# Patient Record
Sex: Male | Born: 1935 | Race: White | Hispanic: No | Marital: Married | Smoking: Never smoker
Health system: Southern US, Community
[De-identification: ages and names within clinical notes are randomized; demographics above are authoritative.]

## PROBLEM LIST (undated history)

## (undated) DIAGNOSIS — C61 Malignant neoplasm of prostate: Secondary | ICD-10-CM

## (undated) DIAGNOSIS — I519 Heart disease, unspecified: Secondary | ICD-10-CM

## (undated) DIAGNOSIS — E785 Hyperlipidemia, unspecified: Secondary | ICD-10-CM

## (undated) DIAGNOSIS — I4891 Unspecified atrial fibrillation: Secondary | ICD-10-CM

## (undated) DIAGNOSIS — I2510 Atherosclerotic heart disease of native coronary artery without angina pectoris: Secondary | ICD-10-CM

## (undated) DIAGNOSIS — I10 Essential (primary) hypertension: Secondary | ICD-10-CM

## (undated) DIAGNOSIS — I219 Acute myocardial infarction, unspecified: Secondary | ICD-10-CM

## (undated) DIAGNOSIS — M8580 Other specified disorders of bone density and structure, unspecified site: Secondary | ICD-10-CM

## (undated) DIAGNOSIS — Z923 Personal history of irradiation: Secondary | ICD-10-CM

## (undated) HISTORY — PX: CARDIAC CATHETERIZATION: SHX172

## (undated) HISTORY — DX: Heart disease, unspecified: I51.9

## (undated) HISTORY — DX: Hyperlipidemia, unspecified: E78.5

## (undated) HISTORY — DX: Malignant neoplasm of prostate: C61

## (undated) HISTORY — DX: Essential (primary) hypertension: I10

## (undated) HISTORY — DX: Unspecified atrial fibrillation: I48.91

## (undated) HISTORY — DX: Atherosclerotic heart disease of native coronary artery without angina pectoris: I25.10

## (undated) HISTORY — PX: HERNIA REPAIR: SHX51

---

## 1996-12-27 DIAGNOSIS — I219 Acute myocardial infarction, unspecified: Secondary | ICD-10-CM

## 1996-12-27 HISTORY — DX: Acute myocardial infarction, unspecified: I21.9

## 2001-04-05 ENCOUNTER — Other Ambulatory Visit: Admission: RE | Admit: 2001-04-05 | Discharge: 2001-04-05 | Payer: Self-pay | Admitting: Urology

## 2008-09-25 HISTORY — PX: PROSTATE BIOPSY: SHX241

## 2008-10-17 ENCOUNTER — Ambulatory Visit (HOSPITAL_COMMUNITY): Admission: RE | Admit: 2008-10-17 | Discharge: 2008-10-17 | Payer: Self-pay | Admitting: Urology

## 2008-11-01 ENCOUNTER — Inpatient Hospital Stay (HOSPITAL_COMMUNITY): Admission: EM | Admit: 2008-11-01 | Discharge: 2008-11-04 | Payer: Self-pay | Admitting: Emergency Medicine

## 2008-11-01 ENCOUNTER — Ambulatory Visit: Payer: Self-pay | Admitting: Internal Medicine

## 2008-11-02 ENCOUNTER — Encounter (INDEPENDENT_AMBULATORY_CARE_PROVIDER_SITE_OTHER): Payer: Self-pay | Admitting: Cardiology

## 2009-05-05 ENCOUNTER — Ambulatory Visit (HOSPITAL_BASED_OUTPATIENT_CLINIC_OR_DEPARTMENT_OTHER): Admission: RE | Admit: 2009-05-05 | Discharge: 2009-05-05 | Payer: Self-pay | Admitting: Urology

## 2009-05-05 DIAGNOSIS — C61 Malignant neoplasm of prostate: Secondary | ICD-10-CM

## 2009-05-05 HISTORY — DX: Malignant neoplasm of prostate: C61

## 2009-05-05 HISTORY — PX: PROSTATE CRYOABLATION: SUR358

## 2010-12-03 ENCOUNTER — Ambulatory Visit: Payer: Self-pay | Admitting: Cardiology

## 2011-04-06 LAB — POCT I-STAT 4, (NA,K, GLUC, HGB,HCT)
Glucose, Bld: 92 mg/dL (ref 70–99)
HCT: 53 % — ABNORMAL HIGH (ref 39.0–52.0)
Potassium: 5 mEq/L (ref 3.5–5.1)

## 2011-05-11 NOTE — Op Note (Signed)
NAMEJANDIEL, [name]               ACCOUNT NO.:  [id number]   MEDICAL RECORD NO.:  [id number]          PATIENT TYPE:  AMB   LOCATION:  [facility]                         FACILITY:  [facility]   PHYSICIAN:  Sigmund I. Patsi Sears, M.D.DATE OF BIRTH:  1933-06-02   DATE OF PROCEDURE:  05/05/2009  DATE OF DISCHARGE:                               OPERATIVE REPORT   PREOPERATIVE DIAGNOSIS:  T1C adenocarcinoma of the prostate.   POSTOPERATIVE DIAGNOSIS:  T1C adenocarcinoma of the prostate.   OPERATION:  Cryotherapy of the prostate.   SURGEON:  S. Patsi Sears, M.D.   ANESTHESIA:  General LMA.   PREPARATION:  After appropriate preanesthesia, the patient is brought to  the operating room and placed on the operating room dorsal supine  position where general LMA anesthesia was induced.  He was then replaced  in the dorsal lithotomy where the perineum was prepped with Betadine  solution and draped in the usual fashion.   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  Biopsy  showed Gleason 7 adenocarcinoma of the prostate on multiple sites on the  left side.  Evaluation was negative for metastatic disease, but the  patient does have osteopenia, 3 cm abdominal aortic aneurysm, and  erectile dysfunction.  The patient had an MI in November with stent and  was on Coumadin, aspirin and Plavix.  He has recently been cleared by  Dr. Swaziland for general anesthesia for cryotherapy.   PROCEDURE:  A Foley catheter was placed.  Transrectal ultrasound was  accomplished and showed approximately 30 gram prostate.  Cryotherapy  probes were placed in five separate rows.  Following placement of the  cryotherapy probes, as well as Denonvilliers fascia probe, and sphincter  probe, cystoscopy was accomplished, and found the sphincter probe to be  in the urethra.  This was backed up outside the urethra.  A guidewire  was placed in the  bladder, and the warming device was placed through the  urethra into the bladder under fluoroscopic control.   The patient underwent two freeze thaw cycles.  The first thaw cycle was  active, and the second thaw cycle was passive.  At the end of the  procedure, the patient had been had B & O suppository placed.  The  warming device was left for 20 minutes after the procedure was finished,  and then was removed so the Foley catheter could be placed.  The patient  will be discharged with the catheter.  He is given IV Toradol prior to  awakening.  The patient will be restarted on his Plavix and aspirin  postoperatively when stable and he was taken to the recovery room in  good condition.     Sigmund I. Patsi Sears, M.D.  Electronically Signed    SIT/MEDQ  D:  05/05/2009  T:  05/05/2009  Job:  [id number]

## 2011-05-11 NOTE — Discharge Summary (Signed)
[name], [name]               ACCOUNT NO.:  [id number]   MEDICAL RECORD NO.:  [id number]          PATIENT TYPE:  INP   LOCATION:  2030                         FACILITY:  [facility]   PHYSICIAN:  Elmore Guise., M.D.DATE OF BIRTH:  06-02-1933   DATE OF ADMISSION:  11/01/2008  DATE OF DISCHARGE:  11/04/2008                               DISCHARGE SUMMARY   DISCHARGE DIAGNOSES:  1. Anterior myocardial infarction, status post emergent percutaneous      coronary intervention of the mid left anterior descending.  2. History of hypertension.  3. History of dyslipidemia.  4. Recently diagnosed prostate cancer.   HISTORY OF PRESENT ILLNESS:  [name] is a very pleasant 77 year old  white male who presented to the hospital with an anterior MI.  He was  taken emergently to the cath lab for treatment.   HOSPITAL COURSE:  The patient's hospital course was uncomplicated.  He  did have successful intervention of his mid LAD.  He tolerated that  procedure well.  He did have a postprocedure echo, which either showed  stunning or possible scar of the mid and distal anteroseptal and  periapical wall with akinesis noted in these areas.  His blood pressure  remained well controlled after 36 hours in the Coronary Care Unit.  He  was sent to the telemetry floor for further evaluation.  He has done  well.  He has been up and ambulatory without any problems.  No further  chest pain or shortness of breath.  His peak CPK was 3463 with an MB of  240.  There was no troponin at that drawl.  His next CPK was 1480 with  an MB of 61 and troponin of 50.48.  His cholesterol was 121 with an LDL  of 53, HDL of 57, and triglycerides of 121.  On discharge, his BUN and  creatinine were 13 and 0.8 with potassium level of 3.7.  his white count  was 7.5 with a hemoglobin of 12.9, and platelet count of 108.  The  patient seems to have been recovering very well.   He will be discharged home today to continue the  following medications:  1. Aspirin 325 mg daily.  2. Plavix 75 mg daily.  3. Metoprolol 50 mg half tablet twice daily.  4. Simvastatin 40 mg daily.  5. Coumadin 3 mg daily (the patient is to come by the office for      samples).  This was started because of his moderate size anterior      infarct with akinesis noted on his echo.  I did tell him he likely      would not need this long term.  6. I encouraged him to stop his amlodipine.  He has been off that with      good blood pressure control with blood pressure ranging from 100-      115 over 60-70.  He is to come by the office for samples.   He will set up appointment with Dr. Swaziland in 1 week for PT/INR as well  as post-hospital visit.  I did  discuss that he would likely need to  reschedule his upcoming prostate procedure because of his heart issues.  He does need to be on Plavix at least to complete 1 year.  At this time,  I anticipate him being on Coumadin anywhere from 4 weeks to 3  months.  This will be determined by Dr. Swaziland.  Unless he has any  further problems, he will be discharged home today to continue postcath  restrictions, and he was not started on an ACE inhibitor because of  history of hyperkalemia, on ACE inhibitor in the past.      Elmore Guise., M.D.  Electronically Signed     TWK/MEDQ  D:  11/04/2008  T:  11/04/2008  Job:  [id number]   cc:   Vonzell Schlatter. Patsi Sears, M.D.

## 2011-05-11 NOTE — Cardiovascular Report (Signed)
[name], [name]               ACCOUNT NO.:  [id number]   MEDICAL RECORD NO.:  [id number]          PATIENT TYPE:  INP   LOCATION:  2902                         FACILITY:  [facility]   PHYSICIAN:  Verne Carrow, MDDATE OF BIRTH:  12/05/1933   DATE OF PROCEDURE:  11/01/2008  DATE OF DISCHARGE:                            CARDIAC CATHETERIZATION   PRIMARY CARDIOLOGIST:  Peter M. Swaziland, M.D.   PROCEDURE PERFORMED:  1. Left heart catheterization.  2. Selective coronary angiography.  3. Measurement of left ventricular pressures.  4. Percutaneous coronary intervention with thrombectomy of the totally      occluded mid left anterior descending coronary artery followed by      balloon angioplasty and placement of a Promus drug-eluting stent in      the mid left anterior descending coronary artery.  5. Placement of an Angio-Seal femoral artery closure device in the      right femoral artery.   OPERATOR:  Verne Carrow, MD.   INDICATION:  Anterior ST elevation myocardial infarction.   FINDINGS:  1. The left main coronary artery bifurcates  into the circumflex, a      ramus intermedius, and the LAD.  There was a 40%-50% stenosis in      the distal portion of the left main coronary artery.  2. The left anterior descending has a 40% stenosis in the proximal      portion followed by 100% mid occlusion that is most likely      secondary to thrombus burden and a tight stenotic lesion.  There      are several large septal perforators that arise prior to the 100%      occlusion.  There is also a very high diagonal branch that most      likely represents a ramus intermedius.  This has serial 30% lesions      down the length of the vessel.  This is a moderate-sized vessel.  3. The circumflex coronary artery has a 50%-60% proximal stenosis as      well as a 40% stenosis in the mid portion of the circumflex.  This      gives off 3 small obtuse marginal branches.  The first obtuse  marginal is a small vessel that has a 30%-40% ostial stenosis.  4. The right coronary artery has a proximal 30% stenosis and a plaque      noted in the mid portion.  This bifurcates into the PDA and a      posterolateral branch.  There is an 80% stenosis noted in the      posterior descending artery.  5. No left ventricular angiogram was performed secondary to high-end      diastolic pressures.  6. Hemodynamic data, central aortic pressure 112/73, left ventricular      pressure 95/18, end diastolic pressure 28.   MEDICATIONS DURING PROCEDURE:  The patient received 325 mg of aspirin  and 600 mg of Plavix as well as 4000 units of heparin in the emergency  department.  After the diagnostic heart catheterization, we elected to  use an Angiomax drip during the  procedure.  The patient received no  sedation during the procedure.   DETAILS:  The patient was brought to the Heart Catheterization  Laboratory on an emergent basis after presenting to the emergency  department with complaints of chest pain for 4 hours.  The EKG was  consistent with an anterior ST elevation myocardial infarction.  After  signing informed consent, the right groin was prepped and draped in  sterile fashion.  1% lidocaine was used for local anesthesia.  The  modified Seldinger technique was used to insert a 6-French sheath in the  right femoral artery.  A 6-French JR4 diagnostic catheter was used to  selectively engage the right coronary artery.  The right coronary artery  was injected selectively.  At this point, a 6 Fr XB LAD 3.5 guiding  catheter was used to selectively engage the left main coronary artery.  Following the angiogram, we proceeded to move towards a percutaneous  coronary intervention.  The patient was started on an Angiomax drip.  A  Cougar intracoronary wire was passed down the length of the left  anterior descending artery to the apex.  A Fetch thrombectomy catheter  was used to extract thrombus from  the occlusion.  I then used a 2.5 x 12  mm apex balloon for predilatation of the lesion.  A 2.75 x 28 mm Promus  drug-eluting stent was then placed in the mid LAD.  A 3.0 x 15 mm  noncompliant balloon was used for postdilatation of the lesion.  The  prestenosis of the lesion was 100%; following the intervention, the  stenosis was 0%.   IMPRESSION:  1. Acute anterior myocardial infarction with 100% occlusion of the mid      left anterior descending coronary artery.  2. Nonobstructive disease noted in the other coronary arteries.  3. Successful percutaneous coronary intervention with placement of a      Promus drug-eluting stent in the mid left anterior descending      coronary artery.  4. Elevated left ventricular filling pressures.   RECOMMENDATIONS:  The patient will be admitted to the CCU and continued  on aspirin and Plavix.  I would like to finish his Angiomax bag while in  the CCU.  We did not assess his left ventricular function secondary to  the acute infarction and high filling pressures.  The patient should  have a surface echocardiogram performed tomorrow.  The care of this  patient will be transferred to [facility].      Verne Carrow, MD  Electronically Signed     CM/MEDQ  D:  11/02/2008  T:  11/02/2008  Job:  [id number]   cc:   Peter M. Swaziland, M.D.

## 2011-05-11 NOTE — H&P (Signed)
NAMEAUDREY, ELLER               ACCOUNT NO.:  [id number]   MEDICAL RECORD NO.:  [id number]          PATIENT TYPE:  INP   LOCATION:  2902                         FACILITY:  [facility]   PHYSICIAN:  Verne Carrow, MDDATE OF BIRTH:  July 19, 1933   DATE OF ADMISSION:  11/01/2008  DATE OF DISCHARGE:                              HISTORY & PHYSICAL   CHIEF COMPLAINT:  Code STEMI.   HISTORY OF PRESENT ILLNESS:  This is a 77 year old male with coronary  artery disease and previous MI on October 31, 1997, who underwent a  PTCA, did not have any stents, also has a past medical history of  hypertension, hyperlipidemia, and a recent diagnosis of prostate cancer,  who presents with right shoulder pain, diaphoresis, chest pain, and ST  elevation in leads I, aVL, V2 through V5 with reciprocal depression in  II, III, aVF and Q waves in leads III and aVF.  The patient first  experienced right shoulder pain around 2 p.m. on the date of admission  and it improved over the next couple of hours.  Then around 5 p.m., the  shoulder pain returned and also presented with a dull pressure chest  pain that persisted until around 9:30 p.m. when the patient's pain  substantially increased and the patient also developed tingling in his  right hand and diaphoresis.  The patient's family called EMS around 9:45  p.m. and the patient was brought to [facility].  The only  medication that the patient received prior to EMS transport was 1 dose  of ibuprofen around 6 p.m. at night.  The patient does not suffer from  chronic angina and had been followed by Dr. Peter Sw[place] of [facility]  [facility] and his last visit was in March 2009, at which time he had a  stress test, which was reportedly normal.   PAST MEDICAL HISTORY:  1. Coronary artery disease status post MI in 1998 with angioplasty of      one fully blocked artery and one partially blocked artery per the      patient's family record.  2. Recent  diagnosis of prostate cancer, supposed to undergo      cryoablation on November 15, 2008.  3. Hyperlipidemia.  4. Hypertension.  5. Recent diagnosis of hairline fracture at lumbar vertebrae, found on      bone scan.  Unsure of location and chronicity at this time.   MEDICATIONS:  1. P.o. Zocor 80 mg daily.  2. P.o. Norvasc 10 mg daily.  3. P.o. metoprolol 12.5 mg b.i.d.  4. P.o. aspirin 81 mg daily.   SOCIAL HISTORY:  [place]  wife and daughter.  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  He has 1 daughter, has never smoked, does not drink  alcohol, and exercises about 4 times per week.   FAMILY HISTORY:  Mother had coronary artery disease and brother had  coronary artery disease.  Otherwise, noncontributory.   REVIEW OF SYSTEMS:  As per HPI.   PHYSICAL EXAMINATION:  VITAL SIGNS:  After the catheterization, the  following vitals were taken:  Temperature 97.3, pulse is 62, respiratory  rate is 17, blood pressure 99/63, and O2 saturation 92% on room air and  97% on 2 L.   Post-Cath:  GENERAL:  No apparent distress.  CARDIOVASCULAR:  Borderline bradycardic.  Regular rhythm.  Normal S1 and  S2.  No murmurs, rubs, or gallops.  LUNGS:  Clear to auscultation bilaterally, auscultate anteriorly.  ABDOMEN:  Soft and nontender.  Normoactive bowel sounds.  EXTREMITIES:  No peripheral cyanosis, clubbing, or edema.  NEUROLOGIC:  Alert and oriented.  Cranial nerves II through XII grossly  intact.  Strength 5/5 bilateral upper and lower extremity.  HEENT:  Moist membranes mucosa.   LABORATORY DATA:  Admission CBC; white blood cell count of 7.9,  hemoglobin of 13.3, and platelets of 143.  Cardiac enzymes; initial CK  less than 1.0, initial CK-MB is 56.3, and initial troponins 0.05.   ASSESSMENT:  The patient presented with an acute anterolateral ST-  elevation myocardial infarction.  He was rushed emergently to  the Cath  Lab and underwent emergent catheterization.  A mid left anterior  descending coronary artery stenosis was found and a Promus drug eluting  stent was inserted.  Post-catheterization, the patient had good coronary  perfusion and tolerated the procedure well.  He was taken to the CCU to  be monitored closely.  The patient was chest pain free upon transfer,  and his vital signs were stable.   PLAN:  The patient will be observed in the CCU for post-MI  complications.  He will be continued on Plavix, aspirin, and a statin.  Because the patient is borderline bradycardic, a beta-blocker will not  be instituted at this time, but will be started once it is  hemodynamically safe.      Linward Foster, MD  Electronically Signed      Verne Carrow, MD  Electronically Signed    LW/MEDQ  D:  11/02/2008  T:  11/02/2008  Job:  [id number]   cc:   Verne Carrow, MD  Peter M. Sw[place]iland, M.D.

## 2011-07-07 ENCOUNTER — Other Ambulatory Visit: Payer: Self-pay | Admitting: *Deleted

## 2011-07-07 DIAGNOSIS — E785 Hyperlipidemia, unspecified: Secondary | ICD-10-CM

## 2011-07-23 ENCOUNTER — Encounter: Payer: Self-pay | Admitting: *Deleted

## 2011-07-27 ENCOUNTER — Encounter: Payer: Self-pay | Admitting: Cardiology

## 2011-07-27 ENCOUNTER — Ambulatory Visit (INDEPENDENT_AMBULATORY_CARE_PROVIDER_SITE_OTHER): Payer: Medicare Other | Admitting: Cardiology

## 2011-07-27 ENCOUNTER — Other Ambulatory Visit (INDEPENDENT_AMBULATORY_CARE_PROVIDER_SITE_OTHER): Payer: Medicare Other | Admitting: *Deleted

## 2011-07-27 DIAGNOSIS — E785 Hyperlipidemia, unspecified: Secondary | ICD-10-CM

## 2011-07-27 DIAGNOSIS — I519 Heart disease, unspecified: Secondary | ICD-10-CM | POA: Insufficient documentation

## 2011-07-27 DIAGNOSIS — I10 Essential (primary) hypertension: Secondary | ICD-10-CM

## 2011-07-27 DIAGNOSIS — I2510 Atherosclerotic heart disease of native coronary artery without angina pectoris: Secondary | ICD-10-CM

## 2011-07-27 LAB — BASIC METABOLIC PANEL
BUN: 15 mg/dL (ref 6–23)
CO2: 28 mEq/L (ref 19–32)
Chloride: 104 mEq/L (ref 96–112)
Creatinine, Ser: 0.9 mg/dL (ref 0.4–1.5)
GFR: 82.85 mL/min (ref >60.00)
Glucose, Bld: 100 mg/dL — ABNORMAL HIGH (ref 70–99)
Potassium: 5.4 mEq/L — ABNORMAL HIGH (ref 3.5–5.1)
Sodium: 143 mEq/L (ref 135–145)

## 2011-07-27 LAB — LIPID PANEL: VLDL: 15.6 mg/dL (ref 0.0–40.0)

## 2011-07-27 LAB — HEPATIC FUNCTION PANEL
ALT: 16 U/L (ref 0–53)
AST: 25 U/L (ref 0–37)
Albumin: 4.5 g/dL (ref 3.5–5.2)
Total Bilirubin: 0.8 mg/dL (ref 0.3–1.2)

## 2011-07-27 NOTE — Assessment & Plan Note (Signed)
Results of his lipid panel today are excellent. He will continue on his current therapy with simvastatin. We discussed the potential interaction with amlodipine but he has been on this therapy for a number of years and has never had any complications.

## 2011-07-27 NOTE — Progress Notes (Signed)
   [name] Date of Birth: 1933-11-06   History of Present Illness: [name] is seen today for followup. He states he has been doing very well and feels excellent. He is walking daily. He denies any symptoms of chest pain, shortness of breath, palpitations, or dizziness. He has lost 5 pounds since his last visit.  Current Outpatient Prescriptions on File Prior to Visit  Medication Sig Dispense Refill  . amLODipine (NORVASC) 10 MG tablet Take 10 mg by mouth daily.        Marland Kitchen aspirin 81 MG tablet Take 81 mg by mouth daily.        . calcium carbonate (OS-CAL) 600 MG TABS Take 600 mg by mouth 2 (two) times daily with a meal.        . Cholecalciferol (VITAMIN D3) 2000 UNITS TABS Take 2,000 Int'l Units by mouth daily.        . clopidogrel (PLAVIX) 75 MG tablet Take 75 mg by mouth daily.        . metoprolol tartrate (LOPRESSOR) 25 MG tablet Take 25 mg by mouth 2 (two) times daily.        . simvastatin (ZOCOR) 40 MG tablet Take 40 mg by mouth at bedtime.          Allergies  Allergen Reactions  . Ace Inhibitors Other (See Comments)    hyperkalemia    Past Medical History  Diagnosis Date  . Coronary artery disease     Nov 2009-MI-stenting of the mid LAD drug-eluding stent  . Hypertension   . Prostate ca     prostate cancer 11/09-tx with cryotherapy  . LV dysfunction   . Hyperlipidemia     Past Surgical History  Procedure Date  . Cardiac catheterization     Nov 2009-drug eluding stent to mid LAD  . Hernia repair     History  Smoking status  . Never Smoker   Smokeless tobacco  . Not on file    History  Alcohol Use No    Family History  Problem Relation Age of Onset  . Heart disease Mother   . Heart failure Mother   . Heart disease Brother     Review of Systems: As noted in history of present illness.  All other systems were reviewed and are negative.  Physical Exam: BP 124/80  Pulse 58  Ht 5\' 11"  (1.803 m)  Wt 173 lb 9.6 oz (78.744 kg)  BMI 24.21 kg/m2 He is  a pleasant white male in no acute distress. He is normocephalic, atraumatic. Pupils are equal round and reactive to light accommodation. Extraocular movements are full. Oropharynx is clear. Neck is supple without JVD, adenopathy, thyromegaly, or bruits. Lungs are clear. Cardiac exam reveals a regular rate and rhythm without gallop, murmur, or click. Abdomen is soft and nontender. He has no significant edema. Pedal pulses are good. He is alert and oriented x3. Cranial nerves II through XII are intact. LABORATORY DATA:   Assessment / Plan:

## 2011-07-27 NOTE — Assessment & Plan Note (Signed)
He remains asymptomatic. We will plan on scheduling him for a stress Myoview study in November. He will continue with his risk factor modification.

## 2011-07-27 NOTE — Assessment & Plan Note (Signed)
Blood pressure is well controlled on his current medical therapy. 

## 2011-07-27 NOTE — Patient Instructions (Signed)
We will call with the results of your lab work today.  Continue your current medications.  We will schedule you for a stress test in November.  I will see you again in 6 months.

## 2011-07-29 ENCOUNTER — Telehealth: Payer: Self-pay | Admitting: *Deleted

## 2011-07-29 NOTE — Telephone Encounter (Signed)
Message copied by Lorayne Bender on Thu Jul 29, 2011  9:59 AM ------      Message from: Swaziland, PETER M      Created: Tue Jul 27, 2011  7:14 PM       Chemistries are excellent except mildly elevated K+. Lipids are excellent.

## 2011-07-29 NOTE — Progress Notes (Signed)
lm

## 2011-07-29 NOTE — Telephone Encounter (Signed)
Notified of lab results. Will send copy to Dr. Zachery Dauer

## 2011-08-09 ENCOUNTER — Other Ambulatory Visit: Payer: Self-pay | Admitting: *Deleted

## 2011-08-09 MED ORDER — CLOPIDOGREL BISULFATE 75 MG PO TABS: 75.0000 mg | ORAL_TABLET | Freq: Every day | ORAL | Status: DC

## 2011-08-09 NOTE — Telephone Encounter (Signed)
escribe medication per fax request  

## 2011-09-28 LAB — DIFFERENTIAL
Basophils Absolute: 0
Basophils Relative: 0
Eosinophils Absolute: 0.1
Eosinophils Relative: 1
Lymphocytes Relative: 24
Lymphocytes Relative: 44
Lymphs Abs: 1.6
Lymphs Abs: 3.4
Monocytes Absolute: 0.9
Monocytes Relative: 11
Monocytes Relative: 12
Neutro Abs: 3.4
Neutro Abs: 4.3
Neutrophils Relative %: 43
Neutrophils Relative %: 64

## 2011-09-28 LAB — POCT CARDIAC MARKERS
CKMB, poc: <1 — ABNORMAL LOW
Myoglobin, poc: 56.3
Troponin i, poc: <0.05

## 2011-09-28 LAB — POCT I-STAT, CHEM 8
BUN: 27 — ABNORMAL HIGH
Calcium, Ion: 1.23
Chloride: 104
Creatinine, Ser: 0.7
Creatinine, Ser: 1.1
Glucose, Bld: 120 — ABNORMAL HIGH
HCT: 39
HCT: 41
Hemoglobin: 13.3
Hemoglobin: 13.9
Potassium: 3.2 — ABNORMAL LOW
Potassium: 3.6
Sodium: 120 — ABNORMAL LOW
Sodium: 140
TCO2: 19
TCO2: 27

## 2011-09-28 LAB — COMPREHENSIVE METABOLIC PANEL
ALT: 43
AST: 214 — ABNORMAL HIGH
Albumin: 3.4 — ABNORMAL LOW
Alkaline Phosphatase: 60
BUN: 11
CO2: 27
Calcium: 8.8
Chloride: 106
Creatinine, Ser: 0.82
GFR calc Af Amer: >60
GFR calc non Af Amer: >60
Glucose, Bld: 95
Potassium: 3.7
Sodium: 140
Total Bilirubin: 1
Total Protein: 6.2

## 2011-09-28 LAB — LIPID PANEL
LDL Cholesterol: 53
Total CHOL/HDL Ratio: 2.1
Triglycerides: 55
VLDL: 11

## 2011-09-28 LAB — CBC
HCT: 39
HCT: 39.4
HCT: 39.5
HCT: 40.2
Hemoglobin: 12.9 — ABNORMAL LOW
Hemoglobin: 13.2
Hemoglobin: 13.8
MCHC: 33.5
MCHC: 34.2
MCV: 97.1
MCV: 97.5
MCV: 98.3
MCV: 98.9
Platelets: 106 — ABNORMAL LOW
Platelets: 108 — ABNORMAL LOW
Platelets: 122 — ABNORMAL LOW
RBC: 4.01 — ABNORMAL LOW
RBC: 4.13 — ABNORMAL LOW
RDW: 12.9
RDW: 13.1
RDW: 13.3
WBC: 6.7
WBC: 7.5
WBC: 9.6

## 2011-09-28 LAB — BASIC METABOLIC PANEL
BUN: 13
Chloride: 108
Chloride: 108
GFR calc Af Amer: >60
GFR calc non Af Amer: >60
Glucose, Bld: 103 — ABNORMAL HIGH
Potassium: 3.7
Sodium: 141

## 2011-09-28 LAB — PROTIME-INR
INR: 1.1
Prothrombin Time: 14.4

## 2011-09-28 LAB — APTT: aPTT: 33

## 2011-09-28 LAB — CARDIAC PANEL(CRET KIN+CKTOT+MB+TROPI): Relative Index: 6.9 — ABNORMAL HIGH

## 2011-10-28 ENCOUNTER — Telehealth: Payer: Self-pay | Admitting: *Deleted

## 2011-10-28 DIAGNOSIS — I2510 Atherosclerotic heart disease of native coronary artery without angina pectoris: Secondary | ICD-10-CM

## 2011-10-28 NOTE — Telephone Encounter (Signed)
Called [name] to advise we would be scheduling his stress test. Gave instructions. Advised we would call him with the results within 2-3 days after procedure.

## 2011-11-08 ENCOUNTER — Other Ambulatory Visit: Payer: Self-pay | Admitting: *Deleted

## 2011-11-08 MED ORDER — AMLODIPINE BESYLATE 10 MG PO TABS: 10.0000 mg | ORAL_TABLET | Freq: Every day | ORAL | Status: DC

## 2011-11-08 MED ORDER — METOPROLOL TARTRATE 25 MG PO TABS: 25.0000 mg | ORAL_TABLET | Freq: Two times a day (BID) | ORAL | Status: DC

## 2011-11-08 MED ORDER — SIMVASTATIN 40 MG PO TABS: 40.0000 mg | ORAL_TABLET | Freq: Every day | ORAL | Status: DC

## 2011-11-08 NOTE — Telephone Encounter (Signed)
Requested refill on Simvastatin 40 mg; amlodipine 10 mg;metoprolol 25 mg BID. Sent to [facility]

## 2011-11-09 ENCOUNTER — Ambulatory Visit (HOSPITAL_COMMUNITY): Payer: Medicare Other | Attending: Cardiology | Admitting: Radiology

## 2011-11-09 VITALS — Ht 70.0 in | Wt 173.0 lb

## 2011-11-09 DIAGNOSIS — Z9861 Coronary angioplasty status: Secondary | ICD-10-CM | POA: Insufficient documentation

## 2011-11-09 DIAGNOSIS — I2510 Atherosclerotic heart disease of native coronary artery without angina pectoris: Secondary | ICD-10-CM

## 2011-11-09 DIAGNOSIS — E785 Hyperlipidemia, unspecified: Secondary | ICD-10-CM

## 2011-11-09 DIAGNOSIS — Z8249 Family history of ischemic heart disease and other diseases of the circulatory system: Secondary | ICD-10-CM | POA: Insufficient documentation

## 2011-11-09 DIAGNOSIS — I252 Old myocardial infarction: Secondary | ICD-10-CM | POA: Insufficient documentation

## 2011-11-09 DIAGNOSIS — I10 Essential (primary) hypertension: Secondary | ICD-10-CM | POA: Insufficient documentation

## 2011-11-09 MED ORDER — TECHNETIUM TC 99M TETROFOSMIN IV KIT
11.0000 | PACK | Freq: Once | INTRAVENOUS | Status: AC | PRN
  Administered 2011-11-09: 11 via INTRAVENOUS

## 2011-11-09 MED ORDER — TECHNETIUM TC 99M TETROFOSMIN IV KIT
33.0000 | PACK | Freq: Once | INTRAVENOUS | Status: AC | PRN
  Administered 2011-11-09: 33 via INTRAVENOUS

## 2011-11-09 NOTE — Progress Notes (Signed)
[name] 3 NUCLEAR MED [address] [place] [address] [phone]  Cardiology Nuclear Med Study  [name] is a 77 y.o. male [id number] February 08, 1933   Nuclear Med Background Indication for Stress Test:  Evaluation for Ischemia and Stent Patency History: 98 Angioplasty , 09 Echo:EF=40-45%,09 Heart Catheterization:Post Myocardial Infarction with Stent Placement of LAD and 10 Myocardial Perfusion Study:EF=56% with anterior/apical infarct and basal lateral infarct. Cardiac Risk Factors: Family History - CAD, Hypertension and Lipids  Symptoms:  none   Nuclear Pre-Procedure Caffeine/Decaff Intake:  None NPO After: 7:00pm   Lungs:  clear IV 0.9% NS with Angio Cath:  22g  IV Site: R Hand  IV Started by:  Miguel Parsons, RN  Chest Size (in):  40 Cup Size: n/a  Height: 5\' 10"  (1.778 m)  Weight:  173 lb (78.472 kg)  BMI:  Body mass index is 24.82 kg/(m^2). Tech Comments:  Lopressor held x 48 hrs    Nuclear Med Study 1 or 2 day study: 1 day  Stress Test Type:  Stress  Reading MD: Olga Millers, MD  Order Authorizing Provider:  Vonna Drafts  Resting Radionuclide: Technetium 92m Tetrofosmin  Resting Radionuclide Dose: 11.0 mCi   Stress Radionuclide:  Technetium 97m Tetrofosmin  Stress Radionuclide Dose: 33.0 mCi           Stress Protocol Rest HR: 66 Stress HR: 164  Rest BP: 120/91 Stress BP: 167/85  Exercise Time (min): 8:00 METS: 10.10   Predicted Max HR: 144 bpm % Max HR: 113.89 bpm Rate Pressure Product: 21308   Dose of Adenosine (mg):  n/a Dose of Lexiscan: n/a mg  Dose of Atropine (mg): n/a Dose of Dobutamine: n/a mcg/kg/min (at max HR)  Stress Test Technologist: Miguel Parsons, RN  Nuclear Technologist:  [name], CNMT     Rest Procedure:  Myocardial perfusion imaging was performed at rest 45 minutes following the intravenous administration of Technetium 10m Tetrofosmin. Rest ECG: NSR  Stress Procedure:  The patient  exercised for 8:00.  The patient stopped due to fatigue and target heart rate achieved and denied any chest pain.  There were no significant ST-T wave changes. Patient had frequent PAC's and PVC's. Technetium 62m Tetrofosmin was injected at peak exercise and myocardial perfusion imaging was performed after a brief delay. Stress ECG: No significant ST segment change suggestive of ischemia.  QPS Raw Data Images:  Acquisition technically good; mild LVE. Stress Images:  There is decreased uptake in the anteroseptal wall and apex. Rest Images:  There is decreased uptake in the anteroseptal wall and apex. Subtraction (SDS):  There is a fixed defect that is most consistent with a previous infarction. Transient Ischemic Dilatation (Normal <1.22):  0.99 Lung/Heart Ratio (Normal <0.45):  0.26  Quantitative Gated Spect Images QGS EDV:  134 ml QGS ESV:  74 ml QGS cine images:  Anteroseptal and apical hypokinesis. QGS EF: 44%  Impression Exercise Capacity:  Fair exercise capacity. BP Response:  Normal blood pressure response. Clinical Symptoms:  No chest pain. ECG Impression:  No significant ST segment change suggestive of ischemia. Comparison with Prior Nuclear Study: No images to compare  Overall Impression:  Abnormal stress nuclear study with a large, fixed anteroseptal and apical defect consistent with prior infarct; no ischemia.   Olga Millers

## 2012-04-17 ENCOUNTER — Other Ambulatory Visit: Payer: Self-pay | Admitting: Cardiology

## 2012-04-17 MED ORDER — CLOPIDOGREL BISULFATE 75 MG PO TABS: 75.0000 mg | ORAL_TABLET | Freq: Every day | ORAL | Status: DC

## 2012-11-10 ENCOUNTER — Encounter: Payer: Self-pay | Admitting: Cardiology

## 2012-11-10 ENCOUNTER — Ambulatory Visit (INDEPENDENT_AMBULATORY_CARE_PROVIDER_SITE_OTHER): Payer: Medicare Other | Admitting: Cardiology

## 2012-11-10 VITALS — BP 118/83 | HR 56 | Ht 61.2 in | Wt 173.8 lb

## 2012-11-10 DIAGNOSIS — E785 Hyperlipidemia, unspecified: Secondary | ICD-10-CM

## 2012-11-10 DIAGNOSIS — I519 Heart disease, unspecified: Secondary | ICD-10-CM

## 2012-11-10 DIAGNOSIS — I10 Essential (primary) hypertension: Secondary | ICD-10-CM

## 2012-11-10 DIAGNOSIS — I2510 Atherosclerotic heart disease of native coronary artery without angina pectoris: Secondary | ICD-10-CM

## 2012-11-10 LAB — BASIC METABOLIC PANEL
BUN: 15 mg/dL (ref 6–23)
CO2: 29 mEq/L (ref 19–32)
Chloride: 106 mEq/L (ref 96–112)
GFR: 82.57 mL/min (ref >60.00)
Glucose, Bld: 116 mg/dL — ABNORMAL HIGH (ref 70–99)
Potassium: 4.2 mEq/L (ref 3.5–5.1)
Sodium: 141 mEq/L (ref 135–145)

## 2012-11-10 LAB — HEPATIC FUNCTION PANEL
AST: 23 U/L (ref 0–37)
Albumin: 4.3 g/dL (ref 3.5–5.2)
Total Protein: 7.4 g/dL (ref 6.0–8.3)

## 2012-11-10 LAB — LIPID PANEL
Cholesterol: 153 mg/dL (ref 0–200)
VLDL: 14 mg/dL (ref 0.0–40.0)

## 2012-11-10 MED ORDER — SIMVASTATIN 40 MG PO TABS: 40.0000 mg | ORAL_TABLET | Freq: Every day | ORAL | Status: DC

## 2012-11-10 MED ORDER — CLOPIDOGREL BISULFATE 75 MG PO TABS: 75.0000 mg | ORAL_TABLET | Freq: Every day | ORAL | Status: DC

## 2012-11-10 MED ORDER — METOPROLOL TARTRATE 25 MG PO TABS: 25.0000 mg | ORAL_TABLET | Freq: Two times a day (BID) | ORAL | Status: DC

## 2012-11-10 MED ORDER — AMLODIPINE BESYLATE 10 MG PO TABS: 10.0000 mg | ORAL_TABLET | Freq: Every day | ORAL | Status: DC

## 2012-11-10 NOTE — Progress Notes (Signed)
[name] Date of Birth: Aug 03, 1933   History of Present Illness: [name] is seen today for followup. He has a history of an anterior myocardial infarction in November 2009. This was treated with a drug-eluting stent to the mid LAD. He continues to do very well. Denies any symptoms of chest pain, shortness of breath, or palpitations. He is walking for a half miles 5-6 days a week. He's had no other new health concerns. He had a stress Myoview study November 2012. He is able to walk for 8 minutes. He had no clinical symptoms. Images showed a fixed anterior septal and apical defect. There was no ischemia. Ejection fraction was 44%.  Current Outpatient Prescriptions on File Prior to Visit  Medication Sig Dispense Refill  . amLODipine (NORVASC) 10 MG tablet Take 1 tablet (10 mg total) by mouth daily.  90 tablet  3  . aspirin 81 MG tablet Take 81 mg by mouth daily.        . calcium carbonate (OS-CAL) 600 MG TABS Take 600 mg by mouth 2 (two) times daily with a meal.        . Cholecalciferol (VITAMIN D3) 2000 UNITS TABS Take 2,000 Int'l Units by mouth daily.        . clopidogrel (PLAVIX) 75 MG tablet Take 1 tablet (75 mg total) by mouth daily.  90 tablet  0  . metoprolol tartrate (LOPRESSOR) 25 MG tablet Take 1 tablet (25 mg total) by mouth 2 (two) times daily.  180 tablet  3  . simvastatin (ZOCOR) 40 MG tablet Take 1 tablet (40 mg total) by mouth at bedtime.  90 tablet  3    Allergies  Allergen Reactions  . Ace Inhibitors Other (See Comments)    hyperkalemia    Past Medical History  Diagnosis Date  . Coronary artery disease     Nov 2009-MI-stenting of the mid LAD drug-eluding stent  . Hypertension   . Prostate ca     prostate cancer 11/09-tx with cryotherapy  . LV dysfunction   . Hyperlipidemia     Past Surgical History  Procedure Date  . Cardiac catheterization     Nov 2009-drug eluding stent to mid LAD  . Hernia repair     History  Smoking status  . Never Smoker     Smokeless tobacco  . Not on file    History  Alcohol Use No    Family History  Problem Relation Age of Onset  . Heart disease Mother   . Heart failure Mother   . Heart disease Brother     Review of Systems: As noted in history of present illness.  All other systems were reviewed and are negative.  Physical Exam: BP 118/83  Pulse 56  Ht 5' 1.2" (1.554 m)  Wt 78.835 kg (173 lb 12.8 oz)  BMI 32.62 kg/m2 He is a pleasant white male in no acute distress. He is normocephalic, atraumatic. Pupils are equal round and reactive to light accommodation. Extraocular movements are full. Oropharynx is clear. Neck is supple without JVD, adenopathy, thyromegaly, or bruits. Lungs are clear. Cardiac exam reveals a regular rate and rhythm without gallop, murmur, or click. Abdomen is soft and nontender. No masses or bruits. He has no significant edema. Pedal pulses are good. He is alert and oriented x3. Cranial nerves II through XII are intact.  LABORATORY DATA: ECG today demonstrates sinus bradycardia with a rate of 54 beats per minute. There is left axis deviation. There is septal  infarct, old. Fasting blood work is pending today.  Assessment / Plan: 1. Coronary disease with remote anterior myocardial infarction treated with drug-eluting stent to the LAD. Asymptomatic. Myoview study one year ago showed no ischemia. Will continue medical therapy with aspirin, metoprolol, Plavix, and statin therapy. 2. Hypertension, controlled. 3. Hypercholesterolemia, on Zocor. We'll check fasting lipids and chemistries today. 4. Left ventricular dysfunction. Ejection fraction 44%. Unchanged from 2009.

## 2012-11-10 NOTE — Patient Instructions (Signed)
We will check lab work today and call with the results.  Continue your current therapy.  I will see you again in one year.

## 2013-11-14 ENCOUNTER — Telehealth: Payer: Self-pay | Admitting: Cardiology

## 2013-11-14 ENCOUNTER — Ambulatory Visit (INDEPENDENT_AMBULATORY_CARE_PROVIDER_SITE_OTHER): Payer: Medicare Other | Admitting: Cardiology

## 2013-11-14 ENCOUNTER — Telehealth: Payer: Self-pay | Admitting: *Deleted

## 2013-11-14 ENCOUNTER — Encounter: Payer: Self-pay | Admitting: Cardiology

## 2013-11-14 VITALS — BP 120/79 | HR 69 | Ht 68.0 in | Wt 165.0 lb

## 2013-11-14 DIAGNOSIS — I2510 Atherosclerotic heart disease of native coronary artery without angina pectoris: Secondary | ICD-10-CM

## 2013-11-14 DIAGNOSIS — E785 Hyperlipidemia, unspecified: Secondary | ICD-10-CM

## 2013-11-14 DIAGNOSIS — I10 Essential (primary) hypertension: Secondary | ICD-10-CM

## 2013-11-14 DIAGNOSIS — I4891 Unspecified atrial fibrillation: Secondary | ICD-10-CM

## 2013-11-14 LAB — HEPATIC FUNCTION PANEL
AST: 25 U/L (ref 0–37)
Albumin: 4.2 g/dL (ref 3.5–5.2)
Alkaline Phosphatase: 67 U/L (ref 39–117)
Bilirubin, Direct: 0.2 mg/dL (ref 0.0–0.3)
Total Protein: 7.2 g/dL (ref 6.0–8.3)

## 2013-11-14 LAB — CBC WITH DIFFERENTIAL/PLATELET
Basophils Absolute: 0 10*3/uL (ref 0.0–0.1)
Eosinophils Absolute: 0 10*3/uL (ref 0.0–0.7)
HCT: 49.1 % (ref 39.0–52.0)
Hemoglobin: 16.2 g/dL (ref 13.0–17.0)
Lymphs Abs: 1.8 10*3/uL (ref 0.7–4.0)
MCHC: 33.1 g/dL (ref 30.0–36.0)
MCV: 96.4 fl (ref 78.0–100.0)
Monocytes Absolute: 0.6 10*3/uL (ref 0.1–1.0)
Neutro Abs: 5.7 10*3/uL (ref 1.4–7.7)
Platelets: 180 10*3/uL (ref 150.0–400.0)
RDW: 14.1 % (ref 11.5–14.6)

## 2013-11-14 LAB — BASIC METABOLIC PANEL
Calcium: 10.2 mg/dL (ref 8.4–10.5)
Creatinine, Ser: 1 mg/dL (ref 0.4–1.5)
GFR: 79.42 mL/min (ref >60.00)
Sodium: 140 mEq/L (ref 135–145)

## 2013-11-14 LAB — LIPID PANEL
Cholesterol: 163 mg/dL (ref 0–200)
LDL Cholesterol: 77 mg/dL (ref 0–99)
Triglycerides: 107 mg/dL (ref 0.0–149.0)

## 2013-11-14 MED ORDER — AMLODIPINE BESYLATE 10 MG PO TABS: 10.0000 mg | ORAL_TABLET | Freq: Every day | ORAL | Status: DC

## 2013-11-14 MED ORDER — APIXABAN 5 MG PO TABS: 5.0000 mg | ORAL_TABLET | Freq: Two times a day (BID) | ORAL | Status: DC

## 2013-11-14 MED ORDER — METOPROLOL TARTRATE 50 MG PO TABS: 25.0000 mg | ORAL_TABLET | Freq: Two times a day (BID) | ORAL | Status: DC

## 2013-11-14 MED ORDER — SIMVASTATIN 40 MG PO TABS: 40.0000 mg | ORAL_TABLET | Freq: Every day | ORAL | Status: DC

## 2013-11-14 NOTE — Patient Instructions (Signed)
We will check lab work today and schedule you for an Echocardiogram  Start Eliquis 5 mg twice a day.  Stop ASA and Plavix.  I will see you in 3 months.

## 2013-11-14 NOTE — Progress Notes (Signed)
[name] Date of Birth: [birth year]   History of Present Illness: [name] is seen today for followup. He has a history of an anterior myocardial infarction in November 2009. This was treated with a drug-eluting stent to the mid LAD.  He had a stress Myoview study November 2012. He is able to walk for 8 minutes. He had no clinical symptoms. Images showed a fixed anterior septal and apical defect. There was no ischemia. Ejection fraction was 44%. On followup today he reports he is doing very well. He has had some pain in his right hip which has slowed his exercise but he is still exercising 4 miles a day. He denies any palpitations, dyspnea, fatigue, or chest pain.  Current Outpatient Prescriptions on File Prior to Visit  Medication Sig Dispense Refill  . calcium carbonate (OS-CAL) 600 MG TABS Take 600 mg by mouth 2 (two) times daily with a meal.        . Cholecalciferol (VITAMIN D3) 2000 UNITS TABS Take 2,000 Int'l Units by mouth daily.         No current facility-administered medications on file prior to visit.    Allergies  Allergen Reactions  . Ace Inhibitors Other (See Comments)    hyperkalemia    Past Medical History  Diagnosis Date  . Coronary artery disease     Nov 2009-MI-stenting of the mid LAD drug-eluding stent  . Hypertension   . Prostate ca     prostate cancer 11/09-tx with cryotherapy  . LV dysfunction   . Hyperlipidemia   . Atrial fibrillation     Past Surgical History  Procedure Laterality Date  . Cardiac catheterization      Nov 2009-drug eluding stent to mid LAD  . Hernia repair      History  Smoking status  . Never Smoker   Smokeless tobacco  . Not on file    History  Alcohol Use No    Family History  Problem Relation Age of Onset  . Heart disease Mother   . Heart failure Mother   . Heart disease Brother     Review of Systems: As noted in history of present illness.  All other systems were reviewed and are negative.  Physical  Exam: BP 120/79  Pulse 69  Ht 5\' 8"  (1.727 m)  Wt 165 lb (74.844 kg)  BMI 25.09 kg/m2 He is a pleasant white male in no acute distress. He is normocephalic, atraumatic. Pupils are equal round and reactive to light accommodation. Extraocular movements are full. Oropharynx is clear. Neck is supple without JVD, adenopathy, thyromegaly, or bruits. Lungs are clear. Cardiac exam reveals an irregular rate and rhythm without gallop, murmur, or click. Abdomen is soft and nontender. No masses or bruits. He has no significant edema. Pedal pulses are good. He is alert and oriented x3. Cranial nerves II through XII are intact.  LABORATORY DATA: ECG today demonstrates atrial fibrillation with controlled ventricular response of 69 beats per minute. There is left axis deviation with low voltage.  Assessment / Plan: 1. Coronary disease with remote anterior myocardial infarction treated with drug-eluting stent to the LAD. Asymptomatic. Myoview study 11/12 showed no ischemia. Will continue medical therapy with  metoprolol and statin therapy. 2. Hypertension, controlled. 3. Hypercholesterolemia, on Zocor. We'll check fasting lipids and chemistries today. 4. Left ventricular dysfunction. Ejection fraction 44%. Unchanged from 2009.  5. Atrial fibrillation. Rate is controlled on low-dose metoprolol. Patient is completely asymptomatic. [place] score is 3. I recommended anticoagulation. We  will place him on Eliquis 5 mg twice a day. We will stop his aspirin and Plavix. We will check chemistries and TSH today. We will schedule him for an echocardiogram. Given his lack of symptoms I recommended a rate control strategy only.

## 2013-11-14 NOTE — Telephone Encounter (Signed)
New messge     Saw Dr Swaziland this am---want him to know eliquis is not covered by insurance.  What are the other 2 choices so that he can have his ins see if they will cover it?

## 2013-11-14 NOTE — Telephone Encounter (Signed)
Returned call to patient he stated Eliquis will be too expensive.Will check with Dr.Jordan 11/15/13 and call him back.

## 2013-11-15 MED ORDER — AMLODIPINE BESYLATE 10 MG PO TABS: 10.0000 mg | ORAL_TABLET | Freq: Every day | ORAL | Status: DC

## 2013-11-15 MED ORDER — SIMVASTATIN 40 MG PO TABS: 40.0000 mg | ORAL_TABLET | Freq: Every day | ORAL | Status: DC

## 2013-11-15 MED ORDER — RIVAROXABAN 20 MG PO TABS: 20.0000 mg | ORAL_TABLET | Freq: Every day | ORAL | Status: DC

## 2013-11-15 MED ORDER — METOPROLOL TARTRATE 50 MG PO TABS: 25.0000 mg | ORAL_TABLET | Freq: Two times a day (BID) | ORAL | Status: DC

## 2013-11-15 NOTE — Telephone Encounter (Signed)
Returned call to patient spoke to Dr.Jordan he advised start Xarelto 20 mg daily.Samples left at 3rd floor front desk.

## 2013-11-15 NOTE — Telephone Encounter (Signed)
error 

## 2013-11-28 ENCOUNTER — Ambulatory Visit (HOSPITAL_COMMUNITY): Payer: Medicare Other | Attending: Cardiology | Admitting: Cardiology

## 2013-11-28 DIAGNOSIS — I252 Old myocardial infarction: Secondary | ICD-10-CM | POA: Insufficient documentation

## 2013-11-28 DIAGNOSIS — I10 Essential (primary) hypertension: Secondary | ICD-10-CM | POA: Insufficient documentation

## 2013-11-28 DIAGNOSIS — I2510 Atherosclerotic heart disease of native coronary artery without angina pectoris: Secondary | ICD-10-CM | POA: Insufficient documentation

## 2013-11-28 DIAGNOSIS — E785 Hyperlipidemia, unspecified: Secondary | ICD-10-CM | POA: Insufficient documentation

## 2013-11-28 DIAGNOSIS — I4891 Unspecified atrial fibrillation: Secondary | ICD-10-CM | POA: Insufficient documentation

## 2013-11-28 NOTE — Progress Notes (Signed)
Echo performed. 

## 2013-12-04 ENCOUNTER — Telehealth: Payer: Self-pay | Admitting: Cardiology

## 2013-12-04 NOTE — Telephone Encounter (Signed)
New Problem:  Kim from [facility] office is calling to see if the patient has any contraindications for a dental procedure. Selena Batten is requesting a call back.

## 2013-12-04 NOTE — Telephone Encounter (Signed)
Returned call to Selena Batten at [employer] office she stated patient is scheduled for a crown 12/10/13 and Dr.Reeves wanted to make sure no contraindications.Message sent to Dr.Jordan for advice.

## 2013-12-05 NOTE — Telephone Encounter (Signed)
No problem with any dental work. Does not need SBE prophylaxis.  Peter Swaziland MD, [facility]

## 2013-12-06 NOTE — Telephone Encounter (Signed)
Returned cal to [employer] Dr.Jordan advised no problem with any dental work.No SBE prophylaxis.

## 2014-01-14 ENCOUNTER — Other Ambulatory Visit (HOSPITAL_COMMUNITY): Payer: Self-pay | Admitting: Urology

## 2014-01-14 DIAGNOSIS — C61 Malignant neoplasm of prostate: Secondary | ICD-10-CM

## 2014-01-31 ENCOUNTER — Ambulatory Visit (HOSPITAL_COMMUNITY)
Admission: RE | Admit: 2014-01-31 | Discharge: 2014-01-31 | Disposition: A | Discharge status: Home / Self Care | Payer: Medicare Other | Source: Ambulatory Visit | Attending: Urology | Admitting: Urology

## 2014-01-31 DIAGNOSIS — C61 Malignant neoplasm of prostate: Secondary | ICD-10-CM

## 2014-01-31 DIAGNOSIS — K5730 Diverticulosis of large intestine without perforation or abscess without bleeding: Secondary | ICD-10-CM | POA: Insufficient documentation

## 2014-01-31 DIAGNOSIS — N402 Nodular prostate without lower urinary tract symptoms: Secondary | ICD-10-CM | POA: Insufficient documentation

## 2014-01-31 LAB — CREATININE, SERUM
Creatinine, Ser: 0.93 mg/dL (ref 0.50–1.35)
GFR, EST NON AFRICAN AMERICAN: 78 mL/min — AB (ref >90)

## 2014-01-31 MED ORDER — GADOBENATE DIMEGLUMINE 529 MG/ML IV SOLN
15.0000 mL | Freq: Once | INTRAVENOUS | Status: AC | PRN
  Administered 2014-01-31: 15 mL via INTRAVENOUS

## 2014-02-18 ENCOUNTER — Ambulatory Visit (INDEPENDENT_AMBULATORY_CARE_PROVIDER_SITE_OTHER): Payer: Medicare Other | Admitting: Cardiology

## 2014-02-18 ENCOUNTER — Encounter: Payer: Self-pay | Admitting: Cardiology

## 2014-02-18 VITALS — BP 131/88 | HR 57 | Ht 68.0 in | Wt 171.0 lb

## 2014-02-18 DIAGNOSIS — I10 Essential (primary) hypertension: Secondary | ICD-10-CM

## 2014-02-18 DIAGNOSIS — I4891 Unspecified atrial fibrillation: Secondary | ICD-10-CM

## 2014-02-18 DIAGNOSIS — I2510 Atherosclerotic heart disease of native coronary artery without angina pectoris: Secondary | ICD-10-CM

## 2014-02-18 DIAGNOSIS — I519 Heart disease, unspecified: Secondary | ICD-10-CM

## 2014-02-18 NOTE — Patient Instructions (Signed)
Continue your current therapy  I will see you in 6 months.   

## 2014-02-18 NOTE — Progress Notes (Signed)
[name] Date of Birth: November 07, 1933   History of Present Illness: [name] is seen today for followup. He has a history of an anterior myocardial infarction in November 2009. This was treated with a drug-eluting stent to the mid LAD.  He had a stress Myoview study November 2012. He is able to walk for 8 minutes. He had no clinical symptoms. Images showed a fixed anterior septal and apical defect. There was no ischemia. Ejection fraction was 44%. On his last visit in November he was found to be in atrial fibrillation with a controlled response. He was started on Xarelto. Echo showed EF 40-45% with moderate biatrial enlargement.On followup today he reports he is doing very well.He denies any palpitations, dyspnea, fatigue, or chest pain. He did note some bright red blood per rectum on one occasion 2 weeks ago. This was noted in the past and colonoscopy 3 years ago was normal. He does note his prostate CA has enlarged and he is planning to have 6 weeks of RT.  Current Outpatient Prescriptions on File Prior to Visit  Medication Sig Dispense Refill  . amLODipine (NORVASC) 10 MG tablet Take 1 tablet (10 mg total) by mouth daily.  90 tablet  3  . calcium carbonate (OS-CAL) 600 MG TABS Take 600 mg by mouth 2 (two) times daily with a meal.        . Cholecalciferol (VITAMIN D3) 2000 UNITS TABS Take 2,000 Int'l Units by mouth daily.        . metoprolol (LOPRESSOR) 50 MG tablet Take 0.5 tablets (25 mg total) by mouth 2 (two) times daily.  90 tablet  3  . Rivaroxaban (XARELTO) 20 MG TABS tablet Take 1 tablet (20 mg total) by mouth daily with supper.  90 tablet  3  . simvastatin (ZOCOR) 40 MG tablet Take 1 tablet (40 mg total) by mouth at bedtime.  90 tablet  3   No current facility-administered medications on file prior to visit.    No Active Allergies  Past Medical History  Diagnosis Date  . Coronary artery disease     Nov 2009-MI-stenting of the mid LAD drug-eluding stent  . Hypertension   .  Prostate ca     prostate cancer 11/09-tx with cryotherapy  . LV dysfunction   . Hyperlipidemia   . Atrial fibrillation     Past Surgical History  Procedure Laterality Date  . Cardiac catheterization      Nov 2009-drug eluding stent to mid LAD  . Hernia repair      History  Smoking status  . Never Smoker   Smokeless tobacco  . Not on file    History  Alcohol Use No    Family History  Problem Relation Age of Onset  . Heart disease Mother   . Heart failure Mother   . Heart disease Brother     Review of Systems: As noted in history of present illness.  All other systems were reviewed and are negative.  Physical Exam: BP 131/88  Pulse 57  Ht 5\' 8"  (1.727 m)  Wt 171 lb (77.565 kg)  BMI 26.01 kg/m2 He is a pleasant white male in no acute distress. HEENT is unremarkable. Neck is supple without JVD, adenopathy, thyromegaly, or bruits. Lungs are clear. Cardiac exam reveals an irregular rate and rhythm without gallop, murmur, or click. Abdomen is soft and nontender. No masses or bruits. He has no significant edema. Pedal pulses are good. He is alert and oriented x3. Cranial nerves  II through XII are intact.  LABORATORY DATA: Lab Results  Component Value Date   WBC 8.2 11/14/2013   HGB 16.2 11/14/2013   HCT 49.1 11/14/2013   PLT 180.0 11/14/2013   GLUCOSE 117* 11/14/2013   CHOL 163 11/14/2013   TRIG 107.0 11/14/2013   HDL 64.90 11/14/2013   LDLCALC 77 11/14/2013   ALT 18 11/14/2013   AST 25 11/14/2013   NA 140 11/14/2013   K 4.0 11/14/2013   CL 104 11/14/2013   CREATININE 0.93 01/31/2014   BUN 12 11/14/2013   CO2 30 11/14/2013   TSH 2.02 11/14/2013   INR 1.1 11/01/2008   Echo:Study Conclusions  - Left ventricle: The cavity size was normal. Wall thickness was increased in a pattern of mild LVH. Systolic function was mildly to moderately reduced. The estimated ejection fraction was in the range of 40% to 45%. Cannot exclude hypokinesis of the  mid-distalanteroseptal myocardium. - Mitral valve: Moderate regurgitation. - Left atrium: The atrium was moderately dilated. - Right ventricle: The cavity size was mildly dilated. - Right atrium: The atrium was moderately dilated. - Tricuspid valve: Moderate regurgitation. - Pulmonary arteries: PA peak pressure: 71mm Hg (S).   Assessment / Plan: 1. Coronary disease with remote anterior myocardial infarction treated with drug-eluting stent to the LAD. Asymptomatic. Myoview study 11/12 showed no ischemia. Will continue medical therapy with  metoprolol and statin therapy.  2. Hypertension, controlled.  3. Hypercholesterolemia, on Zocor. Controlled.  4. Left ventricular dysfunction. Ejection fraction 40-45%. Unchanged from 2009.   5. Atrial fibrillation. Rate is controlled on low-dose metoprolol. Patient is completely asymptomatic. [place] score is 3. Continue Xarelto 20 mg daily. Will need to monitor for further rectal bleeding especially with RT for his prostate CA.

## 2014-02-19 ENCOUNTER — Encounter: Payer: Self-pay | Admitting: Radiation Oncology

## 2014-02-19 NOTE — Progress Notes (Signed)
GU Location of Tumor / Histology: prostate  If Prostate Cancer, Gleason Score is (4 + 3) and PSA is (8.36 on 01/03/14)  Patient presented 1 months ago with signs/symptoms of: increasing PSA, to review MRI prostate results  Biopsies of prostate (if applicable) revealed: 0/73/7106 adenocarcinoma, 6/12 cores,Gleason 4+3=7  01/31/14  MRI Prostate:  IMPRESSION:  2 cm peripheral zone nodule in the left base and mid gland,  consistent with carcinoma. This shows extracapsular extension, left  seminal vesicle involvement, and left neurovascular bundle  involvement.  No evidence of lymphadenopathy.  Past/Anticipated interventions by urology, if any: every 4 months PSA surveillance  Past/Anticipated interventions by medical oncology, if any: none  Weight changes, if any: no  Bowel/Bladder complaints, if any:  IPSS 2,   nocturia x 2  Nausea/Vomiting, if any: no  Pain issues, if any:  no  SAFETY ISSUES:  Prior radiation? no  Pacemaker/ICD? no  Possible current pregnancy? na  Is the patient on methotrexate? no  Current Complaints / other details:  Married, retired English as a second language teacher, 1 daughter 05/05/2009   Cryotherapy of Prostate, Dr Gaynelle Arabian

## 2014-02-20 ENCOUNTER — Ambulatory Visit
Admission: RE | Admit: 2014-02-20 | Discharge: 2014-02-20 | Disposition: A | Discharge status: Home / Self Care | Payer: Medicare Other | Source: Ambulatory Visit | Attending: Radiation Oncology | Admitting: Radiation Oncology

## 2014-02-20 ENCOUNTER — Encounter: Payer: Self-pay | Admitting: Radiation Oncology

## 2014-02-20 VITALS — BP 130/87 | HR 73 | Temp 97.7°F | Resp 20 | Wt 173.2 lb

## 2014-02-20 DIAGNOSIS — Z79899 Other long term (current) drug therapy: Secondary | ICD-10-CM | POA: Insufficient documentation

## 2014-02-20 DIAGNOSIS — I4891 Unspecified atrial fibrillation: Secondary | ICD-10-CM | POA: Insufficient documentation

## 2014-02-20 DIAGNOSIS — C61 Malignant neoplasm of prostate: Secondary | ICD-10-CM

## 2014-02-20 DIAGNOSIS — M899 Disorder of bone, unspecified: Secondary | ICD-10-CM | POA: Insufficient documentation

## 2014-02-20 DIAGNOSIS — M949 Disorder of cartilage, unspecified: Secondary | ICD-10-CM

## 2014-02-20 DIAGNOSIS — I252 Old myocardial infarction: Secondary | ICD-10-CM | POA: Insufficient documentation

## 2014-02-20 DIAGNOSIS — E785 Hyperlipidemia, unspecified: Secondary | ICD-10-CM | POA: Insufficient documentation

## 2014-02-20 DIAGNOSIS — I2510 Atherosclerotic heart disease of native coronary artery without angina pectoris: Secondary | ICD-10-CM | POA: Insufficient documentation

## 2014-02-20 DIAGNOSIS — I10 Essential (primary) hypertension: Secondary | ICD-10-CM | POA: Insufficient documentation

## 2014-02-20 HISTORY — DX: Other specified disorders of bone density and structure, unspecified site: M85.80

## 2014-02-20 HISTORY — DX: Acute myocardial infarction, unspecified: I21.9

## 2014-02-20 NOTE — Progress Notes (Signed)
Please see the Nurse Progress Note in the MD Initial Consult Encounter for this patient. 

## 2014-02-20 NOTE — Progress Notes (Signed)
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 male who is a most pleasant 80 year old male who is seen today through the courtesy of Dr. Gaynelle Arabian for consideration of radiation therapy in the management of his recurrent high-risk adenocarcinoma prostate. He initially presented in 2009 with a PSA of 5.74 and a palpable nodule along the mid aspect of the prostate. Ultrasound-guided biopsies on 09/25/2008 revealed Gleason 7 (4+3) involving 60% of one core from left lateral base, a focus of recent 7 (4+3) from the left base with 60% of one core involvement from the left lateral mid gland, 60% of one core from the left mid gland and 40% of one core from the left apex. He also had Gleason 7 (3+4) involving 80% of one core from left lateral apex. In view of his medical comorbidities he elected for cryotherapy. He underwent cryotherapy on 05/05/2009. I believe that he reached a nadir of 1.24 by 02/10/2010. Since then his PSA has risen up to 8.36 on 01/03/2014. Dr. Gaynelle Arabian noted nodularity along the left base and obtained a MRI scan on 01/31/2014. He was found to have a 1.3 x 2.0 cm nodule along the left base and mid gland with extracapsular extension and involvement of left seminal vesicle and left neurovascular bundle. There was no evidence of adenopathy. He has not yet had a bone scan. He is doing well from a GU and GI standpoint. His I PSS score is 2. He does have erectile dysfunction. He remains relatively active and walks almost daily. Medical comorbidities include coronary artery disease/atrial fibrillation.  PREVIOUS RADIATION  THERAPY: No   PAST MEDICAL HISTORY:  has a past medical history of Coronary artery disease; Hypertension; LV dysfunction; Hyperlipidemia; Atrial fibrillation; Prostate ca (05/05/2009); MI (myocardial infarction) (1998); and Osteopenia.     PAST SURGICAL HISTORY:  Past Surgical History  Procedure Laterality Date  . Cardiac catheterization      Nov 2009-drug eluding stent to mid LAD  . Hernia repair      inguinal  . Prostate cryoablation  05/05/2009    Dr Gaynelle Arabian  . Prostate biopsy  09/25/2008    gleason 4+3=7     FAMILY HISTORY: family history includes Cancer in his father; Heart disease in his brother, mother, and sister; Heart failure in his mother; Hematuria in his father. His father died at age 36 from old age. He did receive seed implantation for prostate cancer. His mother died from cardiac disease and 68.    SOCIAL HISTORY:  reports that he has never smoked. He does not have any smokeless tobacco history on file. He reports that he does not drink alcohol or use illicit drugs. Married, one daughter who is an Optometrist. He is a retired English as a second language teacher.   ALLERGIES: Review of patient's allergies indicates no known allergies.   MEDICATIONS:  Current Outpatient Prescriptions  Medication Sig Dispense Refill  . amLODipine (NORVASC) 10 MG tablet Take 1 tablet (10 mg total) by mouth daily.  90 tablet  3  . calcium carbonate (OS-CAL) 600 MG TABS Take 600 mg by mouth 2 (two) times daily  with a meal.        . Cholecalciferol (VITAMIN D3) 2000 UNITS TABS Take 2,000 Int'l Units by mouth daily.        . metoprolol (LOPRESSOR) 50 MG tablet Take 0.5 tablets (25 mg total) by mouth 2 (two) times daily.  90 tablet  3  . Rivaroxaban (XARELTO) 20 MG TABS tablet Take 1 tablet (20 mg total) by mouth daily with supper.  90 tablet  3  . simvastatin (ZOCOR) 40 MG tablet Take 1 tablet (40 mg total) by mouth at bedtime.  90 tablet  3   No current facility-administered medications for this encounter.      REVIEW OF SYSTEMS:  Pertinent items are noted in HPI.    PHYSICAL EXAM:  weight is 173 lb 3.2 oz (78.563 kg). His oral temperature is 97.7 F (36.5 C). His blood pressure is 130/87 and his pulse is 73. His respiration is 20.   Alert and oriented 79 year old white male appearing younger than his stated age. Head and neck examination: Grossly unremarkable. Chest: Lungs clear. Heart: irregular rhythm. Back: Without spinal or CVA tenderness. Abdomen: Without masses organomegaly. Genitalia: Unremarkable to inspection. Rectal: The gland is small. There is induration or nodularity along his left base and mid gland and I believe to involve the proximal left seminal vesicle. Extremities: Without edema.   LABORATORY DATA:  Lab Results  Component Value Date   WBC 8.2 11/14/2013   HGB 16.2 11/14/2013   HCT 49.1 11/14/2013   MCV 96.4 11/14/2013   PLT 180.0 11/14/2013   Lab Results  Component Value Date   NA 140 11/14/2013   K 4.0 11/14/2013   CL 104 11/14/2013   CO2 30 11/14/2013   Lab Results  Component Value Date   ALT 18 11/14/2013   AST 25 11/14/2013   ALKPHOS 67 11/14/2013   BILITOT 1.3* 11/14/2013   PSA 8.36 from 01/03/2014   IMPRESSION: Clinically recurrent clinical stage T3b N0 MX adenocarcinoma of the prostate. I explained to the patient and his wife that his prognosis is related to his Gleason score, clinical stage and PSA level. Based on his clinical stage and Gleason score, he now has can be defined as "high-risk disease". I do not feel that repeat biopsies will change by management. The question at hand is whether he will live long enough to develop symptomatic metastatic disease to bone and/or require androgen deprivation therapy which would certainly affect the quality of his life. He's reasonably active, and I would offer him potentially curative therapy. This will also be expected to delay the need for androgen deprivation therapy in the event that he has disease  progression. We discussed 5 weeks of external beam followed by seed implantation versus 8 weeks of external beam/IMRT. Because of his extracapsular disease and  involvement of the left seminal vesicle, he would not be a good candidate for seed implant boost. Therefore, I recommend external beam/IMRT with consideration of 6-7 months of short-term androgen deprivation to improve his local regional control. We discussed the potential acute and late toxicities of radiation therapy and also short-term androgen deprivation therapy. I do not feel that short-term therapy we will increase his cardiac risk. I spoke with Dr. Arlyn Leak nurse Maudie Mercury) to have Dr. Gaynelle Arabian begin androgen deprivation therapy for a total of 6-7 months. We will also ask Dr. Gaynelle Arabian to place 3 gold markers for image guidance and this will require stoppage of his Xarelto for a brief period of time. We'll begin his  radiation therapy to one half to 3 months following initiation of androgen deprivation therapy. I'll see the patient for a followup visit in 2 months. In the meantime, we will have Dr. Gaynelle Arabian placed 3 gold seed markers. Lastly, I would like to obtain a bone scan just to make sure that he does not have metastatic to the bone. He can begin androgen deprivation therapy this coming week.   PLAN: As discussed above.  I spent 60 minutes minutes face to face with the patient and more than 50% of that time was spent in counseling and/or coordination of care.

## 2014-02-22 ENCOUNTER — Telehealth: Payer: Self-pay | Admitting: *Deleted

## 2014-02-22 NOTE — Telephone Encounter (Signed)
Called patient to inform of test and his visit with the doctor, lvm for a return call

## 2014-02-22 NOTE — Telephone Encounter (Signed)
CALLED PATIENT TO INFORM OF GOLD SEED PLACEMENT ON 03-19-14 @ 1:45 PM @ DR. TANNENBAUM'S OFFICE AND HIS TEST ON 03-06-14 AND HIS [facility] VISIT ON 04-23-14, SPOKE WITH PATIENT AND HE IS AWARE OF THESE APPTS.

## 2014-03-06 ENCOUNTER — Other Ambulatory Visit: Payer: Self-pay | Admitting: Radiation Oncology

## 2014-03-06 ENCOUNTER — Ambulatory Visit (HOSPITAL_COMMUNITY)
Admission: RE | Admit: 2014-03-06 | Discharge: 2014-03-06 | Disposition: A | Discharge status: Home / Self Care | Payer: Medicare Other | Source: Ambulatory Visit | Attending: Radiation Oncology | Admitting: Radiation Oncology

## 2014-03-06 ENCOUNTER — Telehealth: Payer: Self-pay | Admitting: *Deleted

## 2014-03-06 DIAGNOSIS — C61 Malignant neoplasm of prostate: Secondary | ICD-10-CM

## 2014-03-06 MED ORDER — TECHNETIUM TC 99M MEDRONATE IV KIT
26.8000 | PACK | Freq: Once | INTRAVENOUS | Status: AC | PRN
  Administered 2014-03-06: 26.8 via INTRAVENOUS

## 2014-03-06 NOTE — Telephone Encounter (Signed)
Called patient to ask about x-ray of T -spine tomorrow, spoke with patient and he will be in Radiology on 03-07-14 for these x-rays.

## 2014-03-07 ENCOUNTER — Ambulatory Visit (HOSPITAL_COMMUNITY)
Admission: RE | Admit: 2014-03-07 | Discharge: 2014-03-07 | Disposition: A | Discharge status: Home / Self Care | Payer: Medicare Other | Source: Ambulatory Visit | Attending: Radiation Oncology | Admitting: Radiation Oncology

## 2014-03-07 DIAGNOSIS — C61 Malignant neoplasm of prostate: Secondary | ICD-10-CM

## 2014-03-07 DIAGNOSIS — X58XXXA Exposure to other specified factors, initial encounter: Secondary | ICD-10-CM | POA: Insufficient documentation

## 2014-03-07 DIAGNOSIS — S22009A Unspecified fracture of unspecified thoracic vertebra, initial encounter for closed fracture: Secondary | ICD-10-CM | POA: Insufficient documentation

## 2014-04-22 NOTE — Progress Notes (Signed)
GU Location of Tumor / Histology: prostate   If Prostate Cancer, Gleason Score is (4 + 3) and PSA is (8.36 on 01/03/14)   Patient presented 1 months ago with signs/symptoms of: increasing PSA, to review MRI prostate results  Biopsies of prostate (if applicable) revealed: 3/32/9518 adenocarcinoma, 6/12 cores, Gleason 4+3=7  Volume 18.73 cc  01/31/14 MRI Prostate:  IMPRESSION:  2 cm peripheral zone nodule in the left base and mid gland,  consistent with carcinoma. This shows extracapsular extension, left  seminal vesicle involvement, and left neurovascular bundle  involvement.  No evidence of lymphadenopathy.  Past/Anticipated interventions by urology, if any: every 4 months PSA surveillance  Past/Anticipated interventions by medical oncology, if any: none  Weight changes, if any: no  Bowel/Bladder complaints, if any: IPSS 2, nocturia x 2  Nausea/Vomiting, if any: no  Pain issues, if any: no   SAFETY ISSUES:  Prior radiation? no  Pacemaker/ICD? no  Possible current pregnancy? na  Is the patient on methotrexate? No  Current Complaints / other details: Married, retired English as a second language teacher, 1 daughter  05/05/2009 Cryotherapy of Prostate, Dr Gaynelle Arabian  03/06/14 bone scan- abnormal activity within lower thoracic spine 03/07/14 Thoracic spine: T9 wedge compression  03/19/14 placement of 3 gold seed markers by Dr Gaynelle Arabian Pt had hormone injection last week of March 2015.  Having Hot flashes. No changes in bladder/urinary function, IPSS  2

## 2014-04-23 ENCOUNTER — Ambulatory Visit
Admission: RE | Admit: 2014-04-23 | Discharge: 2014-04-23 | Disposition: A | Discharge status: Home / Self Care | Payer: Medicare Other | Source: Ambulatory Visit | Attending: Radiation Oncology | Admitting: Radiation Oncology

## 2014-04-23 ENCOUNTER — Encounter: Payer: Self-pay | Admitting: Radiation Oncology

## 2014-04-23 VITALS — BP 112/76 | HR 69 | Temp 98.1°F | Resp 20 | Wt 172.7 lb

## 2014-04-23 DIAGNOSIS — C61 Malignant neoplasm of prostate: Secondary | ICD-10-CM | POA: Insufficient documentation

## 2014-04-23 NOTE — Progress Notes (Signed)
CC: Dr. Leighton Ruff, Dr. Katrine Coho  Diagnosis: Recurrent, clinical stage T3b N0 adenocarcinoma prostate  History: [name] is a pleasant 80 year old male who is seen today for review and scheduling of his radiation therapy in the management of his recurrent, clinical stage T3b adenocarcinoma prostate. I first saw the patient in consultation on 02/20/2014.He initially presented in 2009 with a PSA of 5.74 and a palpable nodule along the mid aspect of the prostate. Ultrasound-guided biopsies on 09/25/2008 revealed Gleason 7 (4+3) involving 60% of one core from left lateral base, a focus of recent 7 (4+3) from the left base with 60% of one core involvement from the left lateral mid gland, 60% of one core from the left mid gland and 40% of one core from the left apex. He also had Gleason 7 (3+4) involving 80% of one core from left lateral apex. In view of his medical comorbidities he elected for cryotherapy. He underwent cryotherapy on 05/05/2009. I believe that he reached a nadir of 1.24 by 02/10/2010. Since then his PSA has risen up to 8.36 on 01/03/2014. Dr. Gaynelle Arabian noted nodularity along the left base and obtained a MRI scan on 01/31/2014. He was found to have a 1.3 x 2.0 cm nodule along the left base and mid gland with extracapsular extension and involvement of left seminal vesicle and left neurovascular bundle. There was no evidence of adenopathy. A bone scan on 02/20/2014 showed abnormal activity along the lower thoracic spine felt her present a compression fracture. Plain films showed a wedge compression of T9. The patient elected for short term androgen deprivation therapy along with external beam/IMRT. Gold fiducial markers were placed on 03/19/2014. He began androgen deprivation therapy and late February. He initially received one month of [place] and then Depo-Lupron in late March. He is without new GU or GI difficulty. He does report mild fatigue and also hot flashes as  expected.   Physical examination: Alert and oriented. Filed Vitals:   04/23/14 0848  BP: 112/76  Pulse: 69  Temp: 98.1 F (36.7 C)  Resp: 20   Rectal examination: He remains raised induration along the left base and mid gland. There has been tumor regression.   Impression: Recurrent, clinical stageT3b adenocarcinoma prostate. Again, we discussed the potential acute and late toxicities of radiation therapy. We also discussed treating him with a comfortably full bladder. We discussed the simulation process. Consent is signed today.  Plan: As above.  30 minutes was spent face-to-face with the patient, primarily counseling the patient and coordinating his care .

## 2014-04-24 NOTE — Addendum Note (Signed)
Encounter addended by: Cevin Rubinstein Mintz Conn Trombetta, RN on: 04/24/2014  7:29 PM<BR>     Documentation filed: Charges VN

## 2014-05-06 ENCOUNTER — Ambulatory Visit
Admission: RE | Admit: 2014-05-06 | Discharge: 2014-05-06 | Disposition: A | Discharge status: Home / Self Care | Payer: Medicare Other | Source: Ambulatory Visit | Attending: Radiation Oncology | Admitting: Radiation Oncology

## 2014-05-06 DIAGNOSIS — Z510 Encounter for antineoplastic radiation therapy: Secondary | ICD-10-CM | POA: Insufficient documentation

## 2014-05-06 DIAGNOSIS — C61 Malignant neoplasm of prostate: Secondary | ICD-10-CM | POA: Insufficient documentation

## 2014-05-06 NOTE — Progress Notes (Signed)
Complex simulation/treatment planning note: The patient was taken to the CT simulator. He was placed supine. A VAC LOC immobilization device was constructed. A red rubber catheter was placed within the rectal vault. He was then catheterized and contrast instilled into the bladder/urethra. He was then scanned. There is a fair amount of stool and gas within the rectum, and after evacuation he was rescanned. The anatomy was satisfactory. I placed an isocenter within the prostate. The CT data set was sent to the MIM planning system I contoured his prostate, seminal vesicles, bladder, rectum, and inferior rectosigmoid colon. I'm prescribing 7800 cGy in 40 sessions utilizing 6 MV photons. The prostate PTV represents the prostate was 0.8 cm except for 0.5 cm along the rectum. The seminal vesicle PTV represents the seminal vesicles by 0.5 cm and these will receive 5600 cGy in 40 sessions. I am requesting a comfortably full bladder, and he will undergo daily cone beam CT, setting up to his 3 gold seeds.

## 2014-05-09 ENCOUNTER — Encounter: Payer: Self-pay | Admitting: Radiation Oncology

## 2014-05-09 DIAGNOSIS — Z510 Encounter for antineoplastic radiation therapy: Secondary | ICD-10-CM | POA: Diagnosis not present

## 2014-05-09 NOTE — Progress Notes (Signed)
IMRT simulation/treatment planning note: [name] completed his IMRT simulation/treatment planning in the management of his carcinoma the prostate. IMRT was chosen to decrease the risk for both acute and late bladder and rectal toxicity compared to conventional or 3-D conformal radiation therapy. Dose volume histograms were obtained for the target structures including the prostate and seminal vesicles and also avoidance structures including the bladder, rectum, and femoral heads. We met our departmental guidelines. I prescribing 7800 cGy in 40 sessions to the prostate PTV and 5600 cGy in 40 sessions to [name]. I requesting a comfortably full bladder and daily cone beam CT setting up to his 3 gold seeds.

## 2014-05-12 DIAGNOSIS — Z510 Encounter for antineoplastic radiation therapy: Secondary | ICD-10-CM | POA: Diagnosis not present

## 2014-05-15 ENCOUNTER — Ambulatory Visit
Admission: RE | Admit: 2014-05-15 | Discharge: 2014-05-15 | Disposition: A | Discharge status: Home / Self Care | Payer: Medicare Other | Source: Ambulatory Visit | Attending: Radiation Oncology | Admitting: Radiation Oncology

## 2014-05-15 DIAGNOSIS — Z510 Encounter for antineoplastic radiation therapy: Secondary | ICD-10-CM | POA: Diagnosis not present

## 2014-05-15 DIAGNOSIS — C61 Malignant neoplasm of prostate: Secondary | ICD-10-CM

## 2014-05-15 NOTE — Progress Notes (Signed)
The patient began his IMRT simulation today in the management of his carcinoma the prostate. He is being treated with dural VMAT arcs with dynamic MLCs. This corresponds to one set of IMRT treatment devices [id number]).

## 2014-05-15 NOTE — Progress Notes (Signed)
Routine of clinic reviewed.Informed that he will see Dr.Murray every Monday after radiation treatment except next week we will be closed so he will be seen Tuesday.Patient had 1 of 40 treatments.Explained most side effects related to bowel/bladder and fatigue.Knows to have full bladder for treatment.Keep imodium ad on hand for diarrhea and to continue exercise routine with in limits for fatigue.Patient states he walks with wife daily at the mall about 1 to 1.5 hours.Given Radiation Therapy and You Booklet and to focus on pelvis/rectum.

## 2014-05-16 ENCOUNTER — Ambulatory Visit
Admission: RE | Admit: 2014-05-16 | Discharge: 2014-05-16 | Disposition: A | Discharge status: Home / Self Care | Payer: Medicare Other | Source: Ambulatory Visit | Attending: Radiation Oncology | Admitting: Radiation Oncology

## 2014-05-16 DIAGNOSIS — Z510 Encounter for antineoplastic radiation therapy: Secondary | ICD-10-CM | POA: Diagnosis not present

## 2014-05-17 ENCOUNTER — Ambulatory Visit
Admission: RE | Admit: 2014-05-17 | Discharge: 2014-05-17 | Disposition: A | Discharge status: Home / Self Care | Payer: Medicare Other | Source: Ambulatory Visit | Attending: Radiation Oncology | Admitting: Radiation Oncology

## 2014-05-17 DIAGNOSIS — Z510 Encounter for antineoplastic radiation therapy: Secondary | ICD-10-CM | POA: Diagnosis not present

## 2014-05-21 ENCOUNTER — Ambulatory Visit
Admission: RE | Admit: 2014-05-21 | Discharge: 2014-05-21 | Disposition: A | Discharge status: Home / Self Care | Payer: Medicare Other | Source: Ambulatory Visit | Attending: Radiation Oncology | Admitting: Radiation Oncology

## 2014-05-21 ENCOUNTER — Encounter: Payer: Self-pay | Admitting: Radiation Oncology

## 2014-05-21 VITALS — BP 127/98 | HR 74 | Resp 16 | Wt 177.6 lb

## 2014-05-21 DIAGNOSIS — C61 Malignant neoplasm of prostate: Secondary | ICD-10-CM

## 2014-05-21 DIAGNOSIS — Z510 Encounter for antineoplastic radiation therapy: Secondary | ICD-10-CM | POA: Diagnosis not present

## 2014-05-21 NOTE — Progress Notes (Signed)
Diastolic bp slightly elevated. Reports taking bp medication this morning. Reports nocturia x 2. Denies dysuria or hematuria. Denies diarrhea. Denis fatigue or pain.

## 2014-05-21 NOTE — Progress Notes (Signed)
Weekly Management Note:  Site: Prostate Current Dose:  780  cGy Projected Dose: 7800  cGy  Narrative: The patient is seen today for routine under treatment assessment. CBCT/MVCT images/port films were reviewed. The chart was reviewed.   Bladder filling is excellent. No GU or GI difficulties.  Physical Examination:  Filed Vitals:   05/21/14 1154  BP: 127/98  Pulse: 74  Resp: 16  .  Weight: 177 lb 9.6 oz (80.559 kg). No change.  Impression: Tolerating radiation therapy well.  Plan: Continue radiation therapy as planned.

## 2014-05-22 ENCOUNTER — Ambulatory Visit
Admission: RE | Admit: 2014-05-22 | Discharge: 2014-05-22 | Disposition: A | Discharge status: Home / Self Care | Payer: Medicare Other | Source: Ambulatory Visit | Attending: Radiation Oncology | Admitting: Radiation Oncology

## 2014-05-22 DIAGNOSIS — Z510 Encounter for antineoplastic radiation therapy: Secondary | ICD-10-CM | POA: Diagnosis not present

## 2014-05-23 ENCOUNTER — Ambulatory Visit
Admission: RE | Admit: 2014-05-23 | Discharge: 2014-05-23 | Disposition: A | Discharge status: Home / Self Care | Payer: Medicare Other | Source: Ambulatory Visit | Attending: Radiation Oncology | Admitting: Radiation Oncology

## 2014-05-23 DIAGNOSIS — Z510 Encounter for antineoplastic radiation therapy: Secondary | ICD-10-CM | POA: Diagnosis not present

## 2014-05-24 ENCOUNTER — Ambulatory Visit
Admission: RE | Admit: 2014-05-24 | Discharge: 2014-05-24 | Disposition: A | Discharge status: Home / Self Care | Payer: Medicare Other | Source: Ambulatory Visit | Attending: Radiation Oncology | Admitting: Radiation Oncology

## 2014-05-24 DIAGNOSIS — Z510 Encounter for antineoplastic radiation therapy: Secondary | ICD-10-CM | POA: Diagnosis not present

## 2014-05-27 ENCOUNTER — Ambulatory Visit
Admission: RE | Admit: 2014-05-27 | Discharge: 2014-05-27 | Disposition: A | Discharge status: Home / Self Care | Payer: Medicare Other | Source: Ambulatory Visit | Attending: Radiation Oncology | Admitting: Radiation Oncology

## 2014-05-27 VITALS — BP 112/73 | HR 66 | Temp 98.3°F | Wt 176.7 lb

## 2014-05-27 DIAGNOSIS — Z510 Encounter for antineoplastic radiation therapy: Secondary | ICD-10-CM | POA: Diagnosis not present

## 2014-05-27 DIAGNOSIS — C61 Malignant neoplasm of prostate: Secondary | ICD-10-CM

## 2014-05-27 NOTE — Progress Notes (Signed)
Weekly Management Note:  Site: Prostate Current Dose:  1560  cGy Projected Dose: 7800  cGy  Narrative: The patient is seen today for routine under treatment assessment. CBCT/MVCT images/port films were reviewed. The chart was reviewed.   Bladder filling is excellent. No GU or GI difficulties.  Physical Examination:  Filed Vitals:   05/27/14 1158  BP: 112/73  Pulse: 66  Temp: 98.3 F (36.8 C)  .  Weight: 176 lb 11.2 oz (80.151 kg). No change.  Impression: Tolerating radiation therapy well.  Plan: Continue radiation therapy as planned.

## 2014-05-27 NOTE — Progress Notes (Signed)
Weekly assessment of radiation to prostate/SV.Completed 8 of 40 treatments.Denies pain.No urinary or bowel changes.

## 2014-05-28 ENCOUNTER — Ambulatory Visit
Admission: RE | Admit: 2014-05-28 | Discharge: 2014-05-28 | Disposition: A | Discharge status: Home / Self Care | Payer: Medicare Other | Source: Ambulatory Visit | Attending: Radiation Oncology | Admitting: Radiation Oncology

## 2014-05-28 DIAGNOSIS — Z510 Encounter for antineoplastic radiation therapy: Secondary | ICD-10-CM | POA: Diagnosis not present

## 2014-05-29 ENCOUNTER — Ambulatory Visit
Admission: RE | Admit: 2014-05-29 | Discharge: 2014-05-29 | Disposition: A | Discharge status: Home / Self Care | Payer: Medicare Other | Source: Ambulatory Visit | Attending: Radiation Oncology | Admitting: Radiation Oncology

## 2014-05-29 DIAGNOSIS — Z510 Encounter for antineoplastic radiation therapy: Secondary | ICD-10-CM | POA: Diagnosis not present

## 2014-05-30 ENCOUNTER — Ambulatory Visit
Admission: RE | Admit: 2014-05-30 | Discharge: 2014-05-30 | Disposition: A | Discharge status: Home / Self Care | Payer: Medicare Other | Source: Ambulatory Visit | Attending: Radiation Oncology | Admitting: Radiation Oncology

## 2014-05-30 DIAGNOSIS — Z510 Encounter for antineoplastic radiation therapy: Secondary | ICD-10-CM | POA: Diagnosis not present

## 2014-05-31 ENCOUNTER — Ambulatory Visit
Admission: RE | Admit: 2014-05-31 | Discharge: 2014-05-31 | Disposition: A | Discharge status: Home / Self Care | Payer: Medicare Other | Source: Ambulatory Visit | Attending: Radiation Oncology | Admitting: Radiation Oncology

## 2014-05-31 DIAGNOSIS — Z510 Encounter for antineoplastic radiation therapy: Secondary | ICD-10-CM | POA: Diagnosis not present

## 2014-06-03 ENCOUNTER — Ambulatory Visit
Admission: RE | Admit: 2014-06-03 | Discharge: 2014-06-03 | Disposition: A | Discharge status: Home / Self Care | Payer: Medicare Other | Source: Ambulatory Visit | Attending: Radiation Oncology | Admitting: Radiation Oncology

## 2014-06-03 ENCOUNTER — Encounter: Payer: Self-pay | Admitting: Radiation Oncology

## 2014-06-03 VITALS — BP 129/84 | HR 74 | Temp 97.7°F | Resp 20 | Wt 175.9 lb

## 2014-06-03 DIAGNOSIS — C61 Malignant neoplasm of prostate: Secondary | ICD-10-CM

## 2014-06-03 DIAGNOSIS — Z510 Encounter for antineoplastic radiation therapy: Secondary | ICD-10-CM | POA: Diagnosis not present

## 2014-06-03 NOTE — Progress Notes (Signed)
Pt denies pain, urinary/bowel issues, fatigue, loss of appetite. He and his wife are walking daily.

## 2014-06-03 NOTE — Progress Notes (Signed)
   Weekly Management Note:  Outpatient Current Dose:  25.35 Gy  Projected Dose: 78 Gy   Narrative:  The patient presents for routine under treatment assessment.  CBCT/MVCT images/Port film x-rays were reviewed.  The chart was checked. He is doing well. No complaints, exercising with wife.  Physical Findings:  weight is 175 lb 14.4 oz (79.788 kg). His oral temperature is 97.7 F (36.5 C). His blood pressure is 129/84 and his pulse is 74. His respiration is 20.  NAD well appearing  Impression:  The patient is tolerating radiotherapy.  Plan:  Continue radiotherapy as planned.   ________________________________   Eppie Gibson, M.D.

## 2014-06-04 ENCOUNTER — Ambulatory Visit
Admission: RE | Admit: 2014-06-04 | Discharge: 2014-06-04 | Disposition: A | Discharge status: Home / Self Care | Payer: Medicare Other | Source: Ambulatory Visit | Attending: Radiation Oncology | Admitting: Radiation Oncology

## 2014-06-04 DIAGNOSIS — Z510 Encounter for antineoplastic radiation therapy: Secondary | ICD-10-CM | POA: Diagnosis not present

## 2014-06-05 ENCOUNTER — Ambulatory Visit
Admission: RE | Admit: 2014-06-05 | Discharge: 2014-06-05 | Disposition: A | Discharge status: Home / Self Care | Payer: Medicare Other | Source: Ambulatory Visit | Attending: Radiation Oncology | Admitting: Radiation Oncology

## 2014-06-05 DIAGNOSIS — Z510 Encounter for antineoplastic radiation therapy: Secondary | ICD-10-CM | POA: Diagnosis not present

## 2014-06-06 ENCOUNTER — Ambulatory Visit
Admission: RE | Admit: 2014-06-06 | Discharge: 2014-06-06 | Disposition: A | Discharge status: Home / Self Care | Payer: Medicare Other | Source: Ambulatory Visit | Attending: Radiation Oncology | Admitting: Radiation Oncology

## 2014-06-06 DIAGNOSIS — Z510 Encounter for antineoplastic radiation therapy: Secondary | ICD-10-CM | POA: Diagnosis not present

## 2014-06-07 ENCOUNTER — Ambulatory Visit
Admission: RE | Admit: 2014-06-07 | Discharge: 2014-06-07 | Disposition: A | Discharge status: Home / Self Care | Payer: Medicare Other | Source: Ambulatory Visit | Attending: Radiation Oncology | Admitting: Radiation Oncology

## 2014-06-07 DIAGNOSIS — Z510 Encounter for antineoplastic radiation therapy: Secondary | ICD-10-CM | POA: Diagnosis not present

## 2014-06-10 ENCOUNTER — Ambulatory Visit
Admission: RE | Admit: 2014-06-10 | Discharge: 2014-06-10 | Disposition: A | Discharge status: Home / Self Care | Payer: Medicare Other | Source: Ambulatory Visit | Attending: Radiation Oncology | Admitting: Radiation Oncology

## 2014-06-10 VITALS — BP 121/77 | HR 96 | Temp 98.0°F | Resp 14 | Ht 68.0 in | Wt 176.9 lb

## 2014-06-10 DIAGNOSIS — Z510 Encounter for antineoplastic radiation therapy: Secondary | ICD-10-CM | POA: Diagnosis not present

## 2014-06-10 DIAGNOSIS — C61 Malignant neoplasm of prostate: Secondary | ICD-10-CM

## 2014-06-10 NOTE — Progress Notes (Signed)
Weekly Management Note:  Site: Prostate Current Dose:  3510  cGy Projected Dose: 7800  cGy  Narrative: The patient is seen today for routine under treatment assessment. CBCT/MVCT images/port films were reviewed. The chart was reviewed.   Bladder filling less than ideal today. No new GU or GI difficulties.  Physical Examination:  Filed Vitals:   06/10/14 1149  BP: 121/77  Pulse: 96  Temp: 98 F (36.7 C)  Resp: 14  .  Weight: 176 lb 14.4 oz (80.241 kg). No change.  Impression: Tolerating radiation therapy well. I encouraged him to improve his bladder filling.  Plan: Continue radiation therapy as planned.

## 2014-06-10 NOTE — Progress Notes (Signed)
Pt has no complaints.  States he has a well established exercise routine and isn't experiencing any fatigue or weakness at this time. Denies having any urinary frequency, urgency or hematuria. Reports getting up 2 times a night to urinate. Occasional Constipation which cleared up on its own and he states he is having regular bowel movements. The patient eats a regular, healthy diet.. Pt reports having no changes in his skin integrity and that it is warm and free of injury.

## 2014-06-11 ENCOUNTER — Ambulatory Visit
Admission: RE | Admit: 2014-06-11 | Discharge: 2014-06-11 | Disposition: A | Discharge status: Home / Self Care | Payer: Medicare Other | Source: Ambulatory Visit | Attending: Radiation Oncology | Admitting: Radiation Oncology

## 2014-06-11 DIAGNOSIS — Z510 Encounter for antineoplastic radiation therapy: Secondary | ICD-10-CM | POA: Diagnosis not present

## 2014-06-12 ENCOUNTER — Ambulatory Visit
Admission: RE | Admit: 2014-06-12 | Discharge: 2014-06-12 | Disposition: A | Discharge status: Home / Self Care | Payer: Medicare Other | Source: Ambulatory Visit | Attending: Radiation Oncology | Admitting: Radiation Oncology

## 2014-06-12 DIAGNOSIS — Z510 Encounter for antineoplastic radiation therapy: Secondary | ICD-10-CM | POA: Diagnosis not present

## 2014-06-13 ENCOUNTER — Ambulatory Visit
Admission: RE | Admit: 2014-06-13 | Discharge: 2014-06-13 | Disposition: A | Discharge status: Home / Self Care | Payer: Medicare Other | Source: Ambulatory Visit | Attending: Radiation Oncology | Admitting: Radiation Oncology

## 2014-06-13 DIAGNOSIS — Z510 Encounter for antineoplastic radiation therapy: Secondary | ICD-10-CM | POA: Diagnosis not present

## 2014-06-14 ENCOUNTER — Ambulatory Visit
Admission: RE | Admit: 2014-06-14 | Discharge: 2014-06-14 | Disposition: A | Discharge status: Home / Self Care | Payer: Medicare Other | Source: Ambulatory Visit | Attending: Radiation Oncology | Admitting: Radiation Oncology

## 2014-06-14 DIAGNOSIS — Z510 Encounter for antineoplastic radiation therapy: Secondary | ICD-10-CM | POA: Diagnosis not present

## 2014-06-17 ENCOUNTER — Ambulatory Visit
Admission: RE | Admit: 2014-06-17 | Discharge: 2014-06-17 | Disposition: A | Discharge status: Home / Self Care | Payer: Medicare Other | Source: Ambulatory Visit | Attending: Radiation Oncology | Admitting: Radiation Oncology

## 2014-06-17 VITALS — BP 129/75 | HR 73 | Temp 97.7°F | Ht 68.0 in | Wt 176.5 lb

## 2014-06-17 DIAGNOSIS — Z510 Encounter for antineoplastic radiation therapy: Secondary | ICD-10-CM | POA: Diagnosis not present

## 2014-06-17 DIAGNOSIS — C61 Malignant neoplasm of prostate: Secondary | ICD-10-CM

## 2014-06-17 NOTE — Progress Notes (Signed)
[name] has had 23 fractions to his prostate.  He denies pain, diarrhea/constipation, dysuria, hematuria and and increase in fatigue    He reports getting up twice per night to urinate. He reports that he is feeling an urge to urinate more often.  He reports that he has been walking 2 1/2 miles in the mornings with a full bladder and has noticed "dribbling" and wonders if he should stop walking.

## 2014-06-17 NOTE — Progress Notes (Signed)
Weekly Management Note:  Site: Prostate Current Dose:  4485  cGy Projected Dose:  7800  cGy  Narrative: The patient is seen today for routine under treatment assessment. CBCT/MVCT images/port films were reviewed. The chart was reviewed.   Bladder filling is satisfactory. No new GU or GI difficulty. He does have slight urinary urgency along with some stress incontinence when he has a full bladder.  Physical Examination:  Filed Vitals:   06/17/14 1153  BP: 129/75  Pulse: 73  Temp: 97.7 F (36.5 C)  .  Weight: 176 lb 8 oz (80.06 kg). No change.  Impression: Tolerating radiation therapy well.  Plan: Continue radiation therapy as planned.

## 2014-06-18 ENCOUNTER — Ambulatory Visit
Admission: RE | Admit: 2014-06-18 | Discharge: 2014-06-18 | Disposition: A | Discharge status: Home / Self Care | Payer: Medicare Other | Source: Ambulatory Visit | Attending: Radiation Oncology | Admitting: Radiation Oncology

## 2014-06-18 DIAGNOSIS — Z510 Encounter for antineoplastic radiation therapy: Secondary | ICD-10-CM | POA: Diagnosis not present

## 2014-06-19 ENCOUNTER — Ambulatory Visit
Admission: RE | Admit: 2014-06-19 | Discharge: 2014-06-19 | Disposition: A | Discharge status: Home / Self Care | Payer: Medicare Other | Source: Ambulatory Visit | Attending: Radiation Oncology | Admitting: Radiation Oncology

## 2014-06-19 DIAGNOSIS — Z510 Encounter for antineoplastic radiation therapy: Secondary | ICD-10-CM | POA: Diagnosis not present

## 2014-06-20 ENCOUNTER — Ambulatory Visit
Admission: RE | Admit: 2014-06-20 | Discharge: 2014-06-20 | Disposition: A | Discharge status: Home / Self Care | Payer: Medicare Other | Source: Ambulatory Visit | Attending: Radiation Oncology | Admitting: Radiation Oncology

## 2014-06-20 DIAGNOSIS — Z510 Encounter for antineoplastic radiation therapy: Secondary | ICD-10-CM | POA: Diagnosis not present

## 2014-06-21 ENCOUNTER — Telehealth: Payer: Self-pay | Admitting: *Deleted

## 2014-06-21 ENCOUNTER — Ambulatory Visit
Admission: RE | Admit: 2014-06-21 | Discharge: 2014-06-21 | Disposition: A | Discharge status: Home / Self Care | Payer: Medicare Other | Source: Ambulatory Visit | Attending: Radiation Oncology | Admitting: Radiation Oncology

## 2014-06-21 DIAGNOSIS — Z510 Encounter for antineoplastic radiation therapy: Secondary | ICD-10-CM | POA: Diagnosis not present

## 2014-06-21 NOTE — Telephone Encounter (Signed)
Spoke with patient and requested he arrive no later than 11:15 am on Mon June 29 th to see Dr Isidore Moos before radiation treatment due to Dr Isidore Moos being involved in specialty tx. Pt verbalized agreement, understanding.

## 2014-06-24 ENCOUNTER — Ambulatory Visit
Admission: RE | Admit: 2014-06-24 | Discharge: 2014-06-24 | Disposition: A | Discharge status: Home / Self Care | Payer: Medicare Other | Source: Ambulatory Visit | Attending: Radiation Oncology | Admitting: Radiation Oncology

## 2014-06-24 VITALS — BP 125/93 | HR 73 | Temp 97.7°F | Resp 20 | Wt 175.4 lb

## 2014-06-24 DIAGNOSIS — C61 Malignant neoplasm of prostate: Secondary | ICD-10-CM

## 2014-06-24 DIAGNOSIS — Z510 Encounter for antineoplastic radiation therapy: Secondary | ICD-10-CM | POA: Diagnosis not present

## 2014-06-24 NOTE — Progress Notes (Signed)
   Weekly Management Note:  outpatient Current Dose:  52.65 Gy  Projected Dose: 78 Gy   Narrative:  The patient presents for routine under treatment assessment.  CBCT/MVCT images/Port film x-rays were reviewed.  The chart was checked. NAD, no new complaints. Slight urgency of urine  Physical Findings:  weight is 175 lb 6.4 oz (79.561 kg). His oral temperature is 97.7 F (36.5 C). His blood pressure is 125/93 and his pulse is 73. His respiration is 20.  NAD  Impression:  The patient is tolerating radiotherapy.  Plan:  Continue radiotherapy as planned.   ________________________________   Eppie Gibson, M.D.

## 2014-06-24 NOTE — Progress Notes (Signed)
Patient denies pain, fatigue, loss of appetite, bowel issues. He reports urinary frequency, urgency, nocturia x 2-3.

## 2014-06-25 ENCOUNTER — Ambulatory Visit
Admission: RE | Admit: 2014-06-25 | Discharge: 2014-06-25 | Disposition: A | Discharge status: Home / Self Care | Payer: Medicare Other | Source: Ambulatory Visit | Attending: Radiation Oncology | Admitting: Radiation Oncology

## 2014-06-25 DIAGNOSIS — Z510 Encounter for antineoplastic radiation therapy: Secondary | ICD-10-CM | POA: Diagnosis not present

## 2014-06-26 ENCOUNTER — Ambulatory Visit
Admission: RE | Admit: 2014-06-26 | Discharge: 2014-06-26 | Disposition: A | Discharge status: Home / Self Care | Payer: Medicare Other | Source: Ambulatory Visit | Attending: Radiation Oncology | Admitting: Radiation Oncology

## 2014-06-26 DIAGNOSIS — Z510 Encounter for antineoplastic radiation therapy: Secondary | ICD-10-CM | POA: Diagnosis not present

## 2014-06-27 ENCOUNTER — Ambulatory Visit
Admission: RE | Admit: 2014-06-27 | Discharge: 2014-06-27 | Disposition: A | Discharge status: Home / Self Care | Payer: Medicare Other | Source: Ambulatory Visit | Attending: Radiation Oncology | Admitting: Radiation Oncology

## 2014-06-27 DIAGNOSIS — Z510 Encounter for antineoplastic radiation therapy: Secondary | ICD-10-CM | POA: Diagnosis not present

## 2014-07-01 ENCOUNTER — Ambulatory Visit
Admission: RE | Admit: 2014-07-01 | Discharge: 2014-07-01 | Disposition: A | Discharge status: Home / Self Care | Payer: Medicare Other | Source: Ambulatory Visit | Attending: Radiation Oncology | Admitting: Radiation Oncology

## 2014-07-01 VITALS — BP 138/92 | HR 70 | Temp 97.7°F | Resp 16 | Wt 177.4 lb

## 2014-07-01 DIAGNOSIS — C61 Malignant neoplasm of prostate: Secondary | ICD-10-CM

## 2014-07-01 DIAGNOSIS — Z510 Encounter for antineoplastic radiation therapy: Secondary | ICD-10-CM | POA: Diagnosis not present

## 2014-07-01 NOTE — Progress Notes (Signed)
He is currently in no pain. Pt reports he is able to continue his normal exercise routine.  Reports urinary frequency, which he reports has been ongoing and the force of his urine isn't as strong as it use to be. Pt states they urinate 2 - 3 times per night.  Pt reports, a soft formed bowel movement every once a day. The patient eats a regular, healthy diet.

## 2014-07-01 NOTE — Progress Notes (Signed)
Weekly Management Note:  Site: Prostate Current Dose:  6240  cGy Projected Dose: 7800  cGy  Narrative: The patient is seen today for routine under treatment assessment. CBCT/MVCT images/port films were reviewed. The chart was reviewed.   Filling is excellent. No new GU or GI difficulty.  Physical Examination:  Filed Vitals:   07/01/14 1151  BP: 138/92  Pulse: 70  Temp: 97.7 F (36.5 C)  Resp: 16  .  Weight: 177 lb 6.4 oz (80.468 kg). No change.  Impression: Tolerating radiation therapy well.  Plan: Continue radiation therapy as planned.

## 2014-07-02 ENCOUNTER — Ambulatory Visit
Admission: RE | Admit: 2014-07-02 | Discharge: 2014-07-02 | Disposition: A | Discharge status: Home / Self Care | Payer: Medicare Other | Source: Ambulatory Visit | Attending: Radiation Oncology | Admitting: Radiation Oncology

## 2014-07-02 DIAGNOSIS — Z510 Encounter for antineoplastic radiation therapy: Secondary | ICD-10-CM | POA: Diagnosis not present

## 2014-07-03 ENCOUNTER — Ambulatory Visit
Admission: RE | Admit: 2014-07-03 | Discharge: 2014-07-03 | Disposition: A | Discharge status: Home / Self Care | Payer: Medicare Other | Source: Ambulatory Visit | Attending: Radiation Oncology | Admitting: Radiation Oncology

## 2014-07-03 DIAGNOSIS — Z510 Encounter for antineoplastic radiation therapy: Secondary | ICD-10-CM | POA: Diagnosis not present

## 2014-07-04 ENCOUNTER — Ambulatory Visit
Admission: RE | Admit: 2014-07-04 | Discharge: 2014-07-04 | Disposition: A | Discharge status: Home / Self Care | Payer: Medicare Other | Source: Ambulatory Visit | Attending: Radiation Oncology | Admitting: Radiation Oncology

## 2014-07-04 DIAGNOSIS — Z510 Encounter for antineoplastic radiation therapy: Secondary | ICD-10-CM | POA: Diagnosis not present

## 2014-07-05 ENCOUNTER — Ambulatory Visit
Admission: RE | Admit: 2014-07-05 | Discharge: 2014-07-05 | Disposition: A | Discharge status: Home / Self Care | Payer: Medicare Other | Source: Ambulatory Visit | Attending: Radiation Oncology | Admitting: Radiation Oncology

## 2014-07-05 DIAGNOSIS — Z510 Encounter for antineoplastic radiation therapy: Secondary | ICD-10-CM | POA: Diagnosis not present

## 2014-07-08 ENCOUNTER — Encounter: Payer: Self-pay | Admitting: Radiation Oncology

## 2014-07-08 ENCOUNTER — Ambulatory Visit
Admission: RE | Admit: 2014-07-08 | Discharge: 2014-07-08 | Disposition: A | Discharge status: Home / Self Care | Payer: Medicare Other | Source: Ambulatory Visit | Attending: Radiation Oncology | Admitting: Radiation Oncology

## 2014-07-08 VITALS — BP 128/82 | HR 76 | Temp 97.5°F | Resp 20 | Wt 175.0 lb

## 2014-07-08 DIAGNOSIS — Z510 Encounter for antineoplastic radiation therapy: Secondary | ICD-10-CM | POA: Diagnosis not present

## 2014-07-08 DIAGNOSIS — C61 Malignant neoplasm of prostate: Secondary | ICD-10-CM

## 2014-07-08 NOTE — Progress Notes (Signed)
Weekly radiation prostate,37/40 completed, no c/o pain, dysuria, still some frequency voiding, "No different from last week" states paatient, regular bowel movements, appetite good, energy level  Slight tired end of the day, still maintaining regular exercises though, 11:51 AM

## 2014-07-08 NOTE — Progress Notes (Signed)
Weekly Management Note:  Site: Prostate Current Dose:  7215  cGy Projected Dose: 7800  cGy  Narrative: The patient is seen today for routine under treatment assessment. CBCT/MVCT images/port films were reviewed. The chart was reviewed.   Bladder filling is satisfactory but less than ideal. No new GU or GI difficulty.  Physical Examination:  Filed Vitals:   07/08/14 1148  BP: 128/82  Pulse: 76  Temp: 97.5 F (36.4 C)  Resp: 20  .  Weight: 175 lb (79.379 kg). No change.  Impression: Tolerating radiation therapy well. He will finish his treatment this Thursday and return to see me for a one-month followup visit.  Plan: Continue radiation therapy as planned.

## 2014-07-09 ENCOUNTER — Ambulatory Visit
Admission: RE | Admit: 2014-07-09 | Discharge: 2014-07-09 | Disposition: A | Discharge status: Home / Self Care | Payer: Medicare Other | Source: Ambulatory Visit | Attending: Radiation Oncology | Admitting: Radiation Oncology

## 2014-07-09 DIAGNOSIS — Z510 Encounter for antineoplastic radiation therapy: Secondary | ICD-10-CM | POA: Diagnosis not present

## 2014-07-10 ENCOUNTER — Ambulatory Visit
Admission: RE | Admit: 2014-07-10 | Discharge: 2014-07-10 | Disposition: A | Discharge status: Home / Self Care | Payer: Medicare Other | Source: Ambulatory Visit | Attending: Radiation Oncology | Admitting: Radiation Oncology

## 2014-07-10 DIAGNOSIS — Z510 Encounter for antineoplastic radiation therapy: Secondary | ICD-10-CM | POA: Diagnosis not present

## 2014-07-11 ENCOUNTER — Encounter: Payer: Self-pay | Admitting: Radiation Oncology

## 2014-07-11 ENCOUNTER — Ambulatory Visit
Admission: RE | Admit: 2014-07-11 | Discharge: 2014-07-11 | Disposition: A | Discharge status: Home / Self Care | Payer: Medicare Other | Source: Ambulatory Visit | Attending: Radiation Oncology | Admitting: Radiation Oncology

## 2014-07-11 DIAGNOSIS — Z510 Encounter for antineoplastic radiation therapy: Secondary | ICD-10-CM | POA: Diagnosis not present

## 2014-07-11 NOTE — Progress Notes (Signed)
[facility] End of Treatment Note  Name:Deontay MELANIE PELLOT  Date: 07/11/2014 RFF:[id number] DOB:08/09/1933   Status:outpatient    CC: [name], MD  Dr. Katrine Coho  REFERRING PHYSICIAN:   Dr. Katrine Coho   DIAGNOSIS: Recurrent Stage T3b  adenocarcinoma prostate  INDICATION FOR TREATMENT: Curative   TREATMENT DATES: 05/15/2014 through 07/11/2014                          SITE/DOSE:  Prostate   7800 cGy in 40 sessions, seminal vesicles 5600 cGy in 40 sessions                       BEAMS/ENERGY:   Dual ARC VMAT VMAT IMRT   with 6 MV photons             NARRATIVE:  The patient tolerated treatment well without significant GU or GI toxicity during his course of therapy. Of note is that he is on androgen deprivation therapy through Dr. Gaynelle Arabian.                          PLAN: Routine followup in one month. Patient instructed to call if questions or worsening complaints in interim.

## 2014-07-19 IMAGING — CR DG THORACIC SPINE 2V
4 series · 4 of 4 positions shown · non-contrast
Comparison: Nuclear bone scan of March 06, 2014.

CLINICAL DATA: Abnormal thoracic spine activity on nuclear bone
scan of March 06, 2014

EXAM:
THORACIC SPINE - 2 VIEW

[t t-spine a.p.]
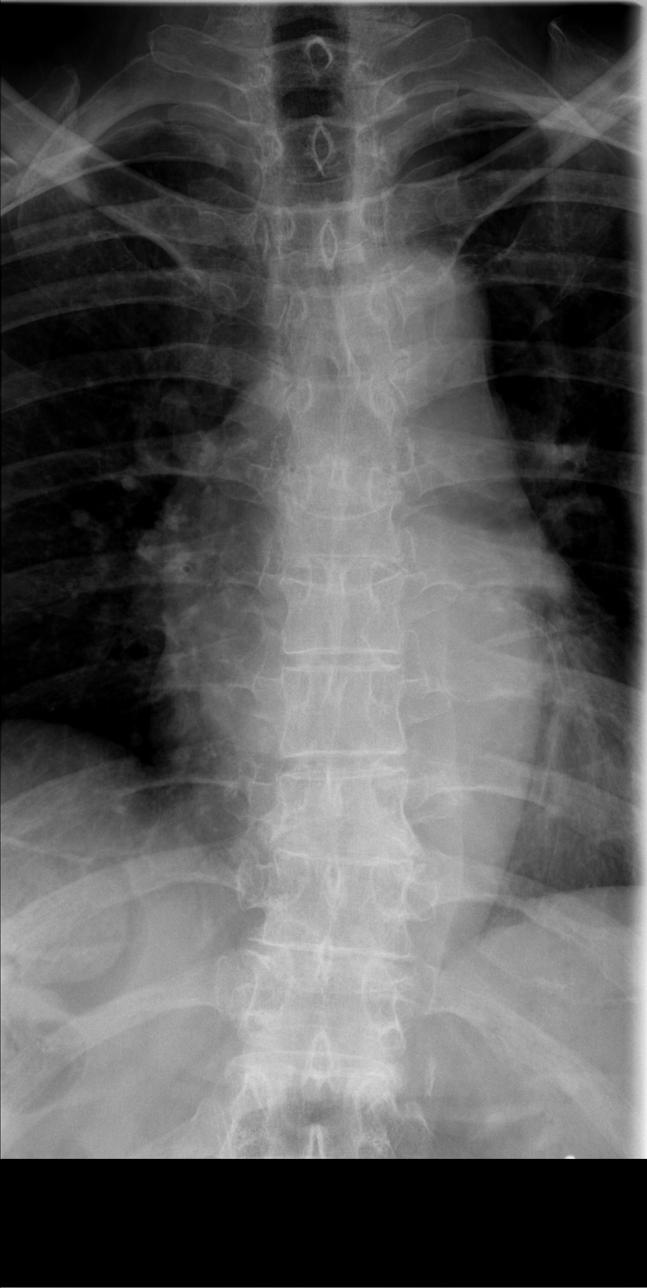

[t t-spine lat (1 of 2)]
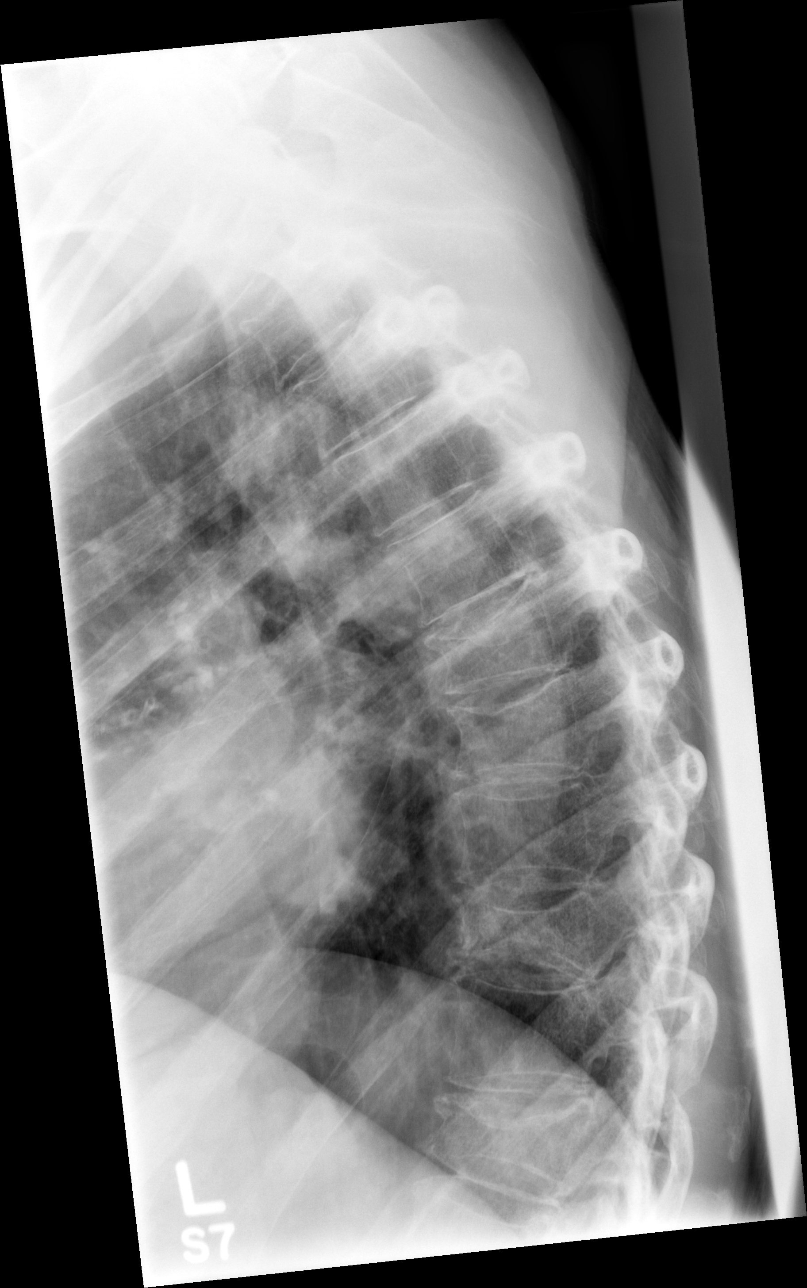

[t t-spine lat (2 of 2)]
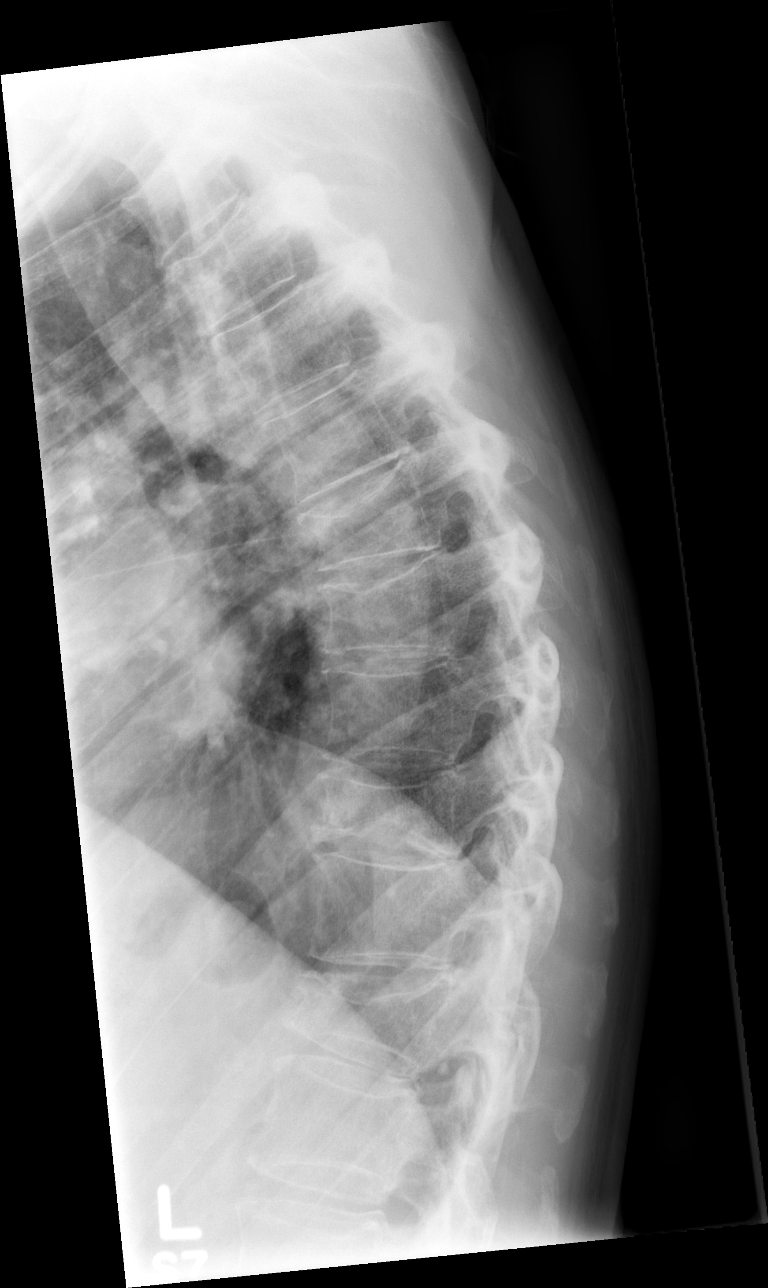

[t swimmers]
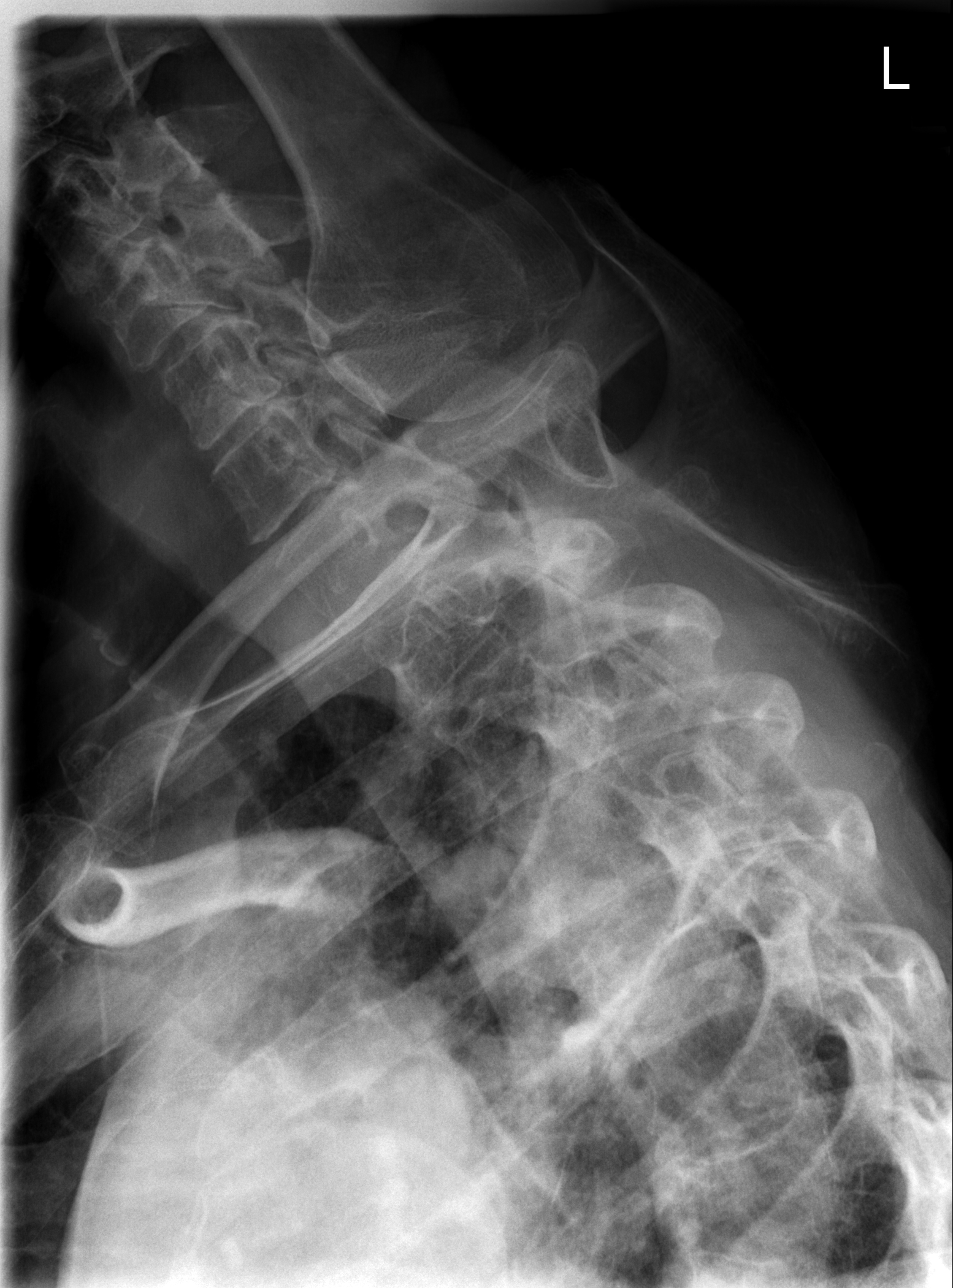

[4 of 4 positions shown; findings below may reference images not displayed]

FINDINGS: There are multiple thoracic compressions. Wedge compression of T11
with loss of height anteriorly of 50-60% is present. Wedge
compression of T9 with loss of height anteriorly of 50% is present.
There is wedge compression of T6 with loss of height of
approximately 50% and of T7 with loss of height of approximately
30%. Increased uptake on the nuclear bone bone scan corresponds to
the T9 compression.
IMPRESSION: 1. The abnormally increased activity on the recent nuclear bone scan
appears to be centered at T9 where there is wedge compression with
loss of height anteriorly of 50%.
2. Compression fractures of T6, T7, and T11 are present but do not
appear associated with increased activity on the recent nuclear bone
scan.

## 2014-08-13 ENCOUNTER — Encounter: Payer: Self-pay | Admitting: *Deleted

## 2014-08-13 ENCOUNTER — Ambulatory Visit
Admission: RE | Admit: 2014-08-13 | Discharge: 2014-08-13 | Disposition: A | Discharge status: Home / Self Care | Payer: Medicare Other | Source: Ambulatory Visit | Attending: Radiation Oncology | Admitting: Radiation Oncology

## 2014-08-13 VITALS — BP 131/96 | HR 85 | Temp 97.9°F | Resp 20 | Wt 180.0 lb

## 2014-08-13 DIAGNOSIS — C61 Malignant neoplasm of prostate: Secondary | ICD-10-CM

## 2014-08-13 HISTORY — DX: Personal history of irradiation: Z92.3

## 2014-08-13 NOTE — Progress Notes (Signed)
Patient denies pain, urinary/bowel issues, fatigue, loss of appetite. He has no FU with Dr Gaynelle Arabian scheduled at this time.

## 2014-08-13 NOTE — Progress Notes (Signed)
CC: Dr. Katrine Coho, Dr. Leighton Ruff  Followup note:  [name] returns today approximately 1 month following completion of radiation therapy in the management of his recurrent stage T3b adenocarcinoma prostate. He is doing well from a GU and GI standpoint. His fatigue is only minimal particularly in view of his ongoing androgen deprivation therapy. He is androgen deprivation therapy began in February. He is not yet have an appointment to see Dr. Gaynelle Arabian.  Physical examination: Alert and oriented. He looks well. Filed Vitals:   08/13/14 1548  BP: 131/96  Pulse: 85  Temp: 97.9 F (36.6 C)  Resp: 20   Rectal examination not performed today.  Impression: Satisfactory progress. Considering his age, think that a total of 10-12 months of androgen deprivation therapy would be more than satisfactory.  Plan: Followup with Dr. Gaynelle Arabian. I've not scheduled Mr. Langham for a formal followup visit and I ask that Dr. Gaynelle Arabian keep me posted on his progress.

## 2014-11-19 ENCOUNTER — Encounter: Payer: Self-pay | Admitting: Cardiology

## 2014-11-19 ENCOUNTER — Ambulatory Visit (INDEPENDENT_AMBULATORY_CARE_PROVIDER_SITE_OTHER): Payer: Medicare Other | Admitting: Cardiology

## 2014-11-19 VITALS — BP 120/100 | HR 62 | Ht 69.0 in | Wt 180.6 lb

## 2014-11-19 DIAGNOSIS — I2510 Atherosclerotic heart disease of native coronary artery without angina pectoris: Secondary | ICD-10-CM

## 2014-11-19 DIAGNOSIS — E785 Hyperlipidemia, unspecified: Secondary | ICD-10-CM

## 2014-11-19 DIAGNOSIS — I482 Chronic atrial fibrillation, unspecified: Secondary | ICD-10-CM

## 2014-11-19 DIAGNOSIS — I519 Heart disease, unspecified: Secondary | ICD-10-CM

## 2014-11-19 DIAGNOSIS — I10 Essential (primary) hypertension: Secondary | ICD-10-CM

## 2014-11-19 LAB — BASIC METABOLIC PANEL
BUN: 16 mg/dL (ref 6–23)
CALCIUM: 10.1 mg/dL (ref 8.4–10.5)
CO2: 27 mEq/L (ref 19–32)
CREATININE: 1.01 mg/dL (ref 0.50–1.35)
Chloride: 102 mEq/L (ref 96–112)
Glucose, Bld: 117 mg/dL — ABNORMAL HIGH (ref 70–99)
Potassium: 5.3 mEq/L (ref 3.5–5.3)
Sodium: 139 mEq/L (ref 135–145)

## 2014-11-19 LAB — LIPID PANEL
CHOLESTEROL: 161 mg/dL (ref 0–200)
HDL: 80 mg/dL (ref >39)
LDL Cholesterol: 66 mg/dL (ref 0–99)
TRIGLYCERIDES: 76 mg/dL (ref <150)
Total CHOL/HDL Ratio: 2 Ratio
VLDL: 15 mg/dL (ref 0–40)

## 2014-11-19 LAB — CBC WITH DIFFERENTIAL/PLATELET
BASOS PCT: 0 % (ref 0–1)
Basophils Absolute: 0 10*3/uL (ref 0.0–0.1)
EOS ABS: 0.1 10*3/uL (ref 0.0–0.7)
Eosinophils Relative: 1 % (ref 0–5)
HCT: 42.1 % (ref 39.0–52.0)
Hemoglobin: 14 g/dL (ref 13.0–17.0)
Lymphocytes Relative: 20 % (ref 12–46)
Lymphs Abs: 1.1 10*3/uL (ref 0.7–4.0)
MCH: 31.7 pg (ref 26.0–34.0)
MCHC: 33.3 g/dL (ref 30.0–36.0)
MCV: 95.2 fL (ref 78.0–100.0)
MONOS PCT: 11 % (ref 3–12)
MPV: 10.8 fL (ref 9.4–12.4)
Monocytes Absolute: 0.6 10*3/uL (ref 0.1–1.0)
Neutro Abs: 3.9 10*3/uL (ref 1.7–7.7)
Neutrophils Relative %: 68 % (ref 43–77)
Platelets: 152 10*3/uL (ref 150–400)
RBC: 4.42 MIL/uL (ref 4.22–5.81)
RDW: 14.2 % (ref 11.5–15.5)
WBC: 5.7 10*3/uL (ref 4.0–10.5)

## 2014-11-19 LAB — HEPATIC FUNCTION PANEL
ALK PHOS: 88 U/L (ref 39–117)
ALT: 11 U/L (ref 0–53)
AST: 22 U/L (ref 0–37)
Albumin: 4.5 g/dL (ref 3.5–5.2)
BILIRUBIN INDIRECT: 0.8 mg/dL (ref 0.2–1.2)
Bilirubin, Direct: 0.2 mg/dL (ref 0.0–0.3)
TOTAL PROTEIN: 7.3 g/dL (ref 6.0–8.3)
Total Bilirubin: 1 mg/dL (ref 0.2–1.2)

## 2014-11-19 MED ORDER — METOPROLOL TARTRATE 50 MG PO TABS: 25.0000 mg | ORAL_TABLET | Freq: Two times a day (BID) | ORAL | Status: DC

## 2014-11-19 MED ORDER — RIVAROXABAN 20 MG PO TABS: 20.0000 mg | ORAL_TABLET | Freq: Every day | ORAL | Status: DC

## 2014-11-19 MED ORDER — AMLODIPINE BESYLATE 10 MG PO TABS: 10.0000 mg | ORAL_TABLET | Freq: Every day | ORAL | Status: DC

## 2014-11-19 MED ORDER — SIMVASTATIN 40 MG PO TABS: 40.0000 mg | ORAL_TABLET | Freq: Every day | ORAL | Status: DC

## 2014-11-19 NOTE — Patient Instructions (Signed)
We will check lab work today  Continue your current therapy  I will see you in about 6 months.

## 2014-11-19 NOTE — Progress Notes (Signed)
[name] Date of Birth: [birth year]   History of Present Illness: [name] is seen today for followup. He has a history of an anterior myocardial infarction in November 2009. This was treated with a drug-eluting stent to the mid LAD.  He had a stress Myoview study November 2012. He is able to walk for 8 minutes. He had no clinical symptoms. Images showed a fixed anterior septal and apical defect. There was no ischemia. Ejection fraction was 44%. In November 2014 he was found to be in atrial fibrillation with a controlled response. He was started on Xarelto. Echo showed EF 40-45% with moderate biatrial enlargement.On followup today he reports he is doing very well.He denies any palpitations, dyspnea, fatigue, or chest pain. He has completed RT therapy for prostate CA. No bleeding. BP readings at home have been excellent.   Current Outpatient Prescriptions on File Prior to Visit  Medication Sig Dispense Refill  . calcium carbonate (OS-CAL) 600 MG TABS Take 600 mg by mouth 2 (two) times daily with a meal.      . Cholecalciferol (VITAMIN D3) 2000 UNITS TABS Take 2,000 Int'l Units by mouth daily.       No current facility-administered medications on file prior to visit.    No Known Allergies  Past Medical History  Diagnosis Date  . Coronary artery disease     Nov 2009-MI-stenting of the mid LAD drug-eluding stent  . Hypertension   . LV dysfunction   . Hyperlipidemia   . Atrial fibrillation   . Prostate ca 05/05/2009    prostate cancer 11/09-tx with cryotherapy, Dr Gaynelle Arabian  . MI (myocardial infarction) 1998  . Osteopenia   . Hx of radiation therapy 05/15/14- 07/11/14    prostate 7800 cGy 40 sessions, seminal vesicles 5600 cGy 40 sessions    Past Surgical History  Procedure Laterality Date  . Cardiac catheterization      Nov 2009-drug eluding stent to mid LAD  . Hernia repair      inguinal  . Prostate cryoablation  05/05/2009    Dr Gaynelle Arabian  . Prostate biopsy  09/25/2008   gleason 4+3=7    History  Smoking status  . Never Smoker   Smokeless tobacco  . Not on file    History  Alcohol Use No    Family History  Problem Relation Age of Onset  . Heart disease Mother   . Heart failure Mother   . Heart disease Brother   . Hematuria Father   . Cancer Father     prostate  . Heart disease Sister     Review of Systems: As noted in history of present illness.  All other systems were reviewed and are negative.  Physical Exam: BP 120/100 mmHg  Pulse 62  Ht 5\' 9"  (1.753 m)  Wt 180 lb 9.6 oz (81.92 kg)  BMI 26.66 kg/m2 He is a pleasant white male in no acute distress. HEENT is unremarkable. Neck is supple without JVD, adenopathy, thyromegaly, or bruits. Lungs are clear. Cardiac exam reveals an irregular rate and rhythm without gallop, murmur, or click. Abdomen is soft and nontender. No masses or bruits. He has no significant edema. Pedal pulses are good. He is alert and oriented x3. Cranial nerves II through XII are intact.  LABORATORY DATA: Lab Results  Component Value Date   WBC 8.2 11/14/2013   HGB 16.2 11/14/2013   HCT 49.1 11/14/2013   PLT 180.0 11/14/2013   GLUCOSE 117* 11/14/2013   CHOL 163 11/14/2013  TRIG 107.0 11/14/2013   HDL 64.90 11/14/2013   LDLCALC 77 11/14/2013   ALT 18 11/14/2013   AST 25 11/14/2013   NA 140 11/14/2013   K 4.0 11/14/2013   CL 104 11/14/2013   CREATININE 0.93 01/31/2014   BUN 12 11/14/2013   CO2 30 11/14/2013   TSH 2.02 11/14/2013   INR 1.1 11/01/2008   Ecg: atrial fibrillation rate 62 bpm. LAD. Old septal infarct. I have personally reviewed and interpreted this study.    Assessment / Plan: 1. Coronary disease with remote anterior myocardial infarction treated with drug-eluting stent to the LAD. Asymptomatic. Myoview study 11/12 showed no ischemia. Will continue medical therapy with  metoprolol and statin therapy. Ordinarily would get follow up myoview but he just finished RT for prostate CA so will  follow for now.   2. Hypertension, controlled.  3. Hypercholesterolemia, on Zocor. Controlled.  4. Left ventricular dysfunction. Ejection fraction 40-45%. Unchanged from 2009.   5. Atrial fibrillation. Rate is controlled on low-dose metoprolol. Patient is completely asymptomatic. [place] score is 3. Continue Xarelto 20 mg daily.

## 2015-01-23 ENCOUNTER — Telehealth: Payer: Self-pay | Admitting: Cardiology

## 2015-01-23 MED ORDER — RIVAROXABAN 20 MG PO TABS: 20.0000 mg | ORAL_TABLET | Freq: Every day | ORAL | Status: DC

## 2015-01-23 NOTE — Telephone Encounter (Signed)
Left message for patient, script sent to the pharmacy and samples at the front desk for pick up.

## 2015-01-23 NOTE — Addendum Note (Signed)
Addended by: Cristopher Estimable on: 01/23/2015 12:18 PM   Modules accepted: Orders

## 2015-01-23 NOTE — Telephone Encounter (Signed)
°  1. Which medications need to be refilled? Xarleto 20mg   2. Which pharmacy is medication to be sent to? [facility]   3. Do they need a 30 day or 90 day supply? 90   4. Would they like a call back once the medication has been sent to the pharmacy? yes  *Pt would like some samples until then until his prescription comes through*

## 2015-01-31 ENCOUNTER — Other Ambulatory Visit: Payer: Self-pay | Admitting: Cardiology

## 2015-01-31 MED ORDER — RIVAROXABAN 20 MG PO TABS: 20.0000 mg | ORAL_TABLET | Freq: Every day | ORAL | Status: DC

## 2015-01-31 NOTE — Telephone Encounter (Signed)
Pt called in stating that he normally gets a year supply of his  Xarelto medication but instead he only received one more refill. He would like for [facility] to call in more refills for him to make it through the year.  Thanks

## 2015-01-31 NOTE — Telephone Encounter (Signed)
Returned call to patient he stated he would like 90 day refill for xarelto and 3 refills.Refill sent to pharmacy.

## 2015-05-02 ENCOUNTER — Ambulatory Visit (INDEPENDENT_AMBULATORY_CARE_PROVIDER_SITE_OTHER): Payer: Medicare Other | Admitting: Cardiology

## 2015-05-02 ENCOUNTER — Encounter: Payer: Self-pay | Admitting: Cardiology

## 2015-05-02 VITALS — BP 123/85 | HR 74 | Wt 179.8 lb

## 2015-05-02 DIAGNOSIS — I2510 Atherosclerotic heart disease of native coronary artery without angina pectoris: Secondary | ICD-10-CM

## 2015-05-02 DIAGNOSIS — I519 Heart disease, unspecified: Secondary | ICD-10-CM

## 2015-05-02 DIAGNOSIS — I482 Chronic atrial fibrillation, unspecified: Secondary | ICD-10-CM

## 2015-05-02 DIAGNOSIS — I10 Essential (primary) hypertension: Secondary | ICD-10-CM

## 2015-05-02 NOTE — Progress Notes (Signed)
[name] Date of Birth: 03/06/1933   History of Present Illness: [name] is seen today for followup AFib. He has a history of an anterior myocardial infarction in November 2009. This was treated with a drug-eluting stent to the mid LAD.  He had a stress Myoview study November 2012. He is able to walk for 8 minutes. He had no clinical symptoms. Images showed a fixed anterior septal and apical defect. There was no ischemia. Ejection fraction was 44%.  In November 2014 he was found to be in atrial fibrillation with a controlled response. He was started on Xarelto. Echo showed EF 40-45% with moderate biatrial enlargement. He reports that 2 weeks ago he fell down stairs accidentally and cracked ribs on his left side. Still having quite a bit of pain. Prior to this he was exercising an hour a day. No angina or dyspnea. No palpitations. He is s/p RT for prostate CA and is doing well.   Current Outpatient Prescriptions on File Prior to Visit  Medication Sig Dispense Refill  . amLODipine (NORVASC) 10 MG tablet Take 1 tablet (10 mg total) by mouth daily. 90 tablet 3  . calcium carbonate (OS-CAL) 600 MG TABS Take 600 mg by mouth 2 (two) times daily with a meal.      . Cholecalciferol (VITAMIN D3) 2000 UNITS TABS Take 2,000 Int'l Units by mouth daily.      . metoprolol (LOPRESSOR) 50 MG tablet Take 0.5 tablets (25 mg total) by mouth 2 (two) times daily. 90 tablet 3  . rivaroxaban (XARELTO) 20 MG TABS tablet Take 1 tablet (20 mg total) by mouth daily with supper. 90 tablet 3  . simvastatin (ZOCOR) 40 MG tablet Take 1 tablet (40 mg total) by mouth at bedtime. 90 tablet 3   No current facility-administered medications on file prior to visit.    No Known Allergies  Past Medical History  Diagnosis Date  . Coronary artery disease     Nov 2009-MI-stenting of the mid LAD drug-eluding stent  . Hypertension   . LV dysfunction   . Hyperlipidemia   . Atrial fibrillation   . Prostate ca 05/05/2009   prostate cancer 11/09-tx with cryotherapy, Dr Gaynelle Arabian  . MI (myocardial infarction) 1998  . Osteopenia   . Hx of radiation therapy 05/15/14- 07/11/14    prostate 7800 cGy 40 sessions, seminal vesicles 5600 cGy 40 sessions    Past Surgical History  Procedure Laterality Date  . Cardiac catheterization      Nov 2009-drug eluding stent to mid LAD  . Hernia repair      inguinal  . Prostate cryoablation  05/05/2009    Dr Gaynelle Arabian  . Prostate biopsy  09/25/2008    gleason 4+3=7    History  Smoking status  . Never Smoker   Smokeless tobacco  . Not on file    History  Alcohol Use No    Family History  Problem Relation Age of Onset  . Heart disease Mother   . Heart failure Mother   . Heart disease Brother   . Hematuria Father   . Cancer Father     prostate  . Heart disease Sister     Review of Systems: As noted in history of present illness.  All other systems were reviewed and are negative.  Physical Exam: BP 123/85 mmHg  Pulse 74  Wt 179 lb 12.8 oz (81.557 kg)  PF 510 L/min He is a pleasant white male in no acute distress. HEENT is  unremarkable. Neck is supple without JVD, adenopathy, thyromegaly, or bruits. Lungs are clear. Cardiac exam reveals an irregular rate and rhythm without gallop, murmur, or click. Abdomen is soft and nontender. No masses or bruits. He has no significant edema. Pedal pulses are good. He is alert and oriented x3. Cranial nerves II through XII are intact.  LABORATORY DATA: Lab Results  Component Value Date   WBC 5.7 11/19/2014   HGB 14.0 11/19/2014   HCT 42.1 11/19/2014   PLT 152 11/19/2014   GLUCOSE 117* 11/19/2014   CHOL 161 11/19/2014   TRIG 76 11/19/2014   HDL 80 11/19/2014   LDLCALC 66 11/19/2014   ALT 11 11/19/2014   AST 22 11/19/2014   NA 139 11/19/2014   K 5.3 11/19/2014   CL 102 11/19/2014   CREATININE 1.01 11/19/2014   BUN 16 11/19/2014   CO2 27 11/19/2014   TSH 2.02 11/14/2013   INR 1.1 11/01/2008     Assessment /  Plan: 1. Coronary disease with remote anterior myocardial infarction treated with drug-eluting stent to the LAD. Asymptomatic. Myoview study 11/12 showed no ischemia. Will continue medical therapy with  metoprolol and statin therapy.   2. Hypertension, controlled.  3. Hypercholesterolemia, on Zocor. Controlled.  4. Left ventricular dysfunction. Ejection fraction 40-45%. Unchanged from 2009.   5. Atrial fibrillation. Rate is controlled on low-dose metoprolol. Patient is completely asymptomatic. [place] score is 3. Continue Xarelto 20 mg daily.   6. Rib fracture. I okayed him to take motrin 200 mg q 6 hrs for the next week due to his acute pain. Instructed to stop if he has any GI upset.

## 2015-05-02 NOTE — Patient Instructions (Signed)
You may take Motrin 200 mg every 6 hours as needed for the next week  Otherwise continue your current therapy  I will see you in 6 months

## 2015-06-23 ENCOUNTER — Other Ambulatory Visit: Payer: Self-pay

## 2015-12-10 ENCOUNTER — Ambulatory Visit (INDEPENDENT_AMBULATORY_CARE_PROVIDER_SITE_OTHER): Payer: Medicare Other | Admitting: Cardiology

## 2015-12-10 ENCOUNTER — Encounter: Payer: Self-pay | Admitting: Cardiology

## 2015-12-10 VITALS — BP 120/80 | HR 65 | Ht 71.0 in | Wt 178.0 lb

## 2015-12-10 DIAGNOSIS — I482 Chronic atrial fibrillation, unspecified: Secondary | ICD-10-CM

## 2015-12-10 DIAGNOSIS — I10 Essential (primary) hypertension: Secondary | ICD-10-CM

## 2015-12-10 DIAGNOSIS — I2510 Atherosclerotic heart disease of native coronary artery without angina pectoris: Secondary | ICD-10-CM

## 2015-12-10 MED ORDER — METOPROLOL TARTRATE 50 MG PO TABS: 25.0000 mg | ORAL_TABLET | Freq: Two times a day (BID) | ORAL | Status: DC

## 2015-12-10 MED ORDER — AMLODIPINE BESYLATE 10 MG PO TABS: 10.0000 mg | ORAL_TABLET | Freq: Every day | ORAL | Status: DC

## 2015-12-10 MED ORDER — SIMVASTATIN 40 MG PO TABS: 40.0000 mg | ORAL_TABLET | Freq: Every day | ORAL | Status: DC

## 2015-12-10 NOTE — Patient Instructions (Signed)
Continue your current therapy  I will see you in 6 months.   

## 2015-12-10 NOTE — Progress Notes (Signed)
[name] Date of Birth: April 22, 1933   History of Present Illness: [name] is seen today for followup AFib. He has a history of an anterior myocardial infarction in November 2009. This was treated with a drug-eluting stent to the mid LAD.  He had a stress Myoview study November 2012. He is able to walk for 8 minutes. He had no clinical symptoms. Images showed a fixed anterior septal and apical defect. There was no ischemia. Ejection fraction was 44%.  In November 2014 he was found to be in atrial fibrillation with a controlled response. He was started on Xarelto. Echo showed EF 40-45% with moderate biatrial enlargement. On follow up today he thinks he is doing OK. He has low back problems and some unsteadiness of gait. He completed RT for prostate CA and tolerated this fairly well. States he has a hard time walking fast now.  No chest pain or SOB. No palpitations or dizziness.  Current Outpatient Prescriptions on File Prior to Visit  Medication Sig Dispense Refill  . calcium carbonate (OS-CAL) 600 MG TABS Take 600 mg by mouth 2 (two) times daily with a meal.      . Cholecalciferol (VITAMIN D3) 2000 UNITS TABS Take 2,000 Int'l Units by mouth daily.      . rivaroxaban (XARELTO) 20 MG TABS tablet Take 1 tablet (20 mg total) by mouth daily with supper. 90 tablet 3   No current facility-administered medications on file prior to visit.    No Known Allergies  Past Medical History  Diagnosis Date  . Coronary artery disease     Nov 2009-MI-stenting of the mid LAD drug-eluding stent  . Hypertension   . LV dysfunction   . Hyperlipidemia   . Atrial fibrillation ([name])   . Prostate CA ([name]) 05/05/2009    prostate cancer 11/09-tx with cryotherapy, Dr [name]  . MI (myocardial infarction) ([name]) 1998  . Osteopenia   . Hx of radiation therapy 05/15/14- 07/11/14    prostate 7800 cGy 40 sessions, seminal vesicles 5600 cGy 40 sessions    Past Surgical History  Procedure Laterality Date  .  Cardiac catheterization      Nov 2009-drug eluding stent to mid LAD  . Hernia repair      inguinal  . Prostate cryoablation  05/05/2009    Dr [name]  . Prostate biopsy  09/25/2008    gleason 4+3=7    History  Smoking status  . Never Smoker   Smokeless tobacco  . Not on file    History  Alcohol Use No    Family History  Problem Relation Age of Onset  . Heart disease Mother   . Heart failure Mother   . Heart disease Brother   . Hematuria Father   . Cancer Father     prostate  . Heart disease Sister     Review of Systems: As noted in history of present illness.  All other systems were reviewed and are negative.  Physical Exam: BP 120/80 mmHg  Pulse 65  Ht 5\' 11"  (1.803 m)  Wt 80.74 kg (178 lb)  BMI 24.84 kg/m2 He is a pleasant white male in no acute distress. HEENT is unremarkable. Neck is supple without JVD, adenopathy, thyromegaly, or bruits. Lungs are clear. Cardiac exam reveals an irregular rate and rhythm without gallop, murmur, or click. Abdomen is soft and nontender. No masses or bruits. He has no significant edema. Pedal pulses are good. He is alert and oriented x3. Cranial nerves II through XII  are intact.  LABORATORY DATA: Lab Results  Component Value Date   WBC 5.7 11/19/2014   HGB 14.0 11/19/2014   HCT 42.1 11/19/2014   PLT 152 11/19/2014   GLUCOSE 117* 11/19/2014   CHOL 161 11/19/2014   TRIG 76 11/19/2014   HDL 80 11/19/2014   LDLCALC 66 11/19/2014   ALT 11 11/19/2014   AST 22 11/19/2014   NA 139 11/19/2014   K 5.3 11/19/2014   CL 102 11/19/2014   CREATININE 1.01 11/19/2014   BUN 16 11/19/2014   CO2 27 11/19/2014   TSH 2.02 11/14/2013   INR 1.1 11/01/2008   Ecg today shows atrial fibrillation with rate 65. Nonspecific TWA. I have personally reviewed and interpreted this study.   Assessment / Plan: 1. Coronary disease with remote anterior myocardial infarction treated with drug-eluting stent to the LAD. Asymptomatic. Myoview study 11/12  showed no ischemia. Will continue medical therapy with  metoprolol and statin therapy.   2. Hypertension, controlled.  3. Hypercholesterolemia, on Zocor. Controlled.  4. Left ventricular dysfunction. Ejection fraction 40-45%. Unchanged from 2009.   5. Atrial fibrillation. Rate is controlled on low-dose metoprolol. Patient is  asymptomatic. [place] score is 3. Continue Xarelto 20 mg daily.   I will follow up in 6 months.

## 2016-04-12 ENCOUNTER — Telehealth: Payer: Self-pay | Admitting: Cardiology

## 2016-04-12 NOTE — Telephone Encounter (Signed)
New message ° ° ° ° ° ° °Pt states he is returning a call to the nurse °

## 2016-04-13 ENCOUNTER — Other Ambulatory Visit: Payer: Self-pay | Admitting: *Deleted

## 2016-04-13 MED ORDER — RIVAROXABAN 20 MG PO TABS: 20.0000 mg | ORAL_TABLET | Freq: Every day | ORAL | Status: DC

## 2016-04-13 NOTE — Telephone Encounter (Signed)
Returned call to patient spoke to wife.She stated husband needed a refill for one of his medications.Stated refill has already been taken care of.

## 2016-06-21 ENCOUNTER — Encounter: Payer: Self-pay | Admitting: Cardiology

## 2016-06-21 ENCOUNTER — Ambulatory Visit (INDEPENDENT_AMBULATORY_CARE_PROVIDER_SITE_OTHER): Payer: Medicare Other | Admitting: Cardiology

## 2016-06-21 VITALS — BP 120/72 | HR 63 | Ht 70.0 in | Wt 176.2 lb

## 2016-06-21 DIAGNOSIS — I482 Chronic atrial fibrillation, unspecified: Secondary | ICD-10-CM

## 2016-06-21 DIAGNOSIS — I2510 Atherosclerotic heart disease of native coronary artery without angina pectoris: Secondary | ICD-10-CM | POA: Diagnosis not present

## 2016-06-21 DIAGNOSIS — E785 Hyperlipidemia, unspecified: Secondary | ICD-10-CM

## 2016-06-21 DIAGNOSIS — I10 Essential (primary) hypertension: Secondary | ICD-10-CM

## 2016-06-21 DIAGNOSIS — I519 Heart disease, unspecified: Secondary | ICD-10-CM

## 2016-06-21 LAB — CBC WITH DIFFERENTIAL/PLATELET
BASOS ABS: 0 {cells}/uL (ref 0–200)
Basophils Relative: 0 %
EOS PCT: 2 %
Eosinophils Absolute: 120 cells/uL (ref 15–500)
HCT: 45.2 % (ref 38.5–50.0)
HEMOGLOBIN: 14.8 g/dL (ref 13.2–17.1)
LYMPHS ABS: 1680 {cells}/uL (ref 850–3900)
Lymphocytes Relative: 28 %
MCH: 31.8 pg (ref 27.0–33.0)
MCHC: 32.7 g/dL (ref 32.0–36.0)
MCV: 97 fL (ref 80.0–100.0)
MONOS PCT: 9 %
MPV: 11.5 fL (ref 7.5–12.5)
Monocytes Absolute: 540 cells/uL (ref 200–950)
NEUTROS PCT: 61 %
Neutro Abs: 3660 cells/uL (ref 1500–7800)
Platelets: 160 10*3/uL (ref 140–400)
RBC: 4.66 MIL/uL (ref 4.20–5.80)
RDW: 14.4 % (ref 11.0–15.0)
WBC: 6 10*3/uL (ref 3.8–10.8)

## 2016-06-21 LAB — BASIC METABOLIC PANEL
BUN: 15 mg/dL (ref 7–25)
CALCIUM: 9.7 mg/dL (ref 8.6–10.3)
CO2: 29 mmol/L (ref 20–31)
Chloride: 105 mmol/L (ref 98–110)
Creat: 0.81 mg/dL (ref 0.70–1.11)
Glucose, Bld: 105 mg/dL — ABNORMAL HIGH (ref 65–99)
Potassium: 4.4 mmol/L (ref 3.5–5.3)
Sodium: 142 mmol/L (ref 135–146)

## 2016-06-21 LAB — HEPATIC FUNCTION PANEL
ALK PHOS: 71 U/L (ref 40–115)
ALT: 12 U/L (ref 9–46)
AST: 20 U/L (ref 10–35)
Albumin: 4.5 g/dL (ref 3.6–5.1)
BILIRUBIN INDIRECT: 0.8 mg/dL (ref 0.2–1.2)
Bilirubin, Direct: 0.3 mg/dL — ABNORMAL HIGH (ref <=0.2)
Total Bilirubin: 1.1 mg/dL (ref 0.2–1.2)
Total Protein: 7.4 g/dL (ref 6.1–8.1)

## 2016-06-21 LAB — LIPID PANEL
Cholesterol: 149 mg/dL (ref 125–200)
HDL: 84 mg/dL (ref >=40)
LDL Cholesterol: 53 mg/dL (ref <130)
TRIGLYCERIDES: 58 mg/dL (ref <150)
Total CHOL/HDL Ratio: 1.8 Ratio (ref <=5.0)
VLDL: 12 mg/dL (ref <30)

## 2016-06-21 NOTE — Patient Instructions (Signed)
We will check blood work today  Continue your current therapy  I will see you in 6 months 

## 2016-06-21 NOTE — Progress Notes (Signed)
[name] Date of Birth: [birth year]   History of Present Illness: [name] is seen today for followup AFib. He has a history of an anterior myocardial infarction in November 2009. This was treated with a drug-eluting stent to the mid LAD.  He had a stress Myoview study November 2012. He is able to walk for 8 minutes. He had no clinical symptoms. Images showed a fixed anterior septal and apical defect. There was no ischemia. Ejection fraction was 44%.  In November 2014 he was found to be in atrial fibrillation with a controlled response. He was started on Xarelto. Echo showed EF 40-45% with moderate biatrial enlargement. On follow up today he is doing much better. His back pain has improved and he is now exercising for 1.5 hours/day.  He thinks some of his prior issues were related to his prostate RT.  No chest pain or SOB. No palpitations or dizziness. Overall feels much stronger.    Current Outpatient Prescriptions on File Prior to Visit  Medication Sig Dispense Refill  . amLODipine (NORVASC) 10 MG tablet Take 1 tablet (10 mg total) by mouth daily. 90 tablet 3  . calcium carbonate (OS-CAL) 600 MG TABS Take 600 mg by mouth 2 (two) times daily with a meal.      . Cholecalciferol (VITAMIN D3) 2000 UNITS TABS Take 2,000 Int'l Units by mouth daily.      . metoprolol (LOPRESSOR) 50 MG tablet Take 0.5 tablets (25 mg total) by mouth 2 (two) times daily. 90 tablet 3  . rivaroxaban (XARELTO) 20 MG TABS tablet Take 1 tablet (20 mg total) by mouth daily with supper. 90 tablet 2  . simvastatin (ZOCOR) 40 MG tablet Take 1 tablet (40 mg total) by mouth at bedtime. 90 tablet 3   No current facility-administered medications on file prior to visit.    No Known Allergies  Past Medical History  Diagnosis Date  . Coronary artery disease     Nov 2009-MI-stenting of the mid LAD drug-eluding stent  . Hypertension   . LV dysfunction   . Hyperlipidemia   . Atrial fibrillation ([place])   . Prostate CA ([place])  05/05/2009    prostate cancer 11/09-tx with cryotherapy, Dr Gaynelle Arabian  . MI (myocardial infarction) ([place]) 1998  . Osteopenia   . Hx of radiation therapy 05/15/14- 07/11/14    prostate 7800 cGy 40 sessions, seminal vesicles 5600 cGy 40 sessions    Past Surgical History  Procedure Laterality Date  . Cardiac catheterization      Nov 2009-drug eluding stent to mid LAD  . Hernia repair      inguinal  . Prostate cryoablation  05/05/2009    Dr Gaynelle Arabian  . Prostate biopsy  09/25/2008    gleason 4+3=7    History  Smoking status  . Never Smoker   Smokeless tobacco  . Not on file    History  Alcohol Use No    Family History  Problem Relation Age of Onset  . Heart disease Mother   . Heart failure Mother   . Heart disease Brother   . Hematuria Father   . Cancer Father     prostate  . Heart disease Sister     Review of Systems: As noted in history of present illness.  All other systems were reviewed and are negative.  Physical Exam: BP 120/72 mmHg  Pulse 63  Ht 5\' 10"  (1.778 m)  Wt 176 lb 3.2 oz (79.924 kg)  BMI 25.28 kg/m2 He is  a pleasant white male in no acute distress. HEENT is unremarkable. Neck is supple without JVD, adenopathy, thyromegaly, or bruits. Lungs are clear. Cardiac exam reveals an irregular rate and rhythm without gallop, murmur, or click. Abdomen is soft and nontender. No masses or bruits. He has no significant edema. Pedal pulses are good. He is alert and oriented x3. Cranial nerves II through XII are intact.  LABORATORY DATA: Lab Results  Component Value Date   WBC 5.7 11/19/2014   HGB 14.0 11/19/2014   HCT 42.1 11/19/2014   PLT 152 11/19/2014   GLUCOSE 117* 11/19/2014   CHOL 161 11/19/2014   TRIG 76 11/19/2014   HDL 80 11/19/2014   LDLCALC 66 11/19/2014   ALT 11 11/19/2014   AST 22 11/19/2014   NA 139 11/19/2014   K 5.3 11/19/2014   CL 102 11/19/2014   CREATININE 1.01 11/19/2014   BUN 16 11/19/2014   CO2 27 11/19/2014   TSH 2.02  11/14/2013   INR 1.1 11/01/2008     Assessment / Plan: 1. Coronary disease with remote anterior myocardial infarction treated with drug-eluting stent to the LAD. Asymptomatic. Myoview study 11/12 showed no ischemia. Will continue medical therapy with  metoprolol and statin therapy.   2. Hypertension, controlled.  3. Hypercholesterolemia, on Zocor. Follow up fasting lab work today.  4. Left ventricular dysfunction. Ejection fraction 40-45%. Unchanged from 2009.   5. Atrial fibrillation. Rate is controlled on low-dose metoprolol. Patient is  asymptomatic. [place] score is 3. Continue Xarelto 20 mg daily. No current bleeding issues.  I will follow up in 6 months.

## 2016-12-12 NOTE — Progress Notes (Signed)
[name] Date of Birth: 1933/06/27   History of Present Illness: [name] is seen today for followup AFib. He has a history of an anterior myocardial infarction in November 2009. This was treated with a drug-eluting stent to the mid LAD.  He had a stress Myoview study November 2012. He is able to walk for 8 minutes. He had no clinical symptoms. Images showed a fixed anterior septal and apical defect. There was no ischemia. Ejection fraction was 44%.  In November 2014 he was found to be in atrial fibrillation with a controlled response. He was started on Xarelto. Echo showed EF 40-45% with moderate biatrial enlargement. On follow up today he is doing very well. He is now exercising for 1.5 hours/day.  He feels his stamina has improved.   No chest pain or SOB. No palpitations or dizziness. Edema is better. No concerns today.    Current Outpatient Prescriptions on File Prior to Visit  Medication Sig Dispense Refill  . amLODipine (NORVASC) 10 MG tablet Take 1 tablet (10 mg total) by mouth daily. 90 tablet 3  . calcium carbonate (OS-CAL) 600 MG TABS Take 600 mg by mouth 2 (two) times daily with a meal.      . Cholecalciferol (VITAMIN D3) 2000 UNITS TABS Take 2,000 Int'l Units by mouth daily.      . metoprolol (LOPRESSOR) 50 MG tablet Take 0.5 tablets (25 mg total) by mouth 2 (two) times daily. 90 tablet 3  . rivaroxaban (XARELTO) 20 MG TABS tablet Take 1 tablet (20 mg total) by mouth daily with supper. 90 tablet 2  . simvastatin (ZOCOR) 40 MG tablet Take 1 tablet (40 mg total) by mouth at bedtime. 90 tablet 3   No current facility-administered medications on file prior to visit.     No Known Allergies  Past Medical History:  Diagnosis Date  . Atrial fibrillation ([place])   . Coronary artery disease    Nov 2009-MI-stenting of the mid LAD drug-eluding stent  . Hx of radiation therapy 05/15/14- 07/11/14   prostate 7800 cGy 40 sessions, seminal vesicles 5600 cGy 40 sessions  . Hyperlipidemia    . Hypertension   . LV dysfunction   . MI (myocardial infarction) 1998  . Osteopenia   . Prostate CA ([place]) 05/05/2009   prostate cancer 11/09-tx with cryotherapy, Dr Gaynelle Arabian    Past Surgical History:  Procedure Laterality Date  . CARDIAC CATHETERIZATION     Nov 2009-drug eluding stent to mid LAD  . HERNIA REPAIR     inguinal  . PROSTATE BIOPSY  09/25/2008   gleason 4+3=7  . PROSTATE CRYOABLATION  05/05/2009   Dr Gaynelle Arabian    History  Smoking Status  . Never Smoker  Smokeless Tobacco  . Not on file    History  Alcohol Use No    Family History  Problem Relation Age of Onset  . Heart disease Mother   . Heart failure Mother   . Heart disease Brother   . Hematuria Father   . Cancer Father     prostate  . Heart disease Sister     Review of Systems: As noted in history of present illness.  All other systems were reviewed and are negative.  Physical Exam: BP 110/86   Pulse 67   Ht 5\' 10"  (1.778 m)   Wt 176 lb 1.6 oz (79.9 kg)   BMI 25.27 kg/m  He is a pleasant white male in no acute distress. HEENT is unremarkable. Neck is supple  without JVD, adenopathy, thyromegaly, or bruits. Lungs are clear. Cardiac exam reveals an irregular rate and rhythm without gallop, murmur, or click. Abdomen is soft and nontender. No masses or bruits. He has no significant edema. Pedal pulses are good. He is alert and oriented x3. Cranial nerves II through XII are intact.  LABORATORY DATA: Lab Results  Component Value Date   WBC 6.0 06/21/2016   HGB 14.8 06/21/2016   HCT 45.2 06/21/2016   PLT 160 06/21/2016   GLUCOSE 105 (H) 06/21/2016   CHOL 149 06/21/2016   TRIG 58 06/21/2016   HDL 84 06/21/2016   LDLCALC 53 06/21/2016   ALT 12 06/21/2016   AST 20 06/21/2016   NA 142 06/21/2016   K 4.4 06/21/2016   CL 105 06/21/2016   CREATININE 0.81 06/21/2016   BUN 15 06/21/2016   CO2 29 06/21/2016   TSH 2.02 11/14/2013   INR 1.1 11/01/2008   Ecg today shows Afib with rate 67.  Nonspecific ST-T changes. LAD. I have personally reviewed and interpreted this study.  Assessment / Plan: 1. Coronary disease with remote anterior myocardial infarction treated with drug-eluting stent to the LAD. Asymptomatic. Myoview study 11/12 showed no ischemia. Will continue medical therapy with  metoprolol and statin therapy.   2. Hypertension, controlled.  3. Hypercholesterolemia, on Zocor. Excellent control.  4. Left ventricular dysfunction. Ejection fraction 40-45%. Unchanged from 2009.   5. Atrial fibrillation. Rate is controlled on low-dose metoprolol. Patient is  asymptomatic. [place] score is 3. Continue Xarelto 20 mg daily.   I will follow up in 6 months with lab work.

## 2016-12-14 ENCOUNTER — Encounter: Payer: Self-pay | Admitting: Cardiology

## 2016-12-14 ENCOUNTER — Ambulatory Visit (INDEPENDENT_AMBULATORY_CARE_PROVIDER_SITE_OTHER): Payer: Medicare Other | Admitting: Cardiology

## 2016-12-14 VITALS — BP 110/86 | HR 67 | Ht 70.0 in | Wt 176.1 lb

## 2016-12-14 DIAGNOSIS — I2510 Atherosclerotic heart disease of native coronary artery without angina pectoris: Secondary | ICD-10-CM | POA: Diagnosis not present

## 2016-12-14 DIAGNOSIS — E7800 Pure hypercholesterolemia, unspecified: Secondary | ICD-10-CM

## 2016-12-14 DIAGNOSIS — I482 Chronic atrial fibrillation, unspecified: Secondary | ICD-10-CM

## 2016-12-14 DIAGNOSIS — I10 Essential (primary) hypertension: Secondary | ICD-10-CM | POA: Diagnosis not present

## 2016-12-14 MED ORDER — METOPROLOL TARTRATE 50 MG PO TABS: 25.0000 mg | ORAL_TABLET | Freq: Two times a day (BID) | ORAL | 3 refills | Status: DC

## 2016-12-14 MED ORDER — SIMVASTATIN 40 MG PO TABS: 40.0000 mg | ORAL_TABLET | Freq: Every day | ORAL | 3 refills | Status: DC

## 2016-12-14 MED ORDER — AMLODIPINE BESYLATE 10 MG PO TABS: 10.0000 mg | ORAL_TABLET | Freq: Every day | ORAL | 3 refills | Status: DC

## 2016-12-14 NOTE — Patient Instructions (Signed)
Continue your current therapy  I will see you in 6 months with lab work   

## 2017-03-16 ENCOUNTER — Telehealth: Payer: Self-pay | Admitting: Cardiology

## 2017-03-16 MED ORDER — RIVAROXABAN 20 MG PO TABS: 20.0000 mg | ORAL_TABLET | Freq: Every day | ORAL | 3 refills | Status: DC

## 2017-03-16 NOTE — Telephone Encounter (Signed)
New message    Pt is calling requesting a call from [place], he states he has a question about his medication.

## 2017-03-16 NOTE — Telephone Encounter (Signed)
Returned call to patient Xarelto 20 mg samples left at front desk of [facility] office. 

## 2017-06-07 LAB — CBC WITH DIFFERENTIAL/PLATELET
Basophils Absolute: 0 cells/uL (ref 0–200)
Basophils Relative: 0 %
Eosinophils Absolute: 118 cells/uL (ref 15–500)
Eosinophils Relative: 2 %
HEMATOCRIT: 42.7 % (ref 38.5–50.0)
HEMOGLOBIN: 13.4 g/dL (ref 13.2–17.1)
LYMPHS ABS: 1298 {cells}/uL (ref 850–3900)
Lymphocytes Relative: 22 %
MCH: 31.2 pg (ref 27.0–33.0)
MCHC: 31.4 g/dL — ABNORMAL LOW (ref 32.0–36.0)
MCV: 99.5 fL (ref 80.0–100.0)
MONO ABS: 649 {cells}/uL (ref 200–950)
MPV: 10.6 fL (ref 7.5–12.5)
Monocytes Relative: 11 %
Neutro Abs: 3835 cells/uL (ref 1500–7800)
Neutrophils Relative %: 65 %
Platelets: 150 10*3/uL (ref 140–400)
RBC: 4.29 MIL/uL (ref 4.20–5.80)
RDW: 14.2 % (ref 11.0–15.0)
WBC: 5.9 10*3/uL (ref 3.8–10.8)

## 2017-06-07 LAB — BASIC METABOLIC PANEL
BUN: 12 mg/dL (ref 7–25)
CHLORIDE: 104 mmol/L (ref 98–110)
CO2: 29 mmol/L (ref 20–31)
Calcium: 9.3 mg/dL (ref 8.6–10.3)
Creat: 0.88 mg/dL (ref 0.70–1.11)
GLUCOSE: 94 mg/dL (ref 65–99)
POTASSIUM: 4.6 mmol/L (ref 3.5–5.3)
Sodium: 140 mmol/L (ref 135–146)

## 2017-06-07 LAB — HEPATIC FUNCTION PANEL
ALBUMIN: 4.1 g/dL (ref 3.6–5.1)
ALT: 19 U/L (ref 9–46)
AST: 24 U/L (ref 10–35)
Alkaline Phosphatase: 82 U/L (ref 40–115)
BILIRUBIN INDIRECT: 1 mg/dL (ref 0.2–1.2)
Bilirubin, Direct: 0.3 mg/dL — ABNORMAL HIGH (ref <=0.2)
TOTAL PROTEIN: 6.9 g/dL (ref 6.1–8.1)
Total Bilirubin: 1.3 mg/dL — ABNORMAL HIGH (ref 0.2–1.2)

## 2017-06-07 LAB — LIPID PANEL
CHOL/HDL RATIO: 1.9 ratio (ref <5.0)
Cholesterol: 140 mg/dL (ref <200)
HDL: 72 mg/dL (ref >40)
LDL Cholesterol: 58 mg/dL (ref <100)
TRIGLYCERIDES: 49 mg/dL (ref <150)
VLDL: 10 mg/dL (ref <30)

## 2017-06-10 ENCOUNTER — Telehealth: Payer: Self-pay | Admitting: Cardiology

## 2017-06-10 NOTE — Telephone Encounter (Signed)
Called back, results discussed. Result notes updated.

## 2017-06-10 NOTE — Telephone Encounter (Signed)
New message ° ° ° °Pt is returning call about results. °

## 2017-06-12 NOTE — Progress Notes (Signed)
[name] Date of Birth: 11-08-1933   History of Present Illness: [name] is seen today for followup AFib. He has a history of an anterior myocardial infarction in November 2009. This was treated with a drug-eluting stent to the mid LAD.  He had a stress Myoview study November 2012. He is able to walk for 8 minutes. He had no clinical symptoms. Images showed a fixed anterior septal and apical defect. There was no ischemia. Ejection fraction was 44%.  In November 2014 he was found to be in atrial fibrillation with a controlled response. He was started on Xarelto. Echo showed EF 40-45% with moderate biatrial enlargement.  On follow up today he is doing very well. He is active and enjoys travelling. He walks at least 1.5 miles/daily.  No chest pain or SOB. No palpitations or dizziness. He only gets ankle edema when travelling a lot.   Current Outpatient Prescriptions on File Prior to Visit  Medication Sig Dispense Refill  . amLODipine (NORVASC) 10 MG tablet Take 1 tablet (10 mg total) by mouth daily. 90 tablet 3  . calcium carbonate (OS-CAL) 600 MG TABS Take 600 mg by mouth 2 (two) times daily with a meal.      . Cholecalciferol (VITAMIN D3) 2000 UNITS TABS Take 2,000 Int'l Units by mouth daily.      . metoprolol (LOPRESSOR) 50 MG tablet Take 0.5 tablets (25 mg total) by mouth 2 (two) times daily. 90 tablet 3  . rivaroxaban (XARELTO) 20 MG TABS tablet Take 1 tablet (20 mg total) by mouth daily with supper. 90 tablet 3  . simvastatin (ZOCOR) 40 MG tablet Take 1 tablet (40 mg total) by mouth at bedtime. 90 tablet 3   No current facility-administered medications on file prior to visit.     No Known Allergies  Past Medical History:  Diagnosis Date  . Atrial fibrillation ([place])   . Coronary artery disease    Nov 2009-MI-stenting of the mid LAD drug-eluding stent  . Hx of radiation therapy 05/15/14- 07/11/14   prostate 7800 cGy 40 sessions, seminal vesicles 5600 cGy 40 sessions  .  Hyperlipidemia   . Hypertension   . LV dysfunction   . MI (myocardial infarction) ([place]) 1998  . Osteopenia   . Prostate CA ([place]) 05/05/2009   prostate cancer 11/09-tx with cryotherapy, Dr Gaynelle Arabian    Past Surgical History:  Procedure Laterality Date  . CARDIAC CATHETERIZATION     Nov 2009-drug eluding stent to mid LAD  . HERNIA REPAIR     inguinal  . PROSTATE BIOPSY  09/25/2008   gleason 4+3=7  . PROSTATE CRYOABLATION  05/05/2009   Dr Gaynelle Arabian    History  Smoking Status  . Never Smoker  Smokeless Tobacco  . Never Used    History  Alcohol Use No    Family History  Problem Relation Age of Onset  . Heart disease Mother   . Heart failure Mother   . Heart disease Brother   . Hematuria Father   . Cancer Father        prostate  . Heart disease Sister     Review of Systems: As noted in history of present illness.  All other systems were reviewed and are negative.  Physical Exam: BP 122/72   Pulse 60   Ht 5\' 10"  (1.778 m)   Wt 175 lb (79.4 kg)   BMI 25.11 kg/m  He is a pleasant white male in no acute distress. HEENT is unremarkable. Neck is  supple without JVD, adenopathy, thyromegaly, or bruits. Lungs are clear. Cardiac exam reveals an irregular rate and rhythm without gallop, murmur, or click. Abdomen is soft and nontender. No masses or bruits. He has no significant edema. Pedal pulses are good. He is alert and oriented x3. Cranial nerves II through XII are intact.  LABORATORY DATA: Lab Results  Component Value Date   WBC 5.9 06/07/2017   HGB 13.4 06/07/2017   HCT 42.7 06/07/2017   PLT 150 06/07/2017   GLUCOSE 94 06/07/2017   CHOL 140 06/07/2017   TRIG 49 06/07/2017   HDL 72 06/07/2017   LDLCALC 58 06/07/2017   ALT 19 06/07/2017   AST 24 06/07/2017   NA 140 06/07/2017   K 4.6 06/07/2017   CL 104 06/07/2017   CREATININE 0.88 06/07/2017   BUN 12 06/07/2017   CO2 29 06/07/2017   TSH 2.02 11/14/2013   INR 1.1 11/01/2008     Assessment / Plan: 1.  Coronary disease with remote anterior myocardial infarction treated with drug-eluting stent to the LAD. Asymptomatic. Myoview study 11/12 showed no ischemia. Will continue medical therapy with  metoprolol and statin therapy.   2. Hypertension, controlled.  3. Hypercholesterolemia, on Zocor. Excellent control.  4. Left ventricular dysfunction. Ejection fraction 40-45% by Echo 2014. Unchanged from 2009.   5. Atrial fibrillation. Rate is controlled on low-dose metoprolol. Patient is  asymptomatic. [place] score is 3. Continue Xarelto 20 mg daily.   I will follow up in 6 months

## 2017-06-15 ENCOUNTER — Ambulatory Visit (INDEPENDENT_AMBULATORY_CARE_PROVIDER_SITE_OTHER): Payer: Medicare Other | Admitting: Cardiology

## 2017-06-15 ENCOUNTER — Encounter: Payer: Self-pay | Admitting: Cardiology

## 2017-06-15 VITALS — BP 122/72 | HR 60 | Ht 70.0 in | Wt 175.0 lb

## 2017-06-15 DIAGNOSIS — E7800 Pure hypercholesterolemia, unspecified: Secondary | ICD-10-CM | POA: Diagnosis not present

## 2017-06-15 DIAGNOSIS — I482 Chronic atrial fibrillation, unspecified: Secondary | ICD-10-CM

## 2017-06-15 DIAGNOSIS — I10 Essential (primary) hypertension: Secondary | ICD-10-CM | POA: Diagnosis not present

## 2017-06-15 DIAGNOSIS — I2510 Atherosclerotic heart disease of native coronary artery without angina pectoris: Secondary | ICD-10-CM | POA: Diagnosis not present

## 2017-06-15 NOTE — Patient Instructions (Signed)
Continue your current therapy  I will see you in 6 months.   

## 2017-08-09 ENCOUNTER — Telehealth: Payer: Self-pay | Admitting: Cardiology

## 2017-08-09 NOTE — Telephone Encounter (Signed)
Returned call to patient Xarelto 20 mg samples left at [facility] front desk.

## 2017-08-09 NOTE — Telephone Encounter (Signed)
Patient calling the office for samples of medication: ° ° °1.  What medication and dosage are you requesting samples for?Xarelto °2.  Are you currently out of this medication?  ° ° ° °

## 2017-12-07 ENCOUNTER — Telehealth: Payer: Self-pay | Admitting: Cardiology

## 2017-12-07 ENCOUNTER — Ambulatory Visit: Payer: Medicare Other | Admitting: Cardiology

## 2017-12-07 MED ORDER — METOPROLOL TARTRATE 50 MG PO TABS: 25.0000 mg | ORAL_TABLET | Freq: Two times a day (BID) | ORAL | 3 refills | Status: DC

## 2017-12-07 MED ORDER — SIMVASTATIN 40 MG PO TABS: 40.0000 mg | ORAL_TABLET | Freq: Every day | ORAL | 3 refills | Status: DC

## 2017-12-07 MED ORDER — AMLODIPINE BESYLATE 10 MG PO TABS: 10.0000 mg | ORAL_TABLET | Freq: Every day | ORAL | 3 refills | Status: DC

## 2017-12-07 NOTE — Telephone Encounter (Signed)
New message     *STAT* If patient is at the pharmacy, call can be transferred to refill team.   1. Which medications need to be refilled? (please list name of each medication and dose if known) metoprolol (LOPRESSOR) 50 MG tablet, simvastatin (ZOCOR) 40 MG tablet and amLODipine (NORVASC) 10 MG tablet  2. Which pharmacy/location (including street and city if local pharmacy) is medication to be sent to? [facility]  Mail order  3. Do they need a 30 day or 90 day supply? [place]

## 2018-01-31 NOTE — Progress Notes (Deleted)
[name] Date of Birth: [birth year]   History of Present Illness: [name] is seen today for followup AFib. He has a history of an anterior myocardial infarction in November 2009. This was treated with a drug-eluting stent to the mid LAD.  He had a stress Myoview study November 2012. He is able to walk for 8 minutes. He had no clinical symptoms. Images showed a fixed anterior septal and apical defect. There was no ischemia. Ejection fraction was 44%.  In November 2014 he was found to be in atrial fibrillation with a controlled response. He was started on Xarelto. Echo showed EF 40-45% with moderate biatrial enlargement.  On follow up today he is doing very well. He is active and enjoys travelling. He walks at least 1.5 miles/daily.  No chest pain or SOB. No palpitations or dizziness. He only gets ankle edema when travelling a lot.   Current Outpatient Medications on File Prior to Visit  Medication Sig Dispense Refill  . amLODipine (NORVASC) 10 MG tablet Take 1 tablet (10 mg total) by mouth daily. 90 tablet 3  . calcium carbonate (OS-CAL) 600 MG TABS Take 600 mg by mouth 2 (two) times daily with a meal.      . Cholecalciferol (VITAMIN D3) 2000 UNITS TABS Take 2,000 Int'l Units by mouth daily.      . metoprolol tartrate (LOPRESSOR) 50 MG tablet Take 0.5 tablets (25 mg total) by mouth 2 (two) times daily. 90 tablet 3  . rivaroxaban (XARELTO) 20 MG TABS tablet Take 1 tablet (20 mg total) by mouth daily with supper. 90 tablet 3  . simvastatin (ZOCOR) 40 MG tablet Take 1 tablet (40 mg total) by mouth at bedtime. 90 tablet 3   No current facility-administered medications on file prior to visit.     No Known Allergies  Past Medical History:  Diagnosis Date  . Atrial fibrillation ([place])   . Coronary artery disease    Nov 2009-MI-stenting of the mid LAD drug-eluding stent  . Hx of radiation therapy 05/15/14- 07/11/14   prostate 7800 cGy 40 sessions, seminal vesicles 5600 cGy 40 sessions  .  Hyperlipidemia   . Hypertension   . LV dysfunction   . MI (myocardial infarction) ([place]) 1998  . Osteopenia   . Prostate CA ([place]) 05/05/2009   prostate cancer 11/09-tx with cryotherapy, Dr Gaynelle Arabian    Past Surgical History:  Procedure Laterality Date  . CARDIAC CATHETERIZATION     Nov 2009-drug eluding stent to mid LAD  . HERNIA REPAIR     inguinal  . PROSTATE BIOPSY  09/25/2008   gleason 4+3=7  . PROSTATE CRYOABLATION  05/05/2009   Dr Gaynelle Arabian    Social History   Tobacco Use  Smoking Status Never Smoker  Smokeless Tobacco Never Used    Social History   Substance and Sexual Activity  Alcohol Use No    Family History  Problem Relation Age of Onset  . Heart disease Mother   . Heart failure Mother   . Heart disease Brother   . Hematuria Father   . Cancer Father        prostate  . Heart disease Sister     Review of Systems: As noted in history of present illness.  All other systems were reviewed and are negative.  Physical Exam: There were no vitals taken for this visit. He is a pleasant white male in no acute distress. HEENT is unremarkable. Neck is supple without JVD, adenopathy, thyromegaly, or bruits. Lungs are  clear. Cardiac exam reveals an irregular rate and rhythm without gallop, murmur, or click. Abdomen is soft and nontender. No masses or bruits. He has no significant edema. Pedal pulses are good. He is alert and oriented x3. Cranial nerves II through XII are intact.  LABORATORY DATA: Lab Results  Component Value Date   WBC 5.9 06/07/2017   HGB 13.4 06/07/2017   HCT 42.7 06/07/2017   PLT 150 06/07/2017   GLUCOSE 94 06/07/2017   CHOL 140 06/07/2017   TRIG 49 06/07/2017   HDL 72 06/07/2017   LDLCALC 58 06/07/2017   ALT 19 06/07/2017   AST 24 06/07/2017   NA 140 06/07/2017   K 4.6 06/07/2017   CL 104 06/07/2017   CREATININE 0.88 06/07/2017   BUN 12 06/07/2017   CO2 29 06/07/2017   TSH 2.02 11/14/2013   INR 1.1 11/01/2008     Assessment /  Plan: 1. Coronary disease with remote anterior myocardial infarction treated with drug-eluting stent to the LAD. Asymptomatic. Myoview study 11/12 showed no ischemia. Will continue medical therapy with  metoprolol and statin therapy.   2. Hypertension, controlled.  3. Hypercholesterolemia, on Zocor. Excellent control.  4. Left ventricular dysfunction. Ejection fraction 40-45% by Echo 2014. Unchanged from 2009.   5. Atrial fibrillation. Rate is controlled on low-dose metoprolol. Patient is  asymptomatic. [place] score is 3. Continue Xarelto 20 mg daily.   I will follow up in 6 months

## 2018-02-02 ENCOUNTER — Ambulatory Visit: Payer: Medicare Other | Admitting: Cardiology

## 2018-02-18 NOTE — Progress Notes (Signed)
[name] Date of Birth: 12/16/1933   History of Present Illness: [name] is seen today for followup AFib. He has a history of an anterior myocardial infarction in November 2009. This was treated with a drug-eluting stent to the mid LAD.  He had a stress Myoview study November 2012. He is able to walk for 8 minutes. He had no clinical symptoms. Images showed a fixed anterior septal and apical defect. There was no ischemia. Ejection fraction was 44%.  In November 2014 he was found to be in atrial fibrillation with a controlled response. He was started on Xarelto. Echo showed EF 40-45% with moderate biatrial enlargement.  On follow up today he is doing very well. He continues to enjoy travelling and is considering a [place]. He walks 4 miles/day. Denies any chest pain, SOB, palpitations. Notes minimal ankle swelling.    Current Outpatient Medications on File Prior to Visit  Medication Sig Dispense Refill  . amLODipine (NORVASC) 10 MG tablet Take 1 tablet (10 mg total) by mouth daily. 90 tablet 3  . calcium carbonate (OS-CAL) 600 MG TABS Take 600 mg by mouth 2 (two) times daily with a meal.      . Cholecalciferol (VITAMIN D3) 2000 UNITS TABS Take 2,000 Int'l Units by mouth daily.      . metoprolol tartrate (LOPRESSOR) 50 MG tablet Take 0.5 tablets (25 mg total) by mouth 2 (two) times daily. 90 tablet 3  . rivaroxaban (XARELTO) 20 MG TABS tablet Take 1 tablet (20 mg total) by mouth daily with supper. 90 tablet 3  . simvastatin (ZOCOR) 40 MG tablet Take 1 tablet (40 mg total) by mouth at bedtime. 90 tablet 3   No current facility-administered medications on file prior to visit.     No Known Allergies  Past Medical History:  Diagnosis Date  . Atrial fibrillation ([place])   . Coronary artery disease    Nov 2009-MI-stenting of the mid LAD drug-eluding stent  . Hx of radiation therapy 05/15/14- 07/11/14   prostate 7800 cGy 40 sessions, seminal vesicles 5600 cGy 40 sessions   . Hyperlipidemia   . Hypertension   . LV dysfunction   . MI (myocardial infarction) ([place]) 1998  . Osteopenia   . Prostate CA ([place]) 05/05/2009   prostate cancer 11/09-tx with cryotherapy, Dr Gaynelle Arabian    Past Surgical History:  Procedure Laterality Date  . CARDIAC CATHETERIZATION     Nov 2009-drug eluding stent to mid LAD  . HERNIA REPAIR     inguinal  . PROSTATE BIOPSY  09/25/2008   gleason 4+3=7  . PROSTATE CRYOABLATION  05/05/2009   Dr Gaynelle Arabian    Social History   Tobacco Use  Smoking Status Never Smoker  Smokeless Tobacco Never Used    Social History   Substance and Sexual Activity  Alcohol Use No    Family History  Problem Relation Age of Onset  . Heart disease Mother   . Heart failure Mother   . Heart disease Brother   . Hematuria Father   . Cancer Father        prostate  . Heart disease Sister     Review of Systems: As noted in history of present illness.  All other systems were reviewed and are negative.  Physical Exam: BP 114/82 (BP Location: Right Arm, Patient Position: Sitting, Cuff Size: Normal)   Pulse 67   Ht 5\' 10"  (1.778 m)   Wt 175 lb 3.2 oz (79.5 kg)  BMI 25.14 kg/m  GENERAL:  Well appearing, elderly WM in NAD HEENT:  PERRL, EOMI, sclera are clear. Oropharynx is clear. NECK:  No jugular venous distention, carotid upstroke brisk and symmetric, no bruits, no thyromegaly or adenopathy LUNGS:  Clear to auscultation bilaterally CHEST:  Unremarkable HEART:  IRRR,  PMI not displaced or sustained,S1 and S2 within normal limits, no S3, no S4: no clicks, no rubs, no murmurs ABD:  Soft, nontender. BS +, no masses or bruits. No hepatomegaly, no splenomegaly EXT:  2 + pulses throughout, no edema, no cyanosis no clubbing SKIN:  Warm and dry.  No rashes NEURO:  Alert and oriented x 3. Cranial nerves II through XII intact. PSYCH:  Cognitively intact    LABORATORY DATA: Lab Results  Component Value Date   WBC 5.9 06/07/2017   HGB 13.4  06/07/2017   HCT 42.7 06/07/2017   PLT 150 06/07/2017   GLUCOSE 94 06/07/2017   CHOL 140 06/07/2017   TRIG 49 06/07/2017   HDL 72 06/07/2017   LDLCALC 58 06/07/2017   ALT 19 06/07/2017   AST 24 06/07/2017   NA 140 06/07/2017   K 4.6 06/07/2017   CL 104 06/07/2017   CREATININE 0.88 06/07/2017   BUN 12 06/07/2017   CO2 29 06/07/2017   TSH 2.02 11/14/2013   INR 1.1 11/01/2008   Ecg today shows Afib with rate 67. LAD. I have personally reviewed and interpreted this study.   Assessment / Plan: 1. Coronary disease with remote anterior myocardial infarction 2009 treated with drug-eluting stent to the LAD.  Myoview study 11/12 showed no ischemia. He remains asymptomatic with good exercise tolerance. Will continue medical therapy with  metoprolol and statin therapy.   2. Hypertension, controlled. Not a candidate for ACEi/ARB due to elevated potassium.  3. Hypercholesterolemia, on Zocor. Excellent control.  4. Left ventricular dysfunction. Ejection fraction 40-45% by Echo 2014. Unchanged from 2009.   5. Atrial fibrillation. Rate is controlled on low-dose metoprolol. Patient is  asymptomatic. [place] score is 3. Continue Xarelto 20 mg daily.   I will follow up in 6 months with lab work

## 2018-02-23 ENCOUNTER — Encounter: Payer: Self-pay | Admitting: Cardiology

## 2018-02-23 ENCOUNTER — Ambulatory Visit (INDEPENDENT_AMBULATORY_CARE_PROVIDER_SITE_OTHER): Payer: Medicare Other | Admitting: Cardiology

## 2018-02-23 VITALS — BP 114/82 | HR 67 | Ht 70.0 in | Wt 175.2 lb

## 2018-02-23 DIAGNOSIS — I482 Chronic atrial fibrillation, unspecified: Secondary | ICD-10-CM

## 2018-02-23 DIAGNOSIS — I2510 Atherosclerotic heart disease of native coronary artery without angina pectoris: Secondary | ICD-10-CM | POA: Diagnosis not present

## 2018-02-23 DIAGNOSIS — E7800 Pure hypercholesterolemia, unspecified: Secondary | ICD-10-CM

## 2018-02-23 DIAGNOSIS — I10 Essential (primary) hypertension: Secondary | ICD-10-CM | POA: Diagnosis not present

## 2018-02-23 NOTE — Patient Instructions (Addendum)
Continue your current therapy  I will see you in 6 months.   

## 2018-04-28 ENCOUNTER — Telehealth: Payer: Self-pay | Admitting: Cardiology

## 2018-04-28 NOTE — Telephone Encounter (Signed)
New Message:      Pt is wanting some more Xarelto samples

## 2018-04-28 NOTE — Telephone Encounter (Signed)
Medication samples have been provided to the patient.  Drug name: Xarelto 20 mg   Qty: 21    LOT: [id number]  Exp.Date: 04/21  Samples left at front desk for patient pick-up. Patient notified.  

## 2018-08-29 ENCOUNTER — Other Ambulatory Visit: Payer: Medicare Other

## 2018-08-29 LAB — HEPATIC FUNCTION PANEL
ALT: 13 IU/L (ref 0–44)
AST: 24 IU/L (ref 0–40)
Albumin: 4.6 g/dL (ref 3.5–4.7)
Alkaline Phosphatase: 89 IU/L (ref 39–117)
Bilirubin Total: 1 mg/dL (ref 0.0–1.2)
Bilirubin, Direct: 0.35 mg/dL (ref 0.00–0.40)
TOTAL PROTEIN: 7.1 g/dL (ref 6.0–8.5)

## 2018-08-29 LAB — CBC WITH DIFFERENTIAL/PLATELET
BASOS ABS: 0 10*3/uL (ref 0.0–0.2)
Basos: 1 %
EOS (ABSOLUTE): 0.1 10*3/uL (ref 0.0–0.4)
EOS: 2 %
HEMOGLOBIN: 14.2 g/dL (ref 13.0–17.7)
Hematocrit: 42.9 % (ref 37.5–51.0)
IMMATURE GRANS (ABS): 0 10*3/uL (ref 0.0–0.1)
Immature Granulocytes: 0 %
LYMPHS: 30 %
Lymphocytes Absolute: 1.6 10*3/uL (ref 0.7–3.1)
MCH: 32.7 pg (ref 26.6–33.0)
MCHC: 33.1 g/dL (ref 31.5–35.7)
MCV: 99 fL — ABNORMAL HIGH (ref 79–97)
MONOCYTES: 8 %
Monocytes Absolute: 0.5 10*3/uL (ref 0.1–0.9)
NEUTROS PCT: 59 %
Neutrophils Absolute: 3.2 10*3/uL (ref 1.4–7.0)
PLATELETS: 170 10*3/uL (ref 150–450)
RBC: 4.34 x10E6/uL (ref 4.14–5.80)
RDW: 14.2 % (ref 12.3–15.4)
WBC: 5.4 10*3/uL (ref 3.4–10.8)

## 2018-08-29 LAB — BASIC METABOLIC PANEL
BUN/Creatinine Ratio: 15 (ref 10–24)
BUN: 14 mg/dL (ref 8–27)
CALCIUM: 9.9 mg/dL (ref 8.6–10.2)
CO2: 26 mmol/L (ref 20–29)
Chloride: 105 mmol/L (ref 96–106)
Creatinine, Ser: 0.96 mg/dL (ref 0.76–1.27)
GFR calc Af Amer: 84 mL/min/{1.73_m2} (ref >59)
GFR calc non Af Amer: 73 mL/min/{1.73_m2} (ref >59)
GLUCOSE: 92 mg/dL (ref 65–99)
Potassium: 5 mmol/L (ref 3.5–5.2)
Sodium: 142 mmol/L (ref 134–144)

## 2018-08-29 LAB — LIPID PANEL W/O CHOL/HDL RATIO
Cholesterol, Total: 137 mg/dL (ref 100–199)
HDL: 74 mg/dL (ref >39)
LDL Calculated: 52 mg/dL (ref 0–99)
TRIGLYCERIDES: 57 mg/dL (ref 0–149)
VLDL CHOLESTEROL CAL: 11 mg/dL (ref 5–40)

## 2018-09-01 NOTE — Progress Notes (Signed)
[name] Date of Birth: 20-Nov-1933   History of Present Illness: [name] is seen today for followup AFib. He has a history of an anterior myocardial infarction in November 2009. This was treated with a drug-eluting stent to the mid LAD.  He had a stress Myoview study November 2012. He is able to walk for 8 minutes. He had no clinical symptoms. Images showed a fixed anterior septal and apical defect. There was no ischemia. Ejection fraction was 44%.  In November 2014 he was found to be in atrial fibrillation with a controlled response. He was started on Xarelto. Echo showed EF 40-45% with moderate biatrial enlargement.  On follow up today he is doing very well. He continues to enjoy travelling and is planning a [place] this [place]. He walks 3-4 miles/day. Denies any chest pain, SOB, palpitations. Notes mild  ankle swelling in the evening.    Current Outpatient Medications on File Prior to Visit  Medication Sig Dispense Refill  . amLODipine (NORVASC) 10 MG tablet Take 1 tablet (10 mg total) by mouth daily. 90 tablet 3  . calcium carbonate (OS-CAL) 600 MG TABS Take 600 mg by mouth 2 (two) times daily with a meal.      . Cholecalciferol (VITAMIN D3) 2000 UNITS TABS Take 2,000 Int'l Units by mouth daily.      . metoprolol tartrate (LOPRESSOR) 50 MG tablet Take 0.5 tablets (25 mg total) by mouth 2 (two) times daily. 90 tablet 3  . rivaroxaban (XARELTO) 20 MG TABS tablet Take 1 tablet (20 mg total) by mouth daily with supper. 90 tablet 3  . simvastatin (ZOCOR) 40 MG tablet Take 1 tablet (40 mg total) by mouth at bedtime. 90 tablet 3   No current facility-administered medications on file prior to visit.     No Known Allergies  Past Medical History:  Diagnosis Date  . Atrial fibrillation ([place])   . Coronary artery disease    Nov 2009-MI-stenting of the mid LAD drug-eluding stent  . Hx of radiation therapy 05/15/14- 07/11/14   prostate 7800 cGy 40 sessions, seminal  vesicles 5600 cGy 40 sessions  . Hyperlipidemia   . Hypertension   . LV dysfunction   . MI (myocardial infarction) ([place]) 1998  . Osteopenia   . Prostate CA ([place]) 05/05/2009   prostate cancer 11/09-tx with cryotherapy, Dr Gaynelle Arabian    Past Surgical History:  Procedure Laterality Date  . CARDIAC CATHETERIZATION     Nov 2009-drug eluding stent to mid LAD  . HERNIA REPAIR     inguinal  . PROSTATE BIOPSY  09/25/2008   gleason 4+3=7  . PROSTATE CRYOABLATION  05/05/2009   Dr Gaynelle Arabian    Social History   Tobacco Use  Smoking Status Never Smoker  Smokeless Tobacco Never Used    Social History   Substance and Sexual Activity  Alcohol Use No    Family History  Problem Relation Age of Onset  . Heart disease Mother   . Heart failure Mother   . Heart disease Brother   . Hematuria Father   . Cancer Father        prostate  . Heart disease Sister     Review of Systems: As noted in history of present illness.  All other systems were reviewed and are negative.  Physical Exam: BP 110/78   Pulse 65   Ht 5\' 11"  (1.803 m)   Wt 172 lb 6.4 oz (78.2 kg)   BMI 24.04 kg/m  GENERAL:  Well appearing elderly WM in NAD HEENT:  PERRL, EOMI, sclera are clear. Oropharynx is clear. NECK:  No jugular venous distention, carotid upstroke brisk and symmetric, no bruits, no thyromegaly or adenopathy LUNGS:  Clear to auscultation bilaterally CHEST:  Unremarkable HEART:  IRRR,  PMI not displaced or sustained,S1 and S2 within normal limits, no S3, no S4: no clicks, no rubs, no murmurs ABD:  Soft, nontender. BS +, no masses or bruits. No hepatomegaly, no splenomegaly EXT:  2 + pulses throughout, no edema, no cyanosis no clubbing SKIN:  Warm and dry.  No rashes NEURO:  Alert and oriented x 3. Cranial nerves II through XII intact. PSYCH:  Cognitively intact      LABORATORY DATA: Lab Results  Component Value Date   WBC 5.4 08/29/2018   HGB 14.2 08/29/2018   HCT 42.9 08/29/2018   PLT 170  08/29/2018   GLUCOSE 92 08/29/2018   CHOL 137 08/29/2018   TRIG 57 08/29/2018   HDL 74 08/29/2018   LDLCALC 52 08/29/2018   ALT 13 08/29/2018   AST 24 08/29/2018   NA 142 08/29/2018   K 5.0 08/29/2018   CL 105 08/29/2018   CREATININE 0.96 08/29/2018   BUN 14 08/29/2018   CO2 26 08/29/2018   TSH 2.02 11/14/2013   INR 1.1 11/01/2008    Assessment / Plan: 1. Coronary disease with remote anterior myocardial infarction 2009 treated with drug-eluting stent to the LAD.  Myoview study 11/12 showed no ischemia. He remains asymptomatic with good exercise tolerance. Will continue medical therapy with  metoprolol and statin therapy.   2. Hypertension, well controlled. Not a candidate for ACEi/ARB due to elevated potassium.  3. Hypercholesterolemia, on Zocor. Excellent control.  4. Left ventricular dysfunction. Ejection fraction 40-45% by Echo 2014. Unchanged from 2009. Well compensated  5. Atrial fibrillation. Rate is controlled on low-dose metoprolol. Patient is  asymptomatic. [place] score is 3. Continue Xarelto 20 mg daily.   I will follow up in 6 months

## 2018-09-04 ENCOUNTER — Encounter: Payer: Self-pay | Admitting: Cardiology

## 2018-09-04 ENCOUNTER — Other Ambulatory Visit: Payer: Self-pay | Admitting: Cardiology

## 2018-09-04 ENCOUNTER — Other Ambulatory Visit: Payer: Self-pay

## 2018-09-04 ENCOUNTER — Ambulatory Visit (INDEPENDENT_AMBULATORY_CARE_PROVIDER_SITE_OTHER): Payer: Medicare Other | Admitting: Cardiology

## 2018-09-04 VITALS — BP 110/78 | HR 65 | Ht 71.0 in | Wt 172.4 lb

## 2018-09-04 DIAGNOSIS — I482 Chronic atrial fibrillation, unspecified: Secondary | ICD-10-CM

## 2018-09-04 DIAGNOSIS — I2510 Atherosclerotic heart disease of native coronary artery without angina pectoris: Secondary | ICD-10-CM | POA: Diagnosis not present

## 2018-09-04 DIAGNOSIS — E7800 Pure hypercholesterolemia, unspecified: Secondary | ICD-10-CM | POA: Diagnosis not present

## 2018-09-04 DIAGNOSIS — I10 Essential (primary) hypertension: Secondary | ICD-10-CM | POA: Diagnosis not present

## 2018-09-04 MED ORDER — SIMVASTATIN 40 MG PO TABS: 40.0000 mg | ORAL_TABLET | Freq: Every day | ORAL | 3 refills | Status: DC

## 2018-09-04 MED ORDER — AMLODIPINE BESYLATE 10 MG PO TABS: 10.0000 mg | ORAL_TABLET | Freq: Every day | ORAL | 3 refills | Status: DC

## 2018-09-04 MED ORDER — METOPROLOL TARTRATE 50 MG PO TABS: 25.0000 mg | ORAL_TABLET | Freq: Two times a day (BID) | ORAL | 3 refills | Status: DC

## 2018-09-04 NOTE — Patient Instructions (Signed)
Continue your current therapy  I will see you in 6 months.   

## 2018-09-06 ENCOUNTER — Other Ambulatory Visit: Payer: Self-pay | Admitting: Cardiology

## 2018-12-04 ENCOUNTER — Telehealth: Payer: Self-pay | Admitting: Cardiology

## 2018-12-04 MED ORDER — RIVAROXABAN 20 MG PO TABS: ORAL_TABLET | ORAL | 0 refills | Status: DC

## 2018-12-04 NOTE — Telephone Encounter (Signed)
New Message   Patient calling the office for samples of medication:   1.  What medication and dosage are you requesting samples for?XARELTO 20 MG TABS tablet  2.  Are you currently out of this medication? no

## 2018-12-04 NOTE — Telephone Encounter (Signed)
Spoke to patient - samples are available for pick up

## 2019-03-10 NOTE — Progress Notes (Signed)
[name] Date of Birth: Apr 17, 1933   History of Present Illness: [name] is seen today for followup AFib. He has a history of an anterior myocardial infarction in November 2009. This was treated with a drug-eluting stent to the mid LAD.  He had a stress Myoview study November 2012. He is able to walk for 8 minutes. He had no clinical symptoms. Images showed a fixed anterior septal and apical defect. There was no ischemia. Ejection fraction was 44%.   In November 2014 he was found to be in atrial fibrillation with a controlled response. He was started on Xarelto. Echo showed EF 40-45% with moderate biatrial enlargement.  On follow up today he is doing very well. [place]  He walks 3-4 miles/day. He has no chest pain, SOB, palpitations. No bleeding.   Current Outpatient Medications on File Prior to Visit  Medication Sig Dispense Refill  . amLODipine (NORVASC) 10 MG tablet Take 1 tablet (10 mg total) by mouth daily. 90 tablet 3  . calcium carbonate (OS-CAL) 600 MG TABS Take 600 mg by mouth 2 (two) times daily with a meal.      . Cholecalciferol (VITAMIN D3) 2000 UNITS TABS Take 2,000 Int'l Units by mouth daily.      . metoprolol tartrate (LOPRESSOR) 50 MG tablet Take 0.5 tablets (25 mg total) by mouth 2 (two) times daily. 90 tablet 3  . rivaroxaban (XARELTO) 20 MG TABS tablet TAKE 1 TABLET BY MOUTH DAILY WITH SUPPER 14 tablet 0  . simvastatin (ZOCOR) 40 MG tablet Take 1 tablet (40 mg total) by mouth at bedtime. 90 tablet 3   No current facility-administered medications on file prior to visit.     No Known Allergies  Past Medical History:  Diagnosis Date  . Atrial fibrillation ([place])   . Coronary artery disease    Nov 2009-MI-stenting of the mid LAD drug-eluding stent  . Hx of radiation therapy 05/15/14- 07/11/14   prostate 7800 cGy 40 sessions, seminal vesicles 5600 cGy 40 sessions  . Hyperlipidemia   . Hypertension   . LV dysfunction   .  MI (myocardial infarction) ([place]) 1998  . Osteopenia   . Prostate CA ([place]) 05/05/2009   prostate cancer 11/09-tx with cryotherapy, Dr Gaynelle Arabian    Past Surgical History:  Procedure Laterality Date  . CARDIAC CATHETERIZATION     Nov 2009-drug eluding stent to mid LAD  . HERNIA REPAIR     inguinal  . PROSTATE BIOPSY  09/25/2008   gleason 4+3=7  . PROSTATE CRYOABLATION  05/05/2009   Dr Gaynelle Arabian    Social History   Tobacco Use  Smoking Status Never Smoker  Smokeless Tobacco Never Used    Social History   Substance and Sexual Activity  Alcohol Use No    Family History  Problem Relation Age of Onset  . Heart disease Mother   . Heart failure Mother   . Heart disease Brother   . Hematuria Father   . Cancer Father        prostate  . Heart disease Sister     Review of Systems: As noted in history of present illness.  All other systems were reviewed and are negative.  Physical Exam: BP 116/82 (BP Location: Right Arm, Patient Position: Sitting, Cuff Size: Normal)   Pulse (!) 104   Ht 5\' 10"  (1.778 m)   Wt 168 lb (76.2 kg)   SpO2 96%   BMI 24.11 kg/m  GENERAL:  Well appearing elderly WM in NAD HEENT:  PERRL, EOMI, sclera are clear. Oropharynx is clear. NECK:  No jugular venous distention, carotid upstroke brisk and symmetric, no bruits, no thyromegaly or adenopathy LUNGS:  Clear to auscultation bilaterally CHEST:  Unremarkable HEART:  IRRR,  PMI not displaced or sustained,S1 and S2 within normal limits, no S3, no S4: no clicks, no rubs, no murmurs ABD:  Soft, nontender. BS +, no masses or bruits. No hepatomegaly, no splenomegaly EXT:  2 + pulses throughout, no edema, no cyanosis no clubbing SKIN:  Warm and dry.  No rashes NEURO:  Alert and oriented x 3. Cranial nerves II through XII intact. PSYCH:  Cognitively intact   LABORATORY DATA: Lab Results  Component Value Date   WBC 5.4 08/29/2018   HGB 14.2 08/29/2018   HCT 42.9 08/29/2018   PLT 170 08/29/2018    GLUCOSE 92 08/29/2018   CHOL 137 08/29/2018   TRIG 57 08/29/2018   HDL 74 08/29/2018   LDLCALC 52 08/29/2018   ALT 13 08/29/2018   AST 24 08/29/2018   NA 142 08/29/2018   K 5.0 08/29/2018   CL 105 08/29/2018   CREATININE 0.96 08/29/2018   BUN 14 08/29/2018   CO2 26 08/29/2018   TSH 2.02 11/14/2013   INR 1.1 11/01/2008   Ecg today shows AFib with rate 59. LAD. Old septal infarct. I have personally reviewed and interpreted this study.  Assessment / Plan: 1. Coronary disease with remote anterior myocardial infarction 2009 treated with drug-eluting stent to the LAD.  Myoview study 11/12 showed no ischemia. He is asymptomatic. Will continue medical therapy.  2. Hypertension, well controlled. Not a candidate for ACEi/ARB due to elevated potassium.  3. Hypercholesterolemia, on Zocor. Excellent control.  4. Left ventricular dysfunction. Ejection fraction 40-45% by Echo 2014. Unchanged from 2009. Well compensated  5. Atrial fibrillation. Rate is controlled on low-dose metoprolol. Patient is  asymptomatic. [place] score is 3. Continue Xarelto 20 mg daily.   I will follow up in 6 months

## 2019-03-12 ENCOUNTER — Ambulatory Visit (INDEPENDENT_AMBULATORY_CARE_PROVIDER_SITE_OTHER): Payer: Medicare Other | Admitting: Cardiology

## 2019-03-12 ENCOUNTER — Encounter: Payer: Self-pay | Admitting: Cardiology

## 2019-03-12 ENCOUNTER — Other Ambulatory Visit: Payer: Self-pay

## 2019-03-12 VITALS — BP 116/82 | HR 104 | Ht 70.0 in | Wt 168.0 lb

## 2019-03-12 DIAGNOSIS — I2510 Atherosclerotic heart disease of native coronary artery without angina pectoris: Secondary | ICD-10-CM | POA: Diagnosis not present

## 2019-03-12 DIAGNOSIS — I482 Chronic atrial fibrillation, unspecified: Secondary | ICD-10-CM

## 2019-03-12 DIAGNOSIS — E7800 Pure hypercholesterolemia, unspecified: Secondary | ICD-10-CM | POA: Diagnosis not present

## 2019-03-12 DIAGNOSIS — I10 Essential (primary) hypertension: Secondary | ICD-10-CM | POA: Diagnosis not present

## 2019-10-08 NOTE — Progress Notes (Signed)
[name] Date of Birth: 05/11/1933   History of Present Illness: [name] is seen today for followup AFib. He has a history of an anterior myocardial infarction in November 2009. This was treated with a drug-eluting stent to the mid LAD.  He had a stress Myoview study November 2012. He is able to walk for 8 minutes. He had no clinical symptoms. Images showed a fixed anterior septal and apical defect. There was no ischemia. Ejection fraction was 44%.   In November 2014 he was found to be in atrial fibrillation with a controlled response. He was started on Xarelto. Echo showed EF 40-45% with moderate biatrial enlargement.  On follow up today he is doing very well.  He has no chest pain, SOB, palpitations. No bleeding. Continues to walk 3-4 miles/day   Current Outpatient Medications on File Prior to Visit  Medication Sig Dispense Refill  . calcium carbonate (OS-CAL) 600 MG TABS Take 600 mg by mouth 2 (two) times daily with a meal.      . Cholecalciferol (VITAMIN D3) 2000 UNITS TABS Take 2,000 Int'l Units by mouth daily.      . rivaroxaban (XARELTO) 20 MG TABS tablet TAKE 1 TABLET BY MOUTH DAILY WITH SUPPER 14 tablet 0   No current facility-administered medications on file prior to visit.     No Known Allergies  Past Medical History:  Diagnosis Date  . Atrial fibrillation ([place])   . Coronary artery disease    Nov 2009-MI-stenting of the mid LAD drug-eluding stent  . Hx of radiation therapy 05/15/14- 07/11/14   prostate 7800 cGy 40 sessions, seminal vesicles 5600 cGy 40 sessions  . Hyperlipidemia   . Hypertension   . LV dysfunction   . MI (myocardial infarction) ([place]) 1998  . Osteopenia   . Prostate CA ([place]) 05/05/2009   prostate cancer 11/09-tx with cryotherapy, Dr Gaynelle Arabian    Past Surgical History:  Procedure Laterality Date  . CARDIAC CATHETERIZATION     Nov 2009-drug eluding stent to mid LAD  . HERNIA REPAIR     inguinal  . PROSTATE BIOPSY  09/25/2008   gleason 4+3=7   . PROSTATE CRYOABLATION  05/05/2009   Dr Gaynelle Arabian    Social History   Tobacco Use  Smoking Status Never Smoker  Smokeless Tobacco Never Used    Social History   Substance and Sexual Activity  Alcohol Use No    Family History  Problem Relation Age of Onset  . Heart disease Mother   . Heart failure Mother   . Heart disease Brother   . Hematuria Father   . Cancer Father        prostate  . Heart disease Sister     Review of Systems: As noted in history of present illness.  All other systems were reviewed and are negative.  Physical Exam: BP 110/72   Pulse 88   Temp (!) 97 F (36.1 C) (Temporal)   Ht 5\' 8"  (1.727 m)   Wt 167 lb (75.8 kg)   SpO2 97%   BMI 25.39 kg/m  GENERAL:  Well appearing elderly WM in NAD HEENT:  PERRL, EOMI, sclera are clear. Oropharynx is clear. NECK:  No jugular venous distention, carotid upstroke brisk and symmetric, no bruits, no thyromegaly or adenopathy LUNGS:  Clear to auscultation bilaterally CHEST:  Unremarkable HEART:  IRRR,  PMI not displaced or sustained,S1 and S2 within normal limits, no S3, no S4: no clicks, no rubs, no murmurs ABD:  Soft, nontender. BS +,  no masses or bruits. No hepatomegaly, no splenomegaly EXT:  2 + pulses throughout, no edema, no cyanosis no clubbing SKIN:  Warm and dry.  No rashes NEURO:  Alert and oriented x 3. Cranial nerves II through XII intact. PSYCH:  Cognitively intact   LABORATORY DATA: Lab Results  Component Value Date   WBC 5.4 08/29/2018   HGB 14.2 08/29/2018   HCT 42.9 08/29/2018   PLT 170 08/29/2018   GLUCOSE 92 08/29/2018   CHOL 137 08/29/2018   TRIG 57 08/29/2018   HDL 74 08/29/2018   LDLCALC 52 08/29/2018   ALT 13 08/29/2018   AST 24 08/29/2018   NA 142 08/29/2018   K 5.0 08/29/2018   CL 105 08/29/2018   CREATININE 0.96 08/29/2018   BUN 14 08/29/2018   CO2 26 08/29/2018   TSH 2.02 11/14/2013   INR 1.1 11/01/2008   Labs dated 10/04/19: cholesterol 145, triglycerides 75,  HDL 68, LDL 61. CBC and chemistries normal   Assessment / Plan: 1. Coronary disease with remote anterior myocardial infarction 2009 treated with drug-eluting stent to the LAD.  Myoview study 11/12 showed no ischemia. He is asymptomatic. Will continue medical therapy.  2. Hypertension, well controlled. Avoid ACEi/ARB due to elevated potassium.  3. Hypercholesterolemia, on Zocor. Excellent control. At goal < 70  4. Left ventricular dysfunction. Ejection fraction 40-45% by Echo 2014. Unchanged from 2009. Well compensated  5. Atrial fibrillation. Rate is controlled on low-dose metoprolol. Patient is  asymptomatic. [place] score is 3. Continue Xarelto 20 mg daily.   I will follow up in 6 months

## 2019-10-10 ENCOUNTER — Encounter: Payer: Self-pay | Admitting: Cardiology

## 2019-10-10 ENCOUNTER — Ambulatory Visit (INDEPENDENT_AMBULATORY_CARE_PROVIDER_SITE_OTHER): Payer: Medicare Other | Admitting: Cardiology

## 2019-10-10 ENCOUNTER — Other Ambulatory Visit: Payer: Self-pay

## 2019-10-10 VITALS — BP 110/72 | HR 88 | Temp 97.0°F | Ht 68.0 in | Wt 167.0 lb

## 2019-10-10 DIAGNOSIS — I2510 Atherosclerotic heart disease of native coronary artery without angina pectoris: Secondary | ICD-10-CM | POA: Diagnosis not present

## 2019-10-10 DIAGNOSIS — E7800 Pure hypercholesterolemia, unspecified: Secondary | ICD-10-CM

## 2019-10-10 DIAGNOSIS — I10 Essential (primary) hypertension: Secondary | ICD-10-CM | POA: Diagnosis not present

## 2019-10-10 DIAGNOSIS — I482 Chronic atrial fibrillation, unspecified: Secondary | ICD-10-CM

## 2019-10-10 MED ORDER — SIMVASTATIN 40 MG PO TABS: 40.0000 mg | ORAL_TABLET | Freq: Every day | ORAL | 3 refills | Status: DC

## 2019-10-10 MED ORDER — METOPROLOL TARTRATE 50 MG PO TABS: 25.0000 mg | ORAL_TABLET | Freq: Two times a day (BID) | ORAL | 3 refills | Status: DC

## 2019-10-10 MED ORDER — AMLODIPINE BESYLATE 10 MG PO TABS: 10.0000 mg | ORAL_TABLET | Freq: Every day | ORAL | 3 refills | Status: DC

## 2020-01-01 ENCOUNTER — Other Ambulatory Visit: Payer: Self-pay

## 2020-01-01 MED ORDER — RIVAROXABAN 20 MG PO TABS: ORAL_TABLET | ORAL | 3 refills | Status: DC

## 2020-01-24 ENCOUNTER — Ambulatory Visit: Payer: Medicare Other

## 2020-01-29 ENCOUNTER — Ambulatory Visit: Payer: Medicare Other

## 2020-02-02 ENCOUNTER — Ambulatory Visit: Payer: Medicare Other | Attending: Internal Medicine

## 2020-02-02 DIAGNOSIS — Z23 Encounter for immunization: Secondary | ICD-10-CM

## 2020-02-02 NOTE — Progress Notes (Signed)
   Covid-19 Vaccination Clinic  Name:  [name]    MRN: [id number] DOB: 11-Jan-1933  02/02/2020  [name] was observed post Covid-19 immunization for 15 minutes without incidence. He was provided with Vaccine Information Sheet and instruction to access the V-Safe system.   [name] was instructed to call 911 with any severe reactions post vaccine: Marland Kitchen Difficulty breathing  . Swelling of your face and throat  . A fast heartbeat  . A bad rash all over your body  . Dizziness and weakness    Immunizations Administered    Name Date Dose VIS Date Route   Pfizer COVID-19 Vaccine 02/02/2020  9:05 AM 0.3 mL 12/07/2019 Intramuscular   Manufacturer: [place]   Lot: [id number]   [place]: [id number]

## 2020-02-26 ENCOUNTER — Ambulatory Visit: Payer: Medicare Other | Attending: Internal Medicine

## 2020-02-26 ENCOUNTER — Ambulatory Visit: Payer: Medicare Other

## 2020-02-26 DIAGNOSIS — Z23 Encounter for immunization: Secondary | ICD-10-CM | POA: Insufficient documentation

## 2020-02-26 NOTE — Progress Notes (Signed)
   Covid-19 Vaccination Clinic  Name:  [name]    MRN: [id number] DOB: 12-Aug-1933  02/26/2020  [name] was observed post Covid-19 immunization for 15 minutes without incident. He was provided with Vaccine Information Sheet and instruction to access the V-Safe system.   Mr. Zero was instructed to call 911 with any severe reactions post vaccine: Marland Kitchen Difficulty breathing  . Swelling of face and throat  . A fast heartbeat  . A bad rash all over body  . Dizziness and weakness   Immunizations Administered    Name Date Dose VIS Date Route   Pfizer COVID-19 Vaccine 02/26/2020  1:16 PM 0.3 mL 12/07/2019 Intramuscular   Manufacturer: [place]   Lot: [id number]   [place]: [id number]

## 2020-04-13 NOTE — Progress Notes (Signed)
[name] Date of Birth: [birth year]   History of Present Illness: [name] is seen today for followup AFib. He has a history of an anterior myocardial infarction in November 2009. This was treated with a drug-eluting stent to the mid LAD.  He had a stress Myoview study November 2012. He is able to walk for 8 minutes. He had no clinical symptoms. Images showed a fixed anterior septal and apical defect. There was no ischemia. Ejection fraction was 44%.   In November 2014 he was found to be in atrial fibrillation with a controlled response. He was started on Xarelto. Echo showed EF 40-45% with moderate biatrial enlargement.  On follow up today he is doing very well.  He has no chest pain, SOB, palpitations. No bleeding. Continues to walk 4-6 miles/day. He denies any dizziness. Energy level is very good.    Current Outpatient Medications on File Prior to Visit  Medication Sig Dispense Refill  . amLODipine (NORVASC) 10 MG tablet Take 1 tablet (10 mg total) by mouth daily. 90 tablet 3  . calcium carbonate (OS-CAL) 600 MG TABS Take 600 mg by mouth 2 (two) times daily with a meal.      . Cholecalciferol (VITAMIN D3) 2000 UNITS TABS Take 2,000 Int'l Units by mouth daily.      . metoprolol tartrate (LOPRESSOR) 50 MG tablet Take 0.5 tablets (25 mg total) by mouth 2 (two) times daily. 90 tablet 3  . rivaroxaban (XARELTO) 20 MG TABS tablet TAKE 1 TABLET BY MOUTH DAILY WITH SUPPER 90 tablet 3  . simvastatin (ZOCOR) 40 MG tablet Take 1 tablet (40 mg total) by mouth at bedtime. 90 tablet 3   No current facility-administered medications on file prior to visit.    No Known Allergies  Past Medical History:  Diagnosis Date  . Atrial fibrillation ([place])   . Coronary artery disease    Nov 2009-MI-stenting of the mid LAD drug-eluding stent  . Hx of radiation therapy 05/15/14- 07/11/14   prostate 7800 cGy 40 sessions, seminal vesicles 5600 cGy 40 sessions  . Hyperlipidemia   . Hypertension   . LV  dysfunction   . MI (myocardial infarction) ([place]) 1998  . Osteopenia   . Prostate CA ([place]) 05/05/2009   prostate cancer 11/09-tx with cryotherapy, Dr Gaynelle Arabian    Past Surgical History:  Procedure Laterality Date  . CARDIAC CATHETERIZATION     Nov 2009-drug eluding stent to mid LAD  . HERNIA REPAIR     inguinal  . PROSTATE BIOPSY  09/25/2008   gleason 4+3=7  . PROSTATE CRYOABLATION  05/05/2009   Dr Gaynelle Arabian    Social History   Tobacco Use  Smoking Status Never Smoker  Smokeless Tobacco Never Used    Social History   Substance and Sexual Activity  Alcohol Use No    Family History  Problem Relation Age of Onset  . Heart disease Mother   . Heart failure Mother   . Heart disease Brother   . Hematuria Father   . Cancer Father        prostate  . Heart disease Sister     Review of Systems: As noted in history of present illness.  All other systems were reviewed and are negative.  Physical Exam: BP (!) 143/91   Pulse (!) 59   Ht 5\' 10"  (1.778 m)   Wt 170 lb (77.1 kg)   SpO2 96%   BMI 24.39 kg/m  GENERAL:  Well appearing elderly WM in NAD HEENT:  PERRL, EOMI, sclera are clear. Oropharynx is clear. NECK:  No jugular venous distention, carotid upstroke brisk and symmetric, no bruits, no thyromegaly or adenopathy LUNGS:  Clear to auscultation bilaterally CHEST:  Unremarkable HEART:  IRRR,  PMI not displaced or sustained,S1 and S2 within normal limits, no S3, no S4: no clicks, no rubs, no murmurs ABD:  Soft, nontender. BS +, no masses or bruits. No hepatomegaly, no splenomegaly EXT:  2 + pulses throughout, no edema, no cyanosis no clubbing SKIN:  Warm and dry.  No rashes NEURO:  Alert and oriented x 3. Cranial nerves II through XII intact. PSYCH:  Cognitively intact   LABORATORY DATA: Lab Results  Component Value Date   WBC 5.4 08/29/2018   HGB 14.2 08/29/2018   HCT 42.9 08/29/2018   PLT 170 08/29/2018   GLUCOSE 92 08/29/2018   CHOL 137 08/29/2018   TRIG  57 08/29/2018   HDL 74 08/29/2018   LDLCALC 52 08/29/2018   ALT 13 08/29/2018   AST 24 08/29/2018   NA 142 08/29/2018   K 5.0 08/29/2018   CL 105 08/29/2018   CREATININE 0.96 08/29/2018   BUN 14 08/29/2018   CO2 26 08/29/2018   TSH 2.02 11/14/2013   INR 1.1 11/01/2008   Labs dated 10/04/19: cholesterol 145, triglycerides 75, HDL 68, LDL 61. CBC and chemistries normal  Ecg today shows Afib with rate 59. LAD. Old septal infarct. I have personally reviewed and interpreted this study.   Assessment / Plan: 1. Coronary disease with remote anterior myocardial infarction 2009 treated with drug-eluting stent to the LAD.  Myoview study 11/12 showed no ischemia. He is asymptomatic. Will continue medical therapy.  2. Hypertension, well controlled. Avoid ACEi/ARB due to history of elevated potassium.  3. Hypercholesterolemia, on Zocor. Excellent control. At goal  LDL 61  4. Left ventricular dysfunction. Ejection fraction 40-45% by Echo 2014. Unchanged from 2009. Well compensated  5. Atrial fibrillation. Rate is controlled on low-dose metoprolol. Patient is  asymptomatic. [place] score is 3. Continue Xarelto 20 mg daily. If he should develop any fatigue/weakness/dizziness we could reduce metoprolol dose.  I will follow up in 6 months

## 2020-04-22 ENCOUNTER — Ambulatory Visit (INDEPENDENT_AMBULATORY_CARE_PROVIDER_SITE_OTHER): Payer: Medicare Other | Admitting: Cardiology

## 2020-04-22 ENCOUNTER — Other Ambulatory Visit: Payer: Self-pay

## 2020-04-22 ENCOUNTER — Encounter: Payer: Self-pay | Admitting: Cardiology

## 2020-04-22 VITALS — BP 143/91 | HR 59 | Ht 70.0 in | Wt 170.0 lb

## 2020-04-22 DIAGNOSIS — I10 Essential (primary) hypertension: Secondary | ICD-10-CM | POA: Diagnosis not present

## 2020-04-22 DIAGNOSIS — E7800 Pure hypercholesterolemia, unspecified: Secondary | ICD-10-CM | POA: Diagnosis not present

## 2020-04-22 DIAGNOSIS — I2510 Atherosclerotic heart disease of native coronary artery without angina pectoris: Secondary | ICD-10-CM | POA: Diagnosis not present

## 2020-04-22 DIAGNOSIS — I482 Chronic atrial fibrillation, unspecified: Secondary | ICD-10-CM

## 2020-10-02 ENCOUNTER — Ambulatory Visit (INDEPENDENT_AMBULATORY_CARE_PROVIDER_SITE_OTHER): Payer: Medicare Other | Admitting: Cardiology

## 2020-10-02 ENCOUNTER — Other Ambulatory Visit: Payer: Self-pay

## 2020-10-02 ENCOUNTER — Encounter: Payer: Self-pay | Admitting: Cardiology

## 2020-10-02 VITALS — BP 122/76 | HR 66 | Ht 70.0 in | Wt 168.8 lb

## 2020-10-02 DIAGNOSIS — Z9861 Coronary angioplasty status: Secondary | ICD-10-CM

## 2020-10-02 DIAGNOSIS — Z7901 Long term (current) use of anticoagulants: Secondary | ICD-10-CM | POA: Diagnosis not present

## 2020-10-02 DIAGNOSIS — E785 Hyperlipidemia, unspecified: Secondary | ICD-10-CM

## 2020-10-02 DIAGNOSIS — Z8546 Personal history of malignant neoplasm of prostate: Secondary | ICD-10-CM

## 2020-10-02 DIAGNOSIS — I482 Chronic atrial fibrillation, unspecified: Secondary | ICD-10-CM | POA: Diagnosis not present

## 2020-10-02 DIAGNOSIS — I2510 Atherosclerotic heart disease of native coronary artery without angina pectoris: Secondary | ICD-10-CM

## 2020-10-02 DIAGNOSIS — I252 Old myocardial infarction: Secondary | ICD-10-CM | POA: Diagnosis not present

## 2020-10-02 DIAGNOSIS — I10 Essential (primary) hypertension: Secondary | ICD-10-CM

## 2020-10-02 DIAGNOSIS — I255 Ischemic cardiomyopathy: Secondary | ICD-10-CM | POA: Diagnosis not present

## 2020-10-02 MED ORDER — SIMVASTATIN 40 MG PO TABS: 40.0000 mg | ORAL_TABLET | Freq: Every day | ORAL | 3 refills | Status: DC

## 2020-10-02 MED ORDER — METOPROLOL TARTRATE 50 MG PO TABS: 25.0000 mg | ORAL_TABLET | Freq: Two times a day (BID) | ORAL | 3 refills | Status: DC

## 2020-10-02 MED ORDER — AMLODIPINE BESYLATE 10 MG PO TABS: 10.0000 mg | ORAL_TABLET | Freq: Every day | ORAL | 3 refills | Status: DC

## 2020-10-02 NOTE — Assessment & Plan Note (Signed)
Nov 2009-MI-stenting of the mid LAD with a drug-eluting stent. This was a 2.75 x 28 mm Promus stent. Low risk Myoview 2012

## 2020-10-02 NOTE — Assessment & Plan Note (Signed)
Anterior MI Nov 2009

## 2020-10-02 NOTE — Assessment & Plan Note (Signed)
Xarelto- CHADs VASc= 4

## 2020-10-02 NOTE — Progress Notes (Signed)
Cardiology Office Note:    Date:  10/02/2020   ID:  [name], DOB 07-14-1933, MRN [id number]  PCP:  Leighton Ruff, MD  Cardiologist:  No primary care provider on file.  Electrophysiologist:  None   Referring MD: Leighton Ruff, MD   No chief complaint on file.   History of Present Illness:    [name] is a 86 y.o. male with a hx of remote anterior MI November 2009.  This was treated with LAD PCI and DES.  He had a low risk Myoview in 2012 and has not had angina since.  In 2014 he was noted to have atrial fibrillation.  He has been treated with rate control and is on Xarelto.  He has treated hypertension, he was intolerant to an ACE inhibitor in the past secondary to hyperkalemia.  His primary care provider has him on statin therapy for dyslipidemia.    The patient is in the office today for 37-month follow-up.  Since he was last here he is done well from a cardiac standpoint.  He walks 4 to 5 miles a day.  He denies any exertional chest pain or shortness of breath.  He has chronic atrial fibrillation with rate control and is unaware of his heart rhythm being irregular.  He had no problems with his medications.  His blood pressures been controlled.  Past Medical History:  Diagnosis Date  . Atrial fibrillation ([place])   . Coronary artery disease    Nov 2009-MI-stenting of the mid LAD drug-eluding stent  . Hx of radiation therapy 05/15/14- 07/11/14   prostate 7800 cGy 40 sessions, seminal vesicles 5600 cGy 40 sessions  . Hyperlipidemia   . Hypertension   . LV dysfunction   . MI (myocardial infarction) ([place]) 1998  . Osteopenia   . Prostate CA ([place]) 05/05/2009   prostate cancer 11/09-tx with cryotherapy, Dr Gaynelle Arabian    Past Surgical History:  Procedure Laterality Date  . CARDIAC CATHETERIZATION     Nov 2009-drug eluding stent to mid LAD  . HERNIA REPAIR     inguinal  . PROSTATE BIOPSY  09/25/2008   gleason 4+3=7  . PROSTATE CRYOABLATION  05/05/2009   Dr  Gaynelle Arabian    Current Medications: Current Meds  Medication Sig  . amLODipine (NORVASC) 10 MG tablet Take 1 tablet (10 mg total) by mouth daily.  . calcium carbonate (OS-CAL) 600 MG TABS Take 600 mg by mouth 2 (two) times daily with a meal.    . Cholecalciferol (VITAMIN D3) 2000 UNITS TABS Take 2,000 Int'l Units by mouth daily.    . metoprolol tartrate (LOPRESSOR) 50 MG tablet Take 0.5 tablets (25 mg total) by mouth 2 (two) times daily.  . rivaroxaban (XARELTO) 20 MG TABS tablet TAKE 1 TABLET BY MOUTH DAILY WITH SUPPER  . simvastatin (ZOCOR) 40 MG tablet Take 1 tablet (40 mg total) by mouth at bedtime.  . [DISCONTINUED] amLODipine (NORVASC) 10 MG tablet Take 1 tablet (10 mg total) by mouth daily.  . [DISCONTINUED] metoprolol tartrate (LOPRESSOR) 50 MG tablet Take 0.5 tablets (25 mg total) by mouth 2 (two) times daily.  . [DISCONTINUED] simvastatin (ZOCOR) 40 MG tablet Take 1 tablet (40 mg total) by mouth at bedtime.     Allergies:   Patient has no known allergies.   Social History   Socioeconomic History  . Marital status: Married    Spouse name: Not on file  . Number of children: 1  . Years of education: Not on file  .  Highest education level: Not on file  Occupational History  . Occupation: Curator: RETIRED  Tobacco Use  . Smoking status: Never Smoker  . Smokeless tobacco: Never Used  Substance and Sexual Activity  . Alcohol use: No  . Drug use: No  . Sexual activity: Not on file  Other Topics Concern  . Not on file  Social History Narrative  . Not on file   Social Determinants of Health   Financial Resource Strain:   . Difficulty of Paying Living Expenses: Not on file  Food Insecurity:   . Worried About Charity fundraiser in the Last Year: Not on file  . Ran Out of Food in the Last Year: Not on file  Transportation Needs:   . Lack of Transportation (Medical): Not on file  . Lack of Transportation (Non-Medical): Not on file  Physical Activity:   .  Days of Exercise per Week: Not on file  . Minutes of Exercise per Session: Not on file  Stress:   . Feeling of Stress : Not on file  Social Connections:   . Frequency of Communication with Friends and Family: Not on file  . Frequency of Social Gatherings with Friends and Family: Not on file  . Attends Religious Services: Not on file  . Active Member of Clubs or Organizations: Not on file  . Attends Archivist Meetings: Not on file  . Marital Status: Not on file     Family History: The patient's family history includes Cancer in his father; Heart disease in his brother, mother, and sister; Heart failure in his mother; Hematuria in his father.  ROS:   Please see the history of present illness.     All other systems reviewed and are negative.  EKGs/Labs/Other Studies Reviewed:    The following studies were reviewed today: Echo 11/28/2013- Study Conclusions   - Left ventricle: The cavity size was normal. Wall thickness  was increased in a pattern of mild LVH. Systolic function  was mildly to moderately reduced. The estimated ejection  fraction was in the range of 40% to 45%. Cannot exclude  hypokinesis of the mid-distalanteroseptal myocardium.  - Mitral valve: Moderate regurgitation.  - Left atrium: The atrium was moderately dilated.  - Right ventricle: The cavity size was mildly dilated.  - Right atrium: The atrium was moderately dilated.  - Tricuspid valve: Moderate regurgitation.  - Pulmonary arteries: PA peak pressure: 39mm Hg (S).  Transthoracic echocardiography. M-mode, complete 2D,  spectral Doppler, and color Doppler. Height: Height:  172.7cm. Height: 68in. Weight: Weight: 74.8kg. Weight:  164.7lb. Body mass index: BMI: 25.1kg/m^2. Body surface  area:  BSA: 1.7m^2. Blood pressure:   120/79. Patient  status: Outpatient. Location: [facility]    EKG:  EKG is not ordered today.  The ekg ordered 04/23/2019 demonstrates AF with  CVR  Recent Labs: No results found for requested labs within last 8760 hours.  Recent Lipid Panel    Component Value Date/Time   CHOL 137 08/29/2018 0917   TRIG 57 08/29/2018 0917   HDL 74 08/29/2018 0917   CHOLHDL 1.9 06/07/2017 0959   VLDL 10 06/07/2017 0959   LDLCALC 52 08/29/2018 0917    Physical Exam:    VS:  BP 122/76   Pulse 66   Ht 5\' 10"  (1.778 m)   Wt 168 lb 12.8 oz (76.6 kg)   SpO2 96%   BMI 24.22 kg/m     Wt Readings from Last  3 Encounters:  10/02/20 168 lb 12.8 oz (76.6 kg)  04/22/20 170 lb (77.1 kg)  10/10/19 167 lb (75.8 kg)     GEN:  Well nourished, well developed in no acute distress HEENT: Normal NECK: No JVD; No carotid bruits CARDIAC: irregularly irregular, no murmurs, rubs, gallops RESPIRATORY:  Clear to auscultation without rales, wheezing or rhonchi  ABDOMEN: Soft, non-tender, non-distended MUSCULOSKELETAL:  No edema; No deformity  SKIN: Warm and dry NEUROLOGIC:  Alert and oriented x 3 PSYCHIATRIC:  Normal affect   ASSESSMENT:    CAD S/P percutaneous coronary angioplasty Nov 2009-MI-stenting of the mid LAD with a drug-eluting stent. This was a 2.75 x 28 mm Promus stent. Low risk Myoview 2012  Chronic atrial fibrillation ([place]) Onset 2014- rate control with BB  Anticoagulated Xarelto- CHADs VASc= 4  Ischemic cardiomyopathy EF 40-45% 2014 echo  History of myocardial infarction Anterior MI Nov 2009  Essential hypertension Controlled  Dyslipidemia, goal LDL below 70 PCP follows  PLAN:    Meds refilled.  F/U Dr Martinique in 6 months.   Medication Adjustments/Labs and Tests Ordered: Current medicines are reviewed at length with the patient today.  Concerns regarding medicines are outlined above.  No orders of the defined types were placed in this encounter.  Meds ordered this encounter  Medications  . amLODipine (NORVASC) 10 MG tablet    Sig: Take 1 tablet (10 mg total) by mouth daily.    Dispense:  90 tablet    Refill:  3   . metoprolol tartrate (LOPRESSOR) 50 MG tablet    Sig: Take 0.5 tablets (25 mg total) by mouth 2 (two) times daily.    Dispense:  90 tablet    Refill:  3  . simvastatin (ZOCOR) 40 MG tablet    Sig: Take 1 tablet (40 mg total) by mouth at bedtime.    Dispense:  90 tablet    Refill:  3    Patient Instructions  Medication Instructions:  Continue current medications  *If you need a refill on your cardiac medications before your next appointment, please call your pharmacy*   Lab Work: None Ordered  Testing/Procedures: None Ordered   Follow-Up: At [facility], you and your health needs are our priority.  As part of our continuing mission to provide you with exceptional heart care, we have created designated Provider Care Teams.  These Care Teams include your primary Cardiologist (physician) and Advanced Practice Providers (APPs -  Physician Assistants and Nurse Practitioners) who all work together to provide you with the care you need, when you need it.  We recommend signing up for the patient portal called "MyChart".  Sign up information is provided on this After Visit Summary.  MyChart is used to connect with patients for Virtual Visits (Telemedicine).  Patients are able to view lab/test results, encounter notes, upcoming appointments, etc.  Non-urgent messages can be sent to your provider as well.   To learn more about what you can do with MyChart, go to [email or web address].    Your next appointment:   6 month(s)  The format for your next appointment:   In Person  Provider:   You may see Peter, [place], MD or one of the following Advanced Practice Providers on your designated Care Team:    Almyra Deforest, PA-C  Fabian Sharp, PA-C or   Roby Lofts, PA-C         Signed, Kerin Ransom, [place]  10/02/2020 11:10 AM    [place]

## 2020-10-02 NOTE — Assessment & Plan Note (Signed)
PCP follows 

## 2020-10-02 NOTE — Assessment & Plan Note (Signed)
Onset 2014- rate control with BB

## 2020-10-02 NOTE — Assessment & Plan Note (Signed)
EF 40-45% 2014 echo

## 2020-10-02 NOTE — Patient Instructions (Signed)
Medication Instructions:  Continue current medications  *If you need a refill on your cardiac medications before your next appointment, please call your pharmacy*   Lab Work: None Ordered  Testing/Procedures: None Ordered   Follow-Up: At [facility], you and your health needs are our priority.  As part of our continuing mission to provide you with exceptional heart care, we have created designated Provider Care Teams.  These Care Teams include your primary Cardiologist (physician) and Advanced Practice Providers (APPs -  Physician Assistants and Nurse Practitioners) who all work together to provide you with the care you need, when you need it.  We recommend signing up for the patient portal called "MyChart".  Sign up information is provided on this After Visit Summary.  MyChart is used to connect with patients for Virtual Visits (Telemedicine).  Patients are able to view lab/test results, encounter notes, upcoming appointments, etc.  Non-urgent messages can be sent to your provider as well.   To learn more about what you can do with MyChart, go to [email or web address].    Your next appointment:   6 month(s)  The format for your next appointment:   In Person  Provider:   You may see Peter, [place], MD or one of the following Advanced Practice Providers on your designated Care Team:    Almyra Deforest, PA-C  Fabian Sharp, PA-C or   Roby Lofts, [place]

## 2020-10-02 NOTE — Assessment & Plan Note (Signed)
Controlled.  

## 2020-10-14 ENCOUNTER — Other Ambulatory Visit (HOSPITAL_BASED_OUTPATIENT_CLINIC_OR_DEPARTMENT_OTHER): Payer: Self-pay | Admitting: Internal Medicine

## 2020-10-14 ENCOUNTER — Ambulatory Visit: Payer: Medicare Other | Attending: Internal Medicine

## 2020-10-14 DIAGNOSIS — Z23 Encounter for immunization: Secondary | ICD-10-CM

## 2020-10-14 NOTE — Progress Notes (Signed)
   Covid-19 Vaccination Clinic  Name:  [name]    MRN: [id number] DOB: 08/04/1933  10/14/2020  [name] was observed post Covid-19 immunization for 15 minutes without incident. He was provided with Vaccine Information Sheet and instruction to access the V-Safe system.   [name] was instructed to call 911 with any severe reactions post vaccine: Marland Kitchen Difficulty breathing  . Swelling of face and throat  . A fast heartbeat  . A bad rash all over body  . Dizziness and weakness

## 2020-10-21 MED FILL — PFIZER-BIONTECH COVID-19 VA: 30 | 1 days supply | Qty: 0 | Fill #0

## 2020-10-28 ENCOUNTER — Telehealth: Payer: Self-pay | Admitting: *Deleted

## 2020-10-28 NOTE — Telephone Encounter (Signed)
Patient called in requesting samples of Xarelto 20 mg once daily.  Medication samples have been provided to the patient.  Drug name: Xarelto  Qty: 2 bottles  LOT: [id number]  Exp.Date: 8/23  Samples left at front desk for patient pick-up. Patient notified.

## 2020-11-06 ENCOUNTER — Ambulatory Visit: Payer: Medicare Other | Admitting: Cardiology

## 2021-01-22 ENCOUNTER — Other Ambulatory Visit: Payer: Self-pay | Admitting: Cardiology

## 2021-01-22 MED ORDER — RIVAROXABAN 20 MG PO TABS: ORAL_TABLET | ORAL | 3 refills | Status: DC

## 2021-04-03 NOTE — Progress Notes (Signed)
[name] Date of Birth: 29-Jan-1933   History of Present Illness: [name] is seen today for followup AFib. He has a history of an anterior myocardial infarction in November 2009. This was treated with a drug-eluting stent to the mid LAD.  He had a stress Myoview study November 2012. He is able to walk for 8 minutes. He had no clinical symptoms. Images showed a fixed anterior septal and apical defect. There was no ischemia. Ejection fraction was 44%.   In November 2014 he was found to be in atrial fibrillation with a controlled response. He was started on Xarelto. Echo showed EF 40-45% with moderate biatrial enlargement.  On follow up today he is doing very well.  He has no chest pain, SOB, palpitations. No bleeding. Continues to walk 4-6 miles/day. He denies any dizziness. Energy level is very good.    Current Outpatient Medications on File Prior to Visit  Medication Sig Dispense Refill  . amLODipine (NORVASC) 10 MG tablet Take 1 tablet (10 mg total) by mouth daily. 90 tablet 3  . calcium carbonate (OS-CAL) 600 MG TABS Take 600 mg by mouth 2 (two) times daily with a meal.    . Cholecalciferol (VITAMIN D3) 2000 UNITS TABS Take 2,000 Int'l Units by mouth daily.    Marland Kitchen COVID-19 mRNA vaccine, Pfizer, 30 MCG/0.3ML injection INJECT AS DIRECTED .3 mL 0  . metoprolol tartrate (LOPRESSOR) 50 MG tablet Take 0.5 tablets (25 mg total) by mouth 2 (two) times daily. 90 tablet 3  . rivaroxaban (XARELTO) 20 MG TABS tablet TAKE 1 TABLET BY MOUTH DAILY WITH SUPPER 90 tablet 3  . simvastatin (ZOCOR) 40 MG tablet Take 1 tablet (40 mg total) by mouth at bedtime. 90 tablet 3   No current facility-administered medications on file prior to visit.    No Known Allergies  Past Medical History:  Diagnosis Date  . Atrial fibrillation ([place])   . Coronary artery disease    Nov 2009-MI-stenting of the mid LAD drug-eluding stent  . Hx of radiation therapy 05/15/14- 07/11/14   prostate 7800 cGy 40 sessions, seminal  vesicles 5600 cGy 40 sessions  . Hyperlipidemia   . Hypertension   . LV dysfunction   . MI (myocardial infarction) ([place]) 1998  . Osteopenia   . Prostate CA ([place]) 05/05/2009   prostate cancer 11/09-tx with cryotherapy, Dr Gaynelle Arabian    Past Surgical History:  Procedure Laterality Date  . CARDIAC CATHETERIZATION     Nov 2009-drug eluding stent to mid LAD  . HERNIA REPAIR     inguinal  . PROSTATE BIOPSY  09/25/2008   gleason 4+3=7  . PROSTATE CRYOABLATION  05/05/2009   Dr Gaynelle Arabian    Social History   Tobacco Use  Smoking Status Never Smoker  Smokeless Tobacco Never Used    Social History   Substance and Sexual Activity  Alcohol Use No    Family History  Problem Relation Age of Onset  . Heart disease Mother   . Heart failure Mother   . Heart disease Brother   . Hematuria Father   . Cancer Father        prostate  . Heart disease Sister     Review of Systems: As noted in history of present illness.  All other systems were reviewed and are negative.  Physical Exam: BP 110/62   Pulse 60   Ht 5\' 10"  (1.778 m)   Wt 172 lb 3.2 oz (78.1 kg)   SpO2 94%   BMI 24.71  kg/m  GENERAL:  Well appearing elderly WM in NAD HEENT:  PERRL, EOMI, sclera are clear. Oropharynx is clear. NECK:  No jugular venous distention, carotid upstroke brisk and symmetric, no bruits, no thyromegaly or adenopathy LUNGS:  Clear to auscultation bilaterally CHEST:  Unremarkable HEART:  IRRR,  PMI not displaced or sustained,S1 and S2 within normal limits, no S3, no S4: no clicks, no rubs, no murmurs ABD:  Soft, nontender. BS +, no masses or bruits. No hepatomegaly, no splenomegaly EXT:  2 + pulses throughout, no edema, no cyanosis no clubbing SKIN:  Warm and dry.  No rashes NEURO:  Alert and oriented x 3. Cranial nerves II through XII intact. PSYCH:  Cognitively intact   LABORATORY DATA: Lab Results  Component Value Date   WBC 5.4 08/29/2018   HGB 14.2 08/29/2018   HCT 42.9 08/29/2018    PLT 170 08/29/2018   GLUCOSE 92 08/29/2018   CHOL 137 08/29/2018   TRIG 57 08/29/2018   HDL 74 08/29/2018   LDLCALC 52 08/29/2018   ALT 13 08/29/2018   AST 24 08/29/2018   NA 142 08/29/2018   K 5.0 08/29/2018   CL 105 08/29/2018   CREATININE 0.96 08/29/2018   BUN 14 08/29/2018   CO2 26 08/29/2018   TSH 2.02 11/14/2013   INR 1.1 11/01/2008   Labs dated 10/04/19: cholesterol 145, triglycerides 75, HDL 68, LDL 61. CBC and chemistries normal Dated 10/21/20: cholesterol 141, triglycerides 64, HDL 71, LDL 56. CBC normal Dated 11/17/20: potassium 5.4 otherwise CMET normal.  Ecg today shows Afib with rate 60. LAD. Old septal infarct. I have personally reviewed and interpreted this study.   Assessment / Plan: 1. Coronary disease with remote anterior myocardial infarction 2009 treated with drug-eluting stent to the LAD.  Myoview study 11/12 showed no ischemia. He is asymptomatic. Will continue medical therapy.  2. Hypertension, well controlled. Avoid ACEi/ARB due to history of elevated potassium.  3. Hypercholesterolemia, on Zocor. Excellent control. At goal  LDL 56  4. Left ventricular dysfunction. Ejection fraction 40-45% by Echo 2014. Unchanged from 2009. Well compensated  5. Atrial fibrillation. Rate is controlled on low-dose metoprolol. Patient is  asymptomatic. [place] score is 3. Continue Xarelto 20 mg daily. If he should develop any fatigue/weakness/dizziness we could reduce metoprolol dose.  I will follow up in 6 months

## 2021-04-09 ENCOUNTER — Other Ambulatory Visit: Payer: Self-pay

## 2021-04-09 ENCOUNTER — Encounter: Payer: Self-pay | Admitting: Cardiology

## 2021-04-09 ENCOUNTER — Ambulatory Visit (INDEPENDENT_AMBULATORY_CARE_PROVIDER_SITE_OTHER): Payer: Medicare Other | Admitting: Cardiology

## 2021-04-09 VITALS — BP 110/62 | HR 60 | Ht 70.0 in | Wt 172.2 lb

## 2021-04-09 DIAGNOSIS — Z7901 Long term (current) use of anticoagulants: Secondary | ICD-10-CM

## 2021-04-09 DIAGNOSIS — Z9861 Coronary angioplasty status: Secondary | ICD-10-CM

## 2021-04-09 DIAGNOSIS — I2510 Atherosclerotic heart disease of native coronary artery without angina pectoris: Secondary | ICD-10-CM | POA: Diagnosis not present

## 2021-04-09 DIAGNOSIS — I10 Essential (primary) hypertension: Secondary | ICD-10-CM

## 2021-04-09 DIAGNOSIS — E785 Hyperlipidemia, unspecified: Secondary | ICD-10-CM

## 2021-04-09 DIAGNOSIS — I482 Chronic atrial fibrillation, unspecified: Secondary | ICD-10-CM | POA: Diagnosis not present

## 2021-04-09 NOTE — Patient Instructions (Signed)
Medication Instructions:  Continue current medications  *If you need a refill on your cardiac medications before your next appointment, please call your pharmacy*   Lab Work: None Ordered   Testing/Procedures: None Ordered   Follow-Up: At [facility], you and your health needs are our priority.  As part of our continuing mission to provide you with exceptional heart care, we have created designated Provider Care Teams.  These Care Teams include your primary Cardiologist (physician) and Advanced Practice Providers (APPs -  Physician Assistants and Nurse Practitioners) who all work together to provide you with the care you need, when you need it.  We recommend signing up for the patient portal called "MyChart".  Sign up information is provided on this After Visit Summary.  MyChart is used to connect with patients for Virtual Visits (Telemedicine).  Patients are able to view lab/test results, encounter notes, upcoming appointments, etc.  Non-urgent messages can be sent to your provider as well.   To learn more about what you can do with MyChart, go to [email or web address].    Your next appointment:   6 month(s)  The format for your next appointment:   In Person  Provider:   You may see Peter Jordan, MD or one of the following Advanced Practice Providers on your designated Care Team:    Hao Meng, PA-C  Angela Duke, PA-C or   Krista Kroeger, PA-C      

## 2021-07-07 ENCOUNTER — Telehealth: Payer: Self-pay | Admitting: Cardiology

## 2021-07-07 NOTE — Telephone Encounter (Addendum)
Nurse working remote today but confirmed Xarelto 20 mg samples available for pick.  Wife made aware and verbalized understanding.

## 2021-07-07 NOTE — Telephone Encounter (Signed)
Patient calling the office for samples of medication:   1.  What medication and dosage are you requesting samples for? rivaroxaban (XARELTO) 20 MG TABS tablet  2.  Are you currently out of this medication? No

## 2021-09-24 ENCOUNTER — Other Ambulatory Visit: Payer: Self-pay | Admitting: Cardiology

## 2021-12-11 NOTE — Progress Notes (Signed)
[name] Date of Birth: Sep 04, 1933   History of Present Illness: [name] is seen today for followup AFib. He has a history of an anterior myocardial infarction in November 2009. This was treated with a drug-eluting stent to the mid LAD.  He had a stress Myoview study November 2012. He is able to walk for 8 minutes. He had no clinical symptoms. Images showed a fixed anterior septal and apical defect. There was no ischemia. Ejection fraction was 44%.   In November 2014 he was found to be in atrial fibrillation with a controlled response. He was started on Xarelto. Echo showed EF 40-45% with moderate biatrial enlargement.  On follow up today he is doing very well.  He has no chest pain, SOB, palpitations. No bleeding. Continues to walk 4-6 miles/day. He denies any dizziness. Energy level is very good. He has lost about 5 lbs. Has had some minor surgery for cataracts and skin cancer.    Current Outpatient Medications on File Prior to Visit  Medication Sig Dispense Refill   calcium carbonate (OS-CAL) 600 MG TABS Take 600 mg by mouth 2 (two) times daily with a meal.     Cholecalciferol (VITAMIN D3) 2000 UNITS TABS Take 2,000 Int'l Units by mouth daily.     No current facility-administered medications on file prior to visit.    No Known Allergies  Past Medical History:  Diagnosis Date   Atrial fibrillation [facility])    Coronary artery disease    Nov 2009-MI-stenting of the mid LAD drug-eluding stent   Hx of radiation therapy 05/15/14- 07/11/14   prostate 7800 cGy 40 sessions, seminal vesicles 5600 cGy 40 sessions   Hyperlipidemia    Hypertension    LV dysfunction    MI (myocardial infarction) ([place]) 1998   Osteopenia    Prostate CA ([place]) 05/05/2009   prostate cancer 11/09-tx with cryotherapy, Dr Gaynelle Arabian    Past Surgical History:  Procedure Laterality Date   CARDIAC CATHETERIZATION     Nov 2009-drug eluding stent to mid LAD   HERNIA REPAIR     inguinal   PROSTATE BIOPSY   09/25/2008   gleason 4+3=7   PROSTATE CRYOABLATION  05/05/2009   Dr Gaynelle Arabian    Social History   Tobacco Use  Smoking Status Never  Smokeless Tobacco Never    Social History   Substance and Sexual Activity  Alcohol Use No    Family History  Problem Relation Age of Onset   Heart disease Mother    Heart failure Mother    Heart disease Brother    Hematuria Father    Cancer Father        prostate   Heart disease Sister     Review of Systems: As noted in history of present illness.  All other systems were reviewed and are negative.  Physical Exam: BP 112/70    Pulse 71    Ht 5\' 10"  (1.778 m)    Wt 167 lb 3.2 oz (75.8 kg)    SpO2 97%    BMI 23.99 kg/m  GENERAL:  Well appearing elderly WM in NAD HEENT:  PERRL, EOMI, sclera are clear. Oropharynx is clear. NECK:  No jugular venous distention, carotid upstroke brisk and symmetric, no bruits, no thyromegaly or adenopathy LUNGS:  Clear to auscultation bilaterally CHEST:  Unremarkable HEART:  IRRR,  PMI not displaced or sustained,S1 and S2 within normal limits, no S3, no S4: no clicks, no rubs, no murmurs ABD:  Soft, nontender. BS +, no  masses or bruits. No hepatomegaly, no splenomegaly EXT:  2 + pulses throughout, no edema, no cyanosis no clubbing SKIN:  Warm and dry.  No rashes NEURO:  Alert and oriented x 3. Cranial nerves II through XII intact. PSYCH:  Cognitively intact   LABORATORY DATA: Lab Results  Component Value Date   WBC 5.4 08/29/2018   HGB 14.2 08/29/2018   HCT 42.9 08/29/2018   PLT 170 08/29/2018   GLUCOSE 92 08/29/2018   CHOL 137 08/29/2018   TRIG 57 08/29/2018   HDL 74 08/29/2018   LDLCALC 52 08/29/2018   ALT 13 08/29/2018   AST 24 08/29/2018   NA 142 08/29/2018   K 5.0 08/29/2018   CL 105 08/29/2018   CREATININE 0.96 08/29/2018   BUN 14 08/29/2018   CO2 26 08/29/2018   TSH 2.02 11/14/2013   INR 1.1 11/01/2008   Labs dated 10/04/19: cholesterol 145, triglycerides 75, HDL 68, LDL 61. CBC and  chemistries normal Dated 10/21/20: cholesterol 141, triglycerides 64, HDL 71, LDL 56. CBC normal Dated 11/17/20: potassium 5.4 otherwise CMET normal. Dated 11/03/21: cholesterol 139, triglycerides 64, HDL 71, LDL 55. CMET and CBC normal.     Assessment / Plan: 1. Coronary disease with remote anterior myocardial infarction 2009 treated with drug-eluting stent to the LAD.  Myoview study 11/12 showed no ischemia. He is asymptomatic. Will continue medical therapy.  2. Hypertension, well controlled. Avoid ACEi/ARB due to history of elevated potassium.  3. Hypercholesterolemia, on Zocor. Excellent control. At goal  LDL 55  4. Left ventricular dysfunction. Ejection fraction 40-45% by Echo 2014. Unchanged from 2009. Well compensated and asymptomatic.   5. Atrial fibrillation. Rate is controlled on low-dose metoprolol. Patient is  asymptomatic. [place] score is 3. Continue Xarelto 20 mg daily.  I will follow up in 6 months

## 2021-12-15 ENCOUNTER — Ambulatory Visit (INDEPENDENT_AMBULATORY_CARE_PROVIDER_SITE_OTHER): Payer: Medicare Other | Admitting: Cardiology

## 2021-12-15 ENCOUNTER — Other Ambulatory Visit: Payer: Self-pay

## 2021-12-15 ENCOUNTER — Encounter: Payer: Self-pay | Admitting: Cardiology

## 2021-12-15 VITALS — BP 112/70 | HR 71 | Ht 70.0 in | Wt 167.2 lb

## 2021-12-15 DIAGNOSIS — Z9861 Coronary angioplasty status: Secondary | ICD-10-CM

## 2021-12-15 DIAGNOSIS — I10 Essential (primary) hypertension: Secondary | ICD-10-CM

## 2021-12-15 DIAGNOSIS — I482 Chronic atrial fibrillation, unspecified: Secondary | ICD-10-CM | POA: Diagnosis not present

## 2021-12-15 DIAGNOSIS — I2510 Atherosclerotic heart disease of native coronary artery without angina pectoris: Secondary | ICD-10-CM

## 2021-12-15 DIAGNOSIS — E785 Hyperlipidemia, unspecified: Secondary | ICD-10-CM | POA: Diagnosis not present

## 2021-12-15 MED ORDER — RIVAROXABAN 20 MG PO TABS: ORAL_TABLET | ORAL | 0 refills | Status: DC

## 2021-12-15 MED ORDER — RIVAROXABAN 20 MG PO TABS: ORAL_TABLET | ORAL | 3 refills | Status: DC

## 2021-12-15 MED ORDER — SIMVASTATIN 40 MG PO TABS: 40.0000 mg | ORAL_TABLET | Freq: Every day | ORAL | 3 refills | Status: DC

## 2021-12-15 MED ORDER — AMLODIPINE BESYLATE 10 MG PO TABS: 10.0000 mg | ORAL_TABLET | Freq: Every day | ORAL | 3 refills | Status: DC

## 2021-12-15 MED ORDER — METOPROLOL TARTRATE 50 MG PO TABS: 25.0000 mg | ORAL_TABLET | Freq: Two times a day (BID) | ORAL | 3 refills | Status: DC

## 2021-12-15 NOTE — Patient Instructions (Signed)
Medication Instructions:  No changes   *If you need a refill on your cardiac medications before your next appointment, please call your pharmacy*   Lab Work:  Not needed   Testing/Procedures:  Not needed  Follow-Up: At [facility], you and your health needs are our priority.  As part of our continuing mission to provide you with exceptional heart care, we have created designated Provider Care Teams.  These Care Teams include your primary Cardiologist (physician) and Advanced Practice Providers (APPs -  Physician Assistants and Nurse Practitioners) who all work together to provide you with the care you need, when you need it.     Your next appointment:   6 month(s)  The format for your next appointment:   In Person  Provider:   None

## 2021-12-15 NOTE — Addendum Note (Signed)
Addended by: Raiford Simmonds on: 12/15/2021 09:43 AM   Modules accepted: Orders

## 2021-12-29 ENCOUNTER — Ambulatory Visit: Payer: Medicare Other | Admitting: Cardiology

## 2022-05-26 NOTE — Progress Notes (Signed)
[name] Date of Birth: April 07, 1933   History of Present Illness: [name] is seen today for followup AFib. He has a history of an anterior myocardial infarction in November 2009. This was treated with a drug-eluting stent to the mid LAD.  He had a stress Myoview study November 2012. He is able to walk for 8 minutes. He had no clinical symptoms. Images showed a fixed anterior septal and apical defect. There was no ischemia. Ejection fraction was 44%.   In November 2014 he was found to be in atrial fibrillation with a controlled response. He was started on Xarelto. Echo showed EF 40-45% with moderate biatrial enlargement.  On follow up today he is doing very well.  He has no chest pain, SOB, palpitations. No bleeding. Continues to walk 4-6 miles/day. He denies any dizziness. Energy level is very good. He  does have chronic low back  pain.    Current Outpatient Medications on File Prior to Visit  Medication Sig Dispense Refill   amLODipine (NORVASC) 10 MG tablet Take 1 tablet (10 mg total) by mouth daily. 90 tablet 3   calcium carbonate (OS-CAL) 600 MG TABS Take 600 mg by mouth 2 (two) times daily with a meal.     Cholecalciferol (VITAMIN D3) 2000 UNITS TABS Take 2,000 Int'l Units by mouth daily.     metoprolol tartrate (LOPRESSOR) 50 MG tablet Take 0.5 tablets (25 mg total) by mouth 2 (two) times daily. 90 tablet 3   rivaroxaban (XARELTO) 20 MG TABS tablet TAKE 1 TABLET BY MOUTH DAILY WITH SUPPER 21 tablet 0   simvastatin (ZOCOR) 40 MG tablet Take 1 tablet (40 mg total) by mouth at bedtime. 90 tablet 3   No current facility-administered medications on file prior to visit.    No Known Allergies  Past Medical History:  Diagnosis Date   Atrial fibrillation [facility])    Coronary artery disease    Nov 2009-MI-stenting of the mid LAD drug-eluding stent   Hx of radiation therapy 05/15/14- 07/11/14   prostate 7800 cGy 40 sessions, seminal vesicles 5600 cGy 40 sessions   Hyperlipidemia     Hypertension    LV dysfunction    MI (myocardial infarction) ([place]) 1998   Osteopenia    Prostate CA ([place]) 05/05/2009   prostate cancer 11/09-tx with cryotherapy, Dr Gaynelle Arabian    Past Surgical History:  Procedure Laterality Date   CARDIAC CATHETERIZATION     Nov 2009-drug eluding stent to mid LAD   HERNIA REPAIR     inguinal   PROSTATE BIOPSY  09/25/2008   gleason 4+3=7   PROSTATE CRYOABLATION  05/05/2009   Dr Gaynelle Arabian    Social History   Tobacco Use  Smoking Status Never  Smokeless Tobacco Never    Social History   Substance and Sexual Activity  Alcohol Use No    Family History  Problem Relation Age of Onset   Heart disease Mother    Heart failure Mother    Heart disease Brother    Hematuria Father    Cancer Father        prostate   Heart disease Sister     Review of Systems: As noted in history of present illness.  All other systems were reviewed and are negative.  Physical Exam: BP 105/60   Pulse (!) 54   Ht 5' 8.05" (1.728 m)   Wt 163 lb 9.6 oz (74.2 kg)   SpO2 94%   BMI 24.84 kg/m  GENERAL:  Well appearing elderly  WM in NAD HEENT:  PERRL, EOMI, sclera are clear. Oropharynx is clear. NECK:  No jugular venous distention, carotid upstroke brisk and symmetric, no bruits, no thyromegaly or adenopathy LUNGS:  Clear to auscultation bilaterally CHEST:  Unremarkable HEART:  IRRR,  PMI not displaced or sustained,S1 and S2 within normal limits, no S3, no S4: no clicks, no rubs, no murmurs ABD:  Soft, nontender. BS +, no masses or bruits. No hepatomegaly, no splenomegaly EXT:  2 + pulses throughout, no edema, no cyanosis no clubbing SKIN:  Warm and dry.  No rashes NEURO:  Alert and oriented x 3. Cranial nerves II through XII intact. PSYCH:  Cognitively intact   LABORATORY DATA: Lab Results  Component Value Date   WBC 5.4 08/29/2018   HGB 14.2 08/29/2018   HCT 42.9 08/29/2018   PLT 170 08/29/2018   GLUCOSE 92 08/29/2018   CHOL 137 08/29/2018   TRIG  57 08/29/2018   HDL 74 08/29/2018   LDLCALC 52 08/29/2018   ALT 13 08/29/2018   AST 24 08/29/2018   NA 142 08/29/2018   K 5.0 08/29/2018   CL 105 08/29/2018   CREATININE 0.96 08/29/2018   BUN 14 08/29/2018   CO2 26 08/29/2018   TSH 2.02 11/14/2013   INR 1.1 11/01/2008   Labs dated 10/04/19: cholesterol 145, triglycerides 75, HDL 68, LDL 61. CBC and chemistries normal Dated 10/21/20: cholesterol 141, triglycerides 64, HDL 71, LDL 56. CBC normal Dated 11/17/20: potassium 5.4 otherwise CMET normal. Dated 11/03/21: cholesterol 139, triglycerides 64, HDL 71, LDL 55. CMET and CBC normal.   Ecg today shows Afib with rate 54. LAD. No acute change. I have personally reviewed and interpreted this study.   Assessment / Plan: 1. Coronary disease with remote anterior myocardial infarction 2009 treated with drug-eluting stent to the LAD.  Myoview study 11/12 showed no ischemia. He is asymptomatic. Will continue medical therapy.  2. Hypertension, well controlled. Avoid ACEi/ARB due to history of elevated potassium.  3. Hypercholesterolemia, on Zocor. Excellent control. At goal  LDL 55  4. Left ventricular dysfunction. Ejection fraction 40-45% by Echo 2014. Unchanged from 2009. Well compensated and asymptomatic.   5. Atrial fibrillation. Rate is controlled on low-dose metoprolol. Patient is  asymptomatic. [place] score is 3. Continue Xarelto 20 mg daily.  I will follow up in 6 months

## 2022-05-31 ENCOUNTER — Encounter: Payer: Self-pay | Admitting: Cardiology

## 2022-05-31 ENCOUNTER — Ambulatory Visit (INDEPENDENT_AMBULATORY_CARE_PROVIDER_SITE_OTHER): Payer: Medicare Other | Admitting: Cardiology

## 2022-05-31 VITALS — BP 105/60 | HR 54 | Ht 68.05 in | Wt 163.6 lb

## 2022-05-31 DIAGNOSIS — I2510 Atherosclerotic heart disease of native coronary artery without angina pectoris: Secondary | ICD-10-CM | POA: Diagnosis not present

## 2022-05-31 DIAGNOSIS — I10 Essential (primary) hypertension: Secondary | ICD-10-CM | POA: Diagnosis not present

## 2022-05-31 DIAGNOSIS — I482 Chronic atrial fibrillation, unspecified: Secondary | ICD-10-CM | POA: Diagnosis not present

## 2022-05-31 DIAGNOSIS — Z9861 Coronary angioplasty status: Secondary | ICD-10-CM

## 2022-05-31 DIAGNOSIS — E785 Hyperlipidemia, unspecified: Secondary | ICD-10-CM | POA: Diagnosis not present

## 2022-05-31 NOTE — Patient Instructions (Signed)

## 2022-11-30 NOTE — Progress Notes (Unsigned)
[name] Date of Birth: July 24, 1933   History of Present Illness: [name] is seen today for followup AFib. He has a history of an anterior myocardial infarction in November 2009. This was treated with a drug-eluting stent to the mid LAD.  He had a stress Myoview study November 2012. He is able to walk for 8 minutes. He had no clinical symptoms. Images showed a fixed anterior septal and apical defect. There was no ischemia. Ejection fraction was 44%.   In November 2014 he was found to be in atrial fibrillation with a controlled response. He was started on Xarelto. Echo showed EF 40-45% with moderate biatrial enlargement.  On follow up today he is doing very well.  He states he has had a wonderful summer. Walking 3 miles twice a day. No dyspnea, chest pain, palpitations or increased edema.  BP has been well controlled at home.   Current Outpatient Medications on File Prior to Visit  Medication Sig Dispense Refill   amLODipine (NORVASC) 10 MG tablet Take 1 tablet (10 mg total) by mouth daily. 90 tablet 3   calcium carbonate (OS-CAL) 600 MG TABS Take 600 mg by mouth 2 (two) times daily with a meal.     Cholecalciferol (VITAMIN D3) 2000 UNITS TABS Take 2,000 Int'l Units by mouth daily.     metoprolol tartrate (LOPRESSOR) 50 MG tablet Take 0.5 tablets (25 mg total) by mouth 2 (two) times daily. 90 tablet 3   rivaroxaban (XARELTO) 20 MG TABS tablet TAKE 1 TABLET BY MOUTH DAILY WITH SUPPER 21 tablet 0   simvastatin (ZOCOR) 40 MG tablet Take 1 tablet (40 mg total) by mouth at bedtime. 90 tablet 3   No current facility-administered medications on file prior to visit.    No Known Allergies  Past Medical History:  Diagnosis Date   Atrial fibrillation [facility])    Coronary artery disease    Nov 2009-MI-stenting of the mid LAD drug-eluding stent   Hx of radiation therapy 05/15/14- 07/11/14   prostate 7800 cGy 40 sessions, seminal vesicles 5600 cGy 40 sessions   Hyperlipidemia    Hypertension     LV dysfunction    MI (myocardial infarction) ([place]) 1998   Osteopenia    Prostate CA ([place]) 05/05/2009   prostate cancer 11/09-tx with cryotherapy, Dr Gaynelle Arabian    Past Surgical History:  Procedure Laterality Date   CARDIAC CATHETERIZATION     Nov 2009-drug eluding stent to mid LAD   HERNIA REPAIR     inguinal   PROSTATE BIOPSY  09/25/2008   gleason 4+3=7   PROSTATE CRYOABLATION  05/05/2009   Dr Gaynelle Arabian    Social History   Tobacco Use  Smoking Status Never  Smokeless Tobacco Never    Social History   Substance and Sexual Activity  Alcohol Use No    Family History  Problem Relation Age of Onset   Heart disease Mother    Heart failure Mother    Heart disease Brother    Hematuria Father    Cancer Father        prostate   Heart disease Sister     Review of Systems: As noted in history of present illness.  All other systems were reviewed and are negative.  Physical Exam: BP 136/80 (BP Location: Right Arm, Cuff Size: Normal)   Pulse 88   Ht '5\' 8"'$  (1.727 m)   Wt 163 lb 9.6 oz (74.2 kg)   SpO2 98%   BMI 24.88 kg/m  GENERAL:  Well appearing elderly WM in NAD HEENT:  PERRL, EOMI, sclera are clear. Oropharynx is clear. NECK:  No jugular venous distention, carotid upstroke brisk and symmetric, no bruits, no thyromegaly or adenopathy LUNGS:  Clear to auscultation bilaterally CHEST:  Unremarkable HEART:  IRRR,  PMI not displaced or sustained,S1 and S2 within normal limits, no S3, no S4: no clicks, no rubs, no murmurs ABD:  Soft, nontender. BS +, no masses or bruits. No hepatomegaly, no splenomegaly EXT:  2 + pulses throughout, no edema, no cyanosis no clubbing SKIN:  Warm and dry.  No rashes NEURO:  Alert and oriented x 3. Cranial nerves II through XII intact. PSYCH:  Cognitively intact   LABORATORY DATA: Lab Results  Component Value Date   WBC 5.4 08/29/2018   HGB 14.2 08/29/2018   HCT 42.9 08/29/2018   PLT 170 08/29/2018   GLUCOSE 92 08/29/2018   CHOL 137  08/29/2018   TRIG 57 08/29/2018   HDL 74 08/29/2018   LDLCALC 52 08/29/2018   ALT 13 08/29/2018   AST 24 08/29/2018   NA 142 08/29/2018   K 5.0 08/29/2018   CL 105 08/29/2018   CREATININE 0.96 08/29/2018   BUN 14 08/29/2018   CO2 26 08/29/2018   TSH 2.02 11/14/2013   INR 1.1 11/01/2008   Labs dated 10/04/19: cholesterol 145, triglycerides 75, HDL 68, LDL 61. CBC and chemistries normal Dated 10/21/20: cholesterol 141, triglycerides 64, HDL 71, LDL 56. CBC normal Dated 11/17/20: potassium 5.4 otherwise CMET normal. Dated 11/03/21: cholesterol 139, triglycerides 64, HDL 71, LDL 55. CMET and CBC normal. Dated 11/10/22: cholesterol 145, triglycerides 63, HDL 71, LDL 61. CBC and CMET normal   Ecg not done today .   Assessment / Plan: 1. Coronary disease with remote anterior myocardial infarction 2009 treated with drug-eluting stent to the LAD.  Myoview study 11/12 showed no ischemia. He is asymptomatic. Will continue medical therapy.  2. Hypertension, well controlled. Avoid ACEi/ARB due to history of elevated potassium.  3. Hypercholesterolemia, on Zocor. Excellent control. At goal  LDL 61  4. Left ventricular dysfunction. Ejection fraction 40-45% by Echo 2014. Unchanged from 2009. Well compensated and asymptomatic.   5. Atrial fibrillation. Rate is controlled on low-dose metoprolol. Patient is  asymptomatic. [place] score is 3. Continue Xarelto 20 mg daily.  I will follow up in 6 months

## 2022-12-02 ENCOUNTER — Encounter: Payer: Self-pay | Admitting: Cardiology

## 2022-12-02 ENCOUNTER — Ambulatory Visit: Payer: Medicare Other | Attending: Cardiology | Admitting: Cardiology

## 2022-12-02 VITALS — BP 136/80 | HR 88 | Ht 68.0 in | Wt 163.6 lb

## 2022-12-02 DIAGNOSIS — Z9861 Coronary angioplasty status: Secondary | ICD-10-CM | POA: Diagnosis not present

## 2022-12-02 DIAGNOSIS — I10 Essential (primary) hypertension: Secondary | ICD-10-CM | POA: Diagnosis not present

## 2022-12-02 DIAGNOSIS — E785 Hyperlipidemia, unspecified: Secondary | ICD-10-CM | POA: Insufficient documentation

## 2022-12-02 DIAGNOSIS — I2510 Atherosclerotic heart disease of native coronary artery without angina pectoris: Secondary | ICD-10-CM | POA: Insufficient documentation

## 2022-12-02 DIAGNOSIS — I482 Chronic atrial fibrillation, unspecified: Secondary | ICD-10-CM | POA: Diagnosis not present

## 2023-01-06 ENCOUNTER — Other Ambulatory Visit: Payer: Self-pay | Admitting: Cardiology

## 2023-02-10 ENCOUNTER — Other Ambulatory Visit: Payer: Self-pay

## 2023-02-10 ENCOUNTER — Telehealth: Payer: Self-pay

## 2023-02-10 DIAGNOSIS — I482 Chronic atrial fibrillation, unspecified: Secondary | ICD-10-CM

## 2023-02-10 MED ORDER — RIVAROXABAN 20 MG PO TABS: ORAL_TABLET | ORAL | 1 refills | Status: DC

## 2023-02-10 NOTE — Telephone Encounter (Signed)
Prescription refill request for Xarelto received.  Indication:  Last office visit: 12/02/22 ([place])  Weight: 74.2kg Age: 89 Scr: 0.84 (11/10/22 via [facility])  CrCl: 65.26m/min  Appropriate dose. Refill sent.

## 2023-02-10 NOTE — Telephone Encounter (Signed)
While speaking with patients wife, she mentioned her husband has been having some back pain. She states Dr.Jordan told him not to take any ibuprofen due to him being on a blood thinner. She says he was given an anti inflammatory for this issue before. She would like some advice regarding this, let her know that provider is out of office today but would send message to nurse.

## 2023-02-14 NOTE — Telephone Encounter (Signed)
[name], Miguel M, MD  You    No there are not any that I would consider "safe". Should take tylenol.  Miguel Martinique MD, [place] pt's wife back with Dr. Doug Sou recommendations. Advised that topical treatments are also recommended. Wife verbalizes understanding.

## 2023-02-14 NOTE — Telephone Encounter (Signed)
Pt calling back for an update

## 2023-02-14 NOTE — Telephone Encounter (Signed)
Patient's wife is returning call. 

## 2023-02-14 NOTE — Telephone Encounter (Signed)
Spoke with pt's wife, [name] (ok per [facility]) regarding pt's issues with back pain. Wife states that on occasion pt has back pain and she would like to know if there are any anti-inflammatory medication that pt can take for these. Advised wife that pt can take tylenol for back pain, however tylenol is not considered an anti- inflammatory. Wife would like to know if there is any anti-inflammatory medications that are safe to use with blood thinners. Will send to provider to advise. Wife verbalizes understanding.

## 2023-02-14 NOTE — Telephone Encounter (Signed)
Left message for pts wife to call back. 

## 2023-03-15 ENCOUNTER — Other Ambulatory Visit: Payer: Self-pay | Admitting: Orthopedic Surgery

## 2023-03-15 DIAGNOSIS — M545 Low back pain, unspecified: Secondary | ICD-10-CM

## 2023-03-16 ENCOUNTER — Ambulatory Visit
Admission: RE | Admit: 2023-03-16 | Discharge: 2023-03-16 | Disposition: A | Discharge status: Home / Self Care | Payer: Medicare Other | Source: Ambulatory Visit | Attending: Orthopedic Surgery | Admitting: Orthopedic Surgery

## 2023-03-16 DIAGNOSIS — M545 Low back pain, unspecified: Secondary | ICD-10-CM

## 2023-04-18 ENCOUNTER — Telehealth: Payer: Self-pay | Admitting: Cardiology

## 2023-04-18 NOTE — Telephone Encounter (Signed)
Patient's wife is calling with questions due to the patient having spine issues and wanting to discuss what he can and can't do with his heart condition. Please advise.

## 2023-04-18 NOTE — Telephone Encounter (Signed)
Patient wife called regarding spinal issues.  Was told no Ibuprofen but take Tylenol. He is also on a muscle relaxer, methocarbamol 500 mg 1-2 tablets every 6-8 hours PRN.   Since  he has compression fractures "up and down his spine" hey wanted to do surgery but afraid due to his heart to put him completely under.  Dr Yevette Edwards Orthopedic surgery states possible surgeries, but concerns due to cardiac history.  Did not want to put him all the way under due to heart condition. Advised that generally the Ortho doctor will decide the Plan of care and once decided, if surgery or anesthesia is indicated, they will then reach out to Cardiology for clearance. Since they have not had a follow up with the ortho surgeon, there have not been plans made, so we have not been requested to give recommendations.  She does state that the MRI and OV notes were sent to her PCP.  Advised I would ask their office to send to [place] as well, so we can see their plan and if any for future surgery.

## 2023-04-19 NOTE — Telephone Encounter (Signed)
Spoke to patient's wife.She stated daughter is  scheduling appointment with neuro surgeon sooner.Stated husband is having severe pain in back.Stated muscle relaxer is not helping.Stated she will call back for a sooner appointment with Dr.Jordan when she finds out neuro appt.

## 2023-04-20 ENCOUNTER — Other Ambulatory Visit (HOSPITAL_COMMUNITY): Payer: Self-pay | Admitting: Interventional Radiology

## 2023-04-20 DIAGNOSIS — S32030A Wedge compression fracture of third lumbar vertebra, initial encounter for closed fracture: Secondary | ICD-10-CM

## 2023-04-20 DIAGNOSIS — S32020A Wedge compression fracture of second lumbar vertebra, initial encounter for closed fracture: Secondary | ICD-10-CM

## 2023-04-21 ENCOUNTER — Telehealth: Payer: Self-pay | Admitting: *Deleted

## 2023-04-21 ENCOUNTER — Telehealth (HOSPITAL_COMMUNITY): Payer: Self-pay

## 2023-04-21 NOTE — Telephone Encounter (Signed)
Patient is calling back asking for you to give her a call. Please advise

## 2023-04-21 NOTE — Telephone Encounter (Signed)
   Pre-operative Risk Assessment    Patient Name: [name]  DOB: 1933/09/22 MRN: [id number]      Request for Surgical Clearance    Procedure:   L2 & L3 KYPHOPLASTY  Date of Surgery:  Clearance 04/28/23                                 Surgeon:  DR. SANJEEV DEVESHWAR Surgeon's Group or Practice Name:  [facility] number:  [phone] Fax number:  [phone]   Type of Clearance Requested:   - Pharmacy:  Hold Rivaroxaban (Xarelto) X'S 2 DOSES PER REQUEST   Type of Anesthesia:   MODERATE SEDATION   Additional requests/questions:    Signed, Elliot Cousin   04/21/2023, 1:00 PM

## 2023-04-21 NOTE — Telephone Encounter (Signed)
   Patient Name: [name]  DOB: 01/31/1933 MRN: [id number]  Primary Cardiologist: None  Clinical pharmacists have reviewed the patient's past medical history, labs, and current medications as part of preoperative protocol coverage. The following recommendations have been made:  Patient with diagnosis of afib on Xarelto for anticoagulation.     Procedure: L2 + L3 kyphoplasty Date of procedure: 04/28/23   CHA2DS2-VASc Score = 5  This indicates a 7.2% annual risk of stroke. The patient's score is based upon: CHF History: 1 HTN History: 1 Diabetes History: 0 Stroke History: 0 Vascular Disease History: 1 Age Score: 2 Gender Score: 0   CrCl 55mL/min Platelet count 146K   Per office protocol, patient can hold Xarelto for 3 days prior to procedure.  Please resume Xarelto as soon as possible postprocedure, at the discretion of the surgeon.   I will route this recommendation to the requesting party via Epic fax function and remove from pre-op pool.  Please call with questions.  Joylene Grapes, NP 04/21/2023, 4:35 PM

## 2023-04-21 NOTE — Telephone Encounter (Signed)
Ok per Dr. Corliss Skains for L2/L3 KP. AB

## 2023-04-21 NOTE — Telephone Encounter (Signed)
Spoke to patient's wife she stated procedure kyphoplasty scheduled 5/2 with Dr.Sanjeev Deveshwar.  Clearance was received today and PharmD advised ok to hold Xarelto 3 days prior to procedure. Wife wanted to know if patient needed to see Dr.Jordan before procedure.I will send message to him for advice.

## 2023-04-21 NOTE — Telephone Encounter (Signed)
Patient with diagnosis of afib on Xarelto for anticoagulation.    Procedure: L2 + L3 kyphoplasty Date of procedure: 04/28/23  CHA2DS2-VASc Score = 5  This indicates a 7.2% annual risk of stroke. The patient's score is based upon: CHF History: 1 HTN History: 1 Diabetes History: 0 Stroke History: 0 Vascular Disease History: 1 Age Score: 2 Gender Score: 0   CrCl 35mL/min Platelet count 146K  Per office protocol, patient can hold Xarelto for 3 days prior to procedure.    **This guidance is not considered finalized until pre-operative APP has relayed final recommendations.**

## 2023-04-22 NOTE — Telephone Encounter (Signed)
Spoke to patient's wife Dr.Jordan's advice given. I hope procedure gives him relief.

## 2023-04-27 ENCOUNTER — Other Ambulatory Visit: Payer: Self-pay | Admitting: Radiology

## 2023-04-27 DIAGNOSIS — M549 Dorsalgia, unspecified: Secondary | ICD-10-CM

## 2023-04-28 ENCOUNTER — Other Ambulatory Visit (HOSPITAL_COMMUNITY): Payer: Self-pay | Admitting: Interventional Radiology

## 2023-04-28 ENCOUNTER — Encounter (HOSPITAL_COMMUNITY): Payer: Self-pay

## 2023-04-28 ENCOUNTER — Ambulatory Visit (HOSPITAL_COMMUNITY)
Admission: RE | Admit: 2023-04-28 | Discharge: 2023-04-28 | Disposition: A | Discharge status: Home / Self Care | Payer: Medicare Other | Source: Ambulatory Visit | Attending: Interventional Radiology | Admitting: Interventional Radiology

## 2023-04-28 ENCOUNTER — Other Ambulatory Visit: Payer: Self-pay

## 2023-04-28 DIAGNOSIS — S32030A Wedge compression fracture of third lumbar vertebra, initial encounter for closed fracture: Secondary | ICD-10-CM | POA: Insufficient documentation

## 2023-04-28 DIAGNOSIS — S32020A Wedge compression fracture of second lumbar vertebra, initial encounter for closed fracture: Secondary | ICD-10-CM | POA: Diagnosis present

## 2023-04-28 DIAGNOSIS — X58XXXA Exposure to other specified factors, initial encounter: Secondary | ICD-10-CM | POA: Diagnosis not present

## 2023-04-28 DIAGNOSIS — M549 Dorsalgia, unspecified: Secondary | ICD-10-CM

## 2023-04-28 DIAGNOSIS — M545 Low back pain, unspecified: Secondary | ICD-10-CM | POA: Diagnosis not present

## 2023-04-28 HISTORY — PX: IR KYPHO LUMBAR INC FX REDUCE BONE BX UNI/BIL CANNULATION INC/IMAGING: IMG5519

## 2023-04-28 HISTORY — PX: IR KYPHO EA ADDL LEVEL THORACIC OR LUMBAR: IMG5520

## 2023-04-28 LAB — BASIC METABOLIC PANEL
Anion gap: 10 (ref 5–15)
BUN: 11 mg/dL (ref 8–23)
CO2: 26 mmol/L (ref 22–32)
Calcium: 10.2 mg/dL (ref 8.9–10.3)
Chloride: 103 mmol/L (ref 98–111)
Creatinine, Ser: 0.88 mg/dL (ref 0.61–1.24)
GFR, Estimated: >60 mL/min (ref >60)
Glucose, Bld: 91 mg/dL (ref 70–99)
Potassium: 3.8 mmol/L (ref 3.5–5.1)
Sodium: 139 mmol/L (ref 135–145)

## 2023-04-28 LAB — CBC
HCT: 46.4 % (ref 39.0–52.0)
Hemoglobin: 15.2 g/dL (ref 13.0–17.0)
MCH: 32.3 pg (ref 26.0–34.0)
MCHC: 32.8 g/dL (ref 30.0–36.0)
MCV: 98.5 fL (ref 80.0–100.0)
Platelets: 251 10*3/uL (ref 150–400)
RBC: 4.71 MIL/uL (ref 4.22–5.81)
RDW: 13.2 % (ref 11.5–15.5)
WBC: 8.8 10*3/uL (ref 4.0–10.5)
nRBC: 0 % (ref 0.0–0.2)

## 2023-04-28 LAB — PROTIME-INR
INR: 1.1 (ref 0.8–1.2)
Prothrombin Time: 14 seconds (ref 11.4–15.2)

## 2023-04-28 MED ORDER — SODIUM CHLORIDE 0.9 % IV SOLN: INTRAVENOUS | Status: AC

## 2023-04-28 MED ORDER — LIDOCAINE HCL (PF) 1 % IJ SOLN
INTRAMUSCULAR | Status: AC
  Filled 2023-04-28: qty 30

## 2023-04-28 MED ORDER — CEFAZOLIN SODIUM-DEXTROSE 2-4 GM/100ML-% IV SOLN
2.0000 g | INTRAVENOUS | Status: AC
  Administered 2023-04-28: 2 g via INTRAVENOUS

## 2023-04-28 MED ORDER — BUPIVACAINE HCL (PF) 0.5 % IJ SOLN
INTRAMUSCULAR | Status: AC
  Filled 2023-04-28: qty 60

## 2023-04-28 MED ORDER — MIDAZOLAM HCL 2 MG/2ML IJ SOLN
INTRAMUSCULAR | Status: AC
  Filled 2023-04-28: qty 2

## 2023-04-28 MED ORDER — SODIUM CHLORIDE 0.9 % IV SOLN: INTRAVENOUS | Status: DC

## 2023-04-28 MED ORDER — TOBRAMYCIN SULFATE 1.2 G IJ SOLR
INTRAMUSCULAR | Status: AC
  Filled 2023-04-28: qty 1.2

## 2023-04-28 MED ORDER — FENTANYL CITRATE (PF) 100 MCG/2ML IJ SOLN
INTRAMUSCULAR | Status: AC
  Filled 2023-04-28: qty 2

## 2023-04-28 MED ORDER — FENTANYL CITRATE (PF) 100 MCG/2ML IJ SOLN
INTRAMUSCULAR | Status: AC | PRN
  Administered 2023-04-28 (×2): 25 ug via INTRAVENOUS

## 2023-04-28 MED ORDER — MIDAZOLAM HCL 2 MG/2ML IJ SOLN
INTRAMUSCULAR | Status: AC | PRN
  Administered 2023-04-28 (×2): 1 mg via INTRAVENOUS

## 2023-04-28 MED ORDER — CEFAZOLIN SODIUM-DEXTROSE 2-4 GM/100ML-% IV SOLN
INTRAVENOUS | Status: AC
  Filled 2023-04-28: qty 100

## 2023-04-28 NOTE — H&P (Signed)
Chief Complaint: Patient was seen in consultation today for Lumbar 2 and 3 Kyphoplasty at the request of Dr Karsten Ro  Supervising Physician: Julieanne Cotton  Patient Status: [facility] - Out-pt  History of Present Illness: [name] is a 89 y.o. male   Pt reached down in shower and had immediate back pain--- so bad he was unable to get out of shower on own Severe pain for weeks--- slightly less pain with meds Incident in Feb 2024 He has taken "muscle relaxer" for 3 months Pain continues to be 8/10 while up and moving  MR 03/16/23: MR THORACIC SPINE IMPRESSION: 1. Marrow replacing bone lesion within the T12 vertebral body without pathologic fracture. 2. Chronic moderate compression fractures at T2, T6, T7, T8, T9, and T11. 3. Multilevel thoracic spondylosis, most pronounced at the T8-T9 level where there is mild canal stenosis and mild-to-moderate bilateral foraminal stenosis. 4. Moderate to severe left-sided foraminal stenosis at T11-T12.   MR LUMBAR SPINE IMPRESSION: 1. Acute or subacute appearing moderate compression fracture of the L2 vertebral body with approximately 50% vertebral body height loss. Low signal throughout the vertebral body suggests a pathologic fracture through a marrow replacing bone lesion. 2. Mild superior endplate compression fracture of the L3 vertebral body with approximately 35% vertebral body height loss. Appearance of the marrow is also suggestive of a pathologic fracture secondary to a underlying bone lesion. 3. Mild superior endplate compression fracture at the L4 vertebral body on the left with approximately 30% vertebral body height loss. No evidence of an underlying bone lesion at this level. 4. Multilevel degenerative changes of the thoracic and lumbar spine as described above. Mild canal stenosis at L2-3 and L3-4.  Pt here today for L2/3 Kyphoplasty per Dr Yevette Edwards request Dr Corliss Skains has reviewed imaging and approves  procedure  LD Xarelto 3 days ago   Past Medical History:  Diagnosis Date   Atrial fibrillation [facility])    Coronary artery disease    Nov 2009-MI-stenting of the mid LAD drug-eluding stent   Hx of radiation therapy 05/15/14- 07/11/14   prostate 7800 cGy 40 sessions, seminal vesicles 5600 cGy 40 sessions   Hyperlipidemia    Hypertension    LV dysfunction    MI (myocardial infarction) (HCC) 1998   Osteopenia    Prostate CA (HCC) 05/05/2009   prostate cancer 11/09-tx with cryotherapy, Dr Patsi Sears    Past Surgical History:  Procedure Laterality Date   CARDIAC CATHETERIZATION     Nov 2009-drug eluding stent to mid LAD   HERNIA REPAIR     inguinal   PROSTATE BIOPSY  09/25/2008   gleason 4+3=7   PROSTATE CRYOABLATION  05/05/2009   Dr Patsi Sears    Allergies: Patient has no known allergies.  Medications: Prior to Admission medications   Medication Sig Start Date End Date Taking? Authorizing Provider  amLODipine (NORVASC) 10 MG tablet Take 1 tablet by mouth once daily 01/07/23  Yes [place], Peter M, MD  calcium carbonate (OS-CAL) 600 MG TABS Take 600 mg by mouth 2 (two) times daily with a meal.   Yes [provider]  Cholecalciferol (VITAMIN D3) 2000 UNITS TABS Take 2,000 Int'l Units by mouth daily.   Yes [provider]  metoprolol tartrate (LOPRESSOR) 50 MG tablet Take 1/2 (one-half) tablet by mouth twice daily 01/07/23  Yes [place], Peter M, MD  rivaroxaban (XARELTO) 20 MG TABS tablet TAKE 1 TABLET BY MOUTH DAILY WITH SUPPER 02/10/23  Yes [place], Peter M, MD  simvastatin (ZOCOR) 40  MG tablet TAKE 1 TABLET BY MOUTH AT BEDTIME 01/07/23  Yes [place], Peter M, MD     Family History  Problem Relation Age of Onset   Heart disease Mother    Heart failure Mother    Heart disease Brother    Hematuria Father    Cancer Father        prostate   Heart disease Sister     Social History   Socioeconomic History   Marital status: Married    Spouse name: Not on file    Number of children: 1   Years of education: Not on file   Highest education level: Not on file  Occupational History   Occupation: executive    Employer: RETIRED  Tobacco Use   Smoking status: Never   Smokeless tobacco: Never  Substance and Sexual Activity   Alcohol use: No   Drug use: No   Sexual activity: Not on file  Other Topics Concern   Not on file  Social History Narrative   Not on file   Social Determinants of Health   Financial Resource Strain: Not on file  Food Insecurity: Not on file  Transportation Needs: Not on file  Physical Activity: Not on file  Stress: Not on file  Social Connections: Not on file    Review of Systems: A 12 point ROS discussed and pertinent positives are indicated in the HPI above.  All other systems are negative.  Review of Systems  Constitutional:  Positive for activity change. Negative for appetite change, fatigue and fever.  Respiratory:  Negative for cough and shortness of breath.   Cardiovascular:  Negative for chest pain.  Gastrointestinal:  Negative for abdominal pain.  Musculoskeletal:  Positive for back pain and gait problem.  Neurological:  Negative for weakness.  Psychiatric/Behavioral:  Negative for behavioral problems and confusion.     Vital Signs: BP (!) 136/102   Pulse 98   Temp (!) 97.4 F (36.3 C) (Temporal)   Resp 17   Ht 5\' 8"  (1.727 m)   Wt 161 lb (73 kg)   SpO2 100%   BMI 24.48 kg/m   Physical Exam Vitals reviewed.  HENT:     Mouth/Throat:     Mouth: Mucous membranes are moist.  Cardiovascular:     Rate and Rhythm: Normal rate. Rhythm irregular.     Heart sounds: No murmur heard. Pulmonary:     Effort: Pulmonary effort is normal.     Breath sounds: Normal breath sounds.  Abdominal:     Palpations: Abdomen is soft.     Tenderness: There is no abdominal tenderness.  Musculoskeletal:        General: Normal range of motion.     Comments: Low back pain  Skin:    General: Skin is warm.   Neurological:     Mental Status: He is alert and oriented to person, place, and time.  Psychiatric:        Behavior: Behavior normal.     Imaging: No results found.  Labs:  CBC: Recent Labs    04/28/23 0656  WBC 8.8  HGB 15.2  HCT 46.4  PLT 251    COAGS: Recent Labs    04/28/23 0656  INR 1.1    BMP: Recent Labs    04/28/23 0656  NA 139  K 3.8  CL 103  CO2 26  GLUCOSE 91  BUN 11  CALCIUM 10.2  CREATININE 0.88  GFRNONAA >60    LIVER FUNCTION TESTS: No  results for input(s): "BILITOT", "AST", "ALT", "ALKPHOS", "PROT", "ALBUMIN" in the last 8760 hours.  TUMOR MARKERS: No results for input(s): "AFPTM", "CEA", "CA199", "CHROMGRNA" in the last 8760 hours.  Assessment and Plan:  Scheduled today for L2/3 Kyphoplasty and probable biopsy Risks and benefits of Lumbar 2 and 3 Kyphoplasty and Biopsy were discussed with the patient including, but not limited to education regarding the natural healing process of compression fractures without intervention, bleeding, infection, cement migration which may cause spinal cord damage, paralysis, pulmonary embolism or even death.  This interventional procedure involves the use of X-rays and because of the nature of the planned procedure, it is possible that we will have prolonged use of X-ray fluoroscopy.  Potential radiation risks to you include (but are not limited to) the following: - A slightly elevated risk for cancer  several years later in life. This risk is typically less than 0.5% percent. This risk is low in comparison to the normal incidence of human cancer, which is 33% for women and 50% for men according to the American Cancer Society. - Radiation induced injury can include skin redness, resembling a rash, tissue breakdown / ulcers and hair loss (which can be temporary or permanent).   The likelihood of either of these occurring depends on the difficulty of the procedure and whether you are sensitive to radiation  due to previous procedures, disease, or genetic conditions.   IF your procedure requires a prolonged use of radiation, you will be notified and given written instructions for further action.  It is your responsibility to monitor the irradiated area for the 2 weeks following the procedure and to notify your physician if you are concerned that you have suffered a radiation induced injury.    All of the patient's questions were answered, patient is agreeable to proceed.  Consent signed and in chart.  Thank you for this interesting consult.  I greatly enjoyed meeting LC JOYNT and look forward to participating in their care.  A copy of this report was sent to the requesting provider on this date.  Electronically Signed: Robet Leu, PA-C 04/28/2023, 7:28 AM   I spent a total of  30 Minutes   in face to face in clinical consultation, greater than 50% of which was counseling/coordinating care for L2/3/KP/Bx

## 2023-04-28 NOTE — Procedures (Signed)
INR.  Status post L2 and L3 balloon kyphoplasty for painful compression fractures.  Patient tolerated  the procedure well.  There were no acute complications.    Fatima Sanger MD

## 2023-04-28 NOTE — Discharge Instructions (Signed)
INR.  1.  No stooping, or bending or lifting weights above 10 pounds for 2 weeks.  2.  Use a walker to ambulate for 2 weeks.  3.  No driving for 2 weeks.  4.  Call referring MDs office PRN in 2 weeks.

## 2023-06-30 NOTE — Progress Notes (Signed)
[name] Date of Birth: January 03, 1933   History of Present Illness: [name] is seen today for followup AFib and CAD. He has a history of an anterior myocardial infarction in November 2009. This was treated with a drug-eluting stent to the mid LAD.  He had a stress Myoview study November 2012. He is able to walk for 8 minutes. He had no clinical symptoms. Images showed a fixed anterior septal and apical defect. There was no ischemia. Ejection fraction was 44%.   In November 2014 he was found to be in atrial fibrillation with a controlled response. He was started on Xarelto. Echo showed EF 40-45% with moderate biatrial enlargement. Treated with rate control and anticoagulation.   On follow up today he is seen with his son. He had a vertebral compression fracture just bending over in the shower and had a vertebroplasty. He is still having some pain and hasn't resumed normal activity yet. Has lost 11 lbs since Dec. Is using a Supplement now.  No dyspnea, chest pain, palpitations or edema.  BP has been well controlled at home.   Current Outpatient Medications on File Prior to Visit  Medication Sig Dispense Refill   amLODipine (NORVASC) 10 MG tablet Take 1 tablet by mouth once daily 90 tablet 3   calcium carbonate (OS-CAL) 600 MG TABS Take 600 mg by mouth 2 (two) times daily with a meal.     Cholecalciferol (VITAMIN D3) 2000 UNITS TABS Take 2,000 Int'l Units by mouth daily.     metoprolol tartrate (LOPRESSOR) 50 MG tablet Take 1/2 (one-half) tablet by mouth twice daily 90 tablet 3   rivaroxaban (XARELTO) 20 MG TABS tablet TAKE 1 TABLET BY MOUTH DAILY WITH SUPPER 90 tablet 1   simvastatin (ZOCOR) 40 MG tablet TAKE 1 TABLET BY MOUTH AT BEDTIME 90 tablet 3   No current facility-administered medications on file prior to visit.    No Known Allergies  Past Medical History:  Diagnosis Date   Atrial fibrillation [facility])    Coronary artery disease    Nov 2009-MI-stenting of the mid LAD drug-eluding  stent   Hx of radiation therapy 05/15/14- 07/11/14   prostate 7800 cGy 40 sessions, seminal vesicles 5600 cGy 40 sessions   Hyperlipidemia    Hypertension    LV dysfunction    MI (myocardial infarction) (HCC) 1998   Osteopenia    Prostate CA (HCC) 05/05/2009   prostate cancer 11/09-tx with cryotherapy, Dr Patsi Sears    Past Surgical History:  Procedure Laterality Date   CARDIAC CATHETERIZATION     Nov 2009-drug eluding stent to mid LAD   HERNIA REPAIR     inguinal   IR KYPHO EA ADDL LEVEL THORACIC OR LUMBAR  04/28/2023   IR KYPHO LUMBAR INC FX REDUCE BONE BX UNI/BIL CANNULATION INC/IMAGING  04/28/2023   PROSTATE BIOPSY  09/25/2008   gleason 4+3=7   PROSTATE CRYOABLATION  05/05/2009   Dr Patsi Sears    Social History   Tobacco Use  Smoking Status Never  Smokeless Tobacco Never    Social History   Substance and Sexual Activity  Alcohol Use No    Family History  Problem Relation Age of Onset   Heart disease Mother    Heart failure Mother    Heart disease Brother    Hematuria Father    Cancer Father        prostate   Heart disease Sister     Review of Systems: As noted in history of present illness.  All other systems were reviewed and are negative.  Physical Exam: BP 126/80 (BP Location: Right Arm, Patient Position: Sitting, Cuff Size: Normal)   Pulse 66   Ht 5\' 8"  (1.727 m)   Wt 152 lb (68.9 kg)   SpO2 96%   BMI 23.11 kg/m  GENERAL:  Well appearing elderly WM in NAD HEENT:  PERRL, EOMI, sclera are clear. Oropharynx is clear. NECK:  No jugular venous distention, carotid upstroke brisk and symmetric, no bruits, no thyromegaly or adenopathy LUNGS:  Clear to auscultation bilaterally CHEST:  Unremarkable HEART:  IRRR,  PMI not displaced or sustained,S1 and S2 within normal limits, no S3, no S4: no clicks, no rubs, no murmurs ABD:  Soft, nontender. BS +, no masses or bruits. No hepatomegaly, no splenomegaly EXT:  2 + pulses throughout, no edema, no cyanosis no  clubbing SKIN:  Warm and dry.  No rashes NEURO:  Alert and oriented x 3. Cranial nerves II through XII intact. PSYCH:  Cognitively intact   LABORATORY DATA: Lab Results  Component Value Date   WBC 8.8 04/28/2023   HGB 15.2 04/28/2023   HCT 46.4 04/28/2023   PLT 251 04/28/2023   GLUCOSE 91 04/28/2023   CHOL 137 08/29/2018   TRIG 57 08/29/2018   HDL 74 08/29/2018   LDLCALC 52 08/29/2018   ALT 13 08/29/2018   AST 24 08/29/2018   NA 139 04/28/2023   K 3.8 04/28/2023   CL 103 04/28/2023   CREATININE 0.88 04/28/2023   BUN 11 04/28/2023   CO2 26 04/28/2023   TSH 2.02 11/14/2013   INR 1.1 04/28/2023   Labs dated 10/04/19: cholesterol 145, triglycerides 75, HDL 68, LDL 61. CBC and chemistries normal Dated 10/21/20: cholesterol 141, triglycerides 64, HDL 71, LDL 56. CBC normal Dated 11/17/20: potassium 5.4 otherwise CMET normal. Dated 11/03/21: cholesterol 139, triglycerides 64, HDL 71, LDL 55. CMET and CBC normal. Dated 11/10/22: cholesterol 145, triglycerides 63, HDL 71, LDL 61. CBC and CMET normal      EKG Interpretation Date/Time:  Monday July 04 2023 10:58:46 EDT Ventricular Rate:  66 PR Interval:    QRS Duration:  80 QT Interval:  356 QTC Calculation: 373 R Axis:   -46  Text Interpretation: Atrial fibrillation Left axis deviation Possible Anterior infarct (cited on or before 02-Nov-2008) When compared with ECG of 05-May-2009 07:43, T wave inversion is less in the anterolateral leads. Confirmed by [place], Naome Brigandi [phone]) on 07/04/2023 11:04:59 AM     Assessment / Plan: 1. Coronary disease with remote anterior myocardial infarction 2009 treated with drug-eluting stent to the LAD.   He is asymptomatic. Will continue medical therapy.  2. Hypertension, well controlled. Avoid ACEi/ARB due to history of elevated potassium.  3. Hypercholesterolemia, on Zocor. Excellent control. At goal  LDL 61.  4. Left ventricular dysfunction. Ejection fraction 40-45% by Echo 2014. Unchanged  from 2009. Well compensated and asymptomatic.   5. Atrial fibrillation. Rate is controlled on low-dose metoprolol. Patient is  asymptomatic. [place] score is 3. Continue Xarelto 20 mg daily.  6. Vertebral compression fracture. S/p vertebroplasty.   I will follow up in 6 months

## 2023-07-04 ENCOUNTER — Ambulatory Visit: Payer: Medicare Other | Admitting: Cardiology

## 2023-07-04 ENCOUNTER — Encounter: Payer: Self-pay | Admitting: Cardiology

## 2023-07-04 VITALS — BP 126/80 | HR 66 | Ht 68.0 in | Wt 152.0 lb

## 2023-07-04 DIAGNOSIS — I2510 Atherosclerotic heart disease of native coronary artery without angina pectoris: Secondary | ICD-10-CM | POA: Insufficient documentation

## 2023-07-04 DIAGNOSIS — E785 Hyperlipidemia, unspecified: Secondary | ICD-10-CM | POA: Insufficient documentation

## 2023-07-04 DIAGNOSIS — I482 Chronic atrial fibrillation, unspecified: Secondary | ICD-10-CM | POA: Insufficient documentation

## 2023-07-04 DIAGNOSIS — Z9861 Coronary angioplasty status: Secondary | ICD-10-CM | POA: Diagnosis not present

## 2023-07-04 MED ORDER — RIVAROXABAN 20 MG PO TABS: 20.0000 mg | ORAL_TABLET | Freq: Every day | ORAL | 0 refills | Status: DC

## 2023-07-04 NOTE — Addendum Note (Signed)
Addended by: Neoma Laming on: 07/04/2023 01:32 PM   Modules accepted: Orders

## 2023-07-04 NOTE — Patient Instructions (Signed)
Medication Instructions:  Continue same medications *If you need a refill on your cardiac medications before your next appointment, please call your pharmacy*   Lab Work: None ordered   Testing/Procedures: None ordered   Follow-Up: At [facility], you and your health needs are our priority.  As part of our continuing mission to provide you with exceptional heart care, we have created designated Provider Care Teams.  These Care Teams include your primary Cardiologist (physician) and Advanced Practice Providers (APPs -  Physician Assistants and Nurse Practitioners) who all work together to provide you with the care you need, when you need it.  We recommend signing up for the patient portal called "MyChart".  Sign up information is provided on this After Visit Summary.  MyChart is used to connect with patients for Virtual Visits (Telemedicine).  Patients are able to view lab/test results, encounter notes, upcoming appointments, etc.  Non-urgent messages can be sent to your provider as well.   To learn more about what you can do with MyChart, go to [email or web address].    Your next appointment:  6 months    Call in Sept to schedule Jan appointment     Provider:  Dr.Jordan   

## 2023-08-23 ENCOUNTER — Telehealth: Payer: Self-pay

## 2023-08-23 DIAGNOSIS — I482 Chronic atrial fibrillation, unspecified: Secondary | ICD-10-CM

## 2023-08-23 MED ORDER — RIVAROXABAN 20 MG PO TABS: 20.0000 mg | ORAL_TABLET | Freq: Every day | ORAL | 0 refills | Status: DC

## 2023-08-23 MED ORDER — RIVAROXABAN 20 MG PO TABS
ORAL_TABLET | ORAL | 3 refills | Status: DC
Start: 2023-08-23 — End: 2024-08-20

## 2023-08-23 NOTE — Telephone Encounter (Signed)
Xarelto 20 mg samples given to patient.

## 2023-09-05 ENCOUNTER — Other Ambulatory Visit: Payer: Self-pay

## 2023-09-05 ENCOUNTER — Emergency Department (HOSPITAL_COMMUNITY)
Admission: EM | Admit: 2023-09-05 | Discharge: 2023-09-05 | Disposition: A | Discharge status: Home / Self Care | Payer: Medicare Other | Attending: Emergency Medicine | Admitting: Emergency Medicine

## 2023-09-05 ENCOUNTER — Emergency Department (HOSPITAL_COMMUNITY): Payer: Medicare Other

## 2023-09-05 DIAGNOSIS — Z79899 Other long term (current) drug therapy: Secondary | ICD-10-CM | POA: Insufficient documentation

## 2023-09-05 DIAGNOSIS — E876 Hypokalemia: Secondary | ICD-10-CM | POA: Diagnosis not present

## 2023-09-05 DIAGNOSIS — R0602 Shortness of breath: Secondary | ICD-10-CM | POA: Insufficient documentation

## 2023-09-05 DIAGNOSIS — I2510 Atherosclerotic heart disease of native coronary artery without angina pectoris: Secondary | ICD-10-CM | POA: Diagnosis not present

## 2023-09-05 DIAGNOSIS — J181 Lobar pneumonia, unspecified organism: Secondary | ICD-10-CM | POA: Insufficient documentation

## 2023-09-05 DIAGNOSIS — Z8546 Personal history of malignant neoplasm of prostate: Secondary | ICD-10-CM | POA: Insufficient documentation

## 2023-09-05 DIAGNOSIS — I255 Ischemic cardiomyopathy: Secondary | ICD-10-CM | POA: Insufficient documentation

## 2023-09-05 DIAGNOSIS — I10 Essential (primary) hypertension: Secondary | ICD-10-CM | POA: Diagnosis not present

## 2023-09-05 DIAGNOSIS — Z7901 Long term (current) use of anticoagulants: Secondary | ICD-10-CM | POA: Diagnosis not present

## 2023-09-05 DIAGNOSIS — J189 Pneumonia, unspecified organism: Secondary | ICD-10-CM

## 2023-09-05 DIAGNOSIS — I4891 Unspecified atrial fibrillation: Secondary | ICD-10-CM | POA: Insufficient documentation

## 2023-09-05 DIAGNOSIS — R531 Weakness: Secondary | ICD-10-CM | POA: Diagnosis present

## 2023-09-05 LAB — CBC
HCT: 40.4 % (ref 39.0–52.0)
Hemoglobin: 13.3 g/dL (ref 13.0–17.0)
MCH: 32.6 pg (ref 26.0–34.0)
MCHC: 32.9 g/dL (ref 30.0–36.0)
MCV: 99 fL (ref 80.0–100.0)
Platelets: 150 10*3/uL (ref 150–400)
RBC: 4.08 MIL/uL — ABNORMAL LOW (ref 4.22–5.81)
RDW: 13.3 % (ref 11.5–15.5)
WBC: 8.1 10*3/uL (ref 4.0–10.5)
nRBC: 0 % (ref 0.0–0.2)

## 2023-09-05 LAB — BASIC METABOLIC PANEL
Anion gap: 13 (ref 5–15)
BUN: 17 mg/dL (ref 8–23)
CO2: 25 mmol/L (ref 22–32)
Calcium: 9.3 mg/dL (ref 8.9–10.3)
Chloride: 104 mmol/L (ref 98–111)
Creatinine, Ser: 0.93 mg/dL (ref 0.61–1.24)
GFR, Estimated: >60 mL/min (ref >60)
Glucose, Bld: 110 mg/dL — ABNORMAL HIGH (ref 70–99)
Potassium: 3.4 mmol/L — ABNORMAL LOW (ref 3.5–5.1)
Sodium: 142 mmol/L (ref 135–145)

## 2023-09-05 LAB — BRAIN NATRIURETIC PEPTIDE: B Natriuretic Peptide: 449.4 pg/mL — ABNORMAL HIGH (ref 0.0–100.0)

## 2023-09-05 LAB — TROPONIN I (HIGH SENSITIVITY)
Troponin I (High Sensitivity): 11 ng/L (ref <18)
Troponin I (High Sensitivity): 9 ng/L (ref <18)

## 2023-09-05 MED ORDER — AMOXICILLIN-POT CLAVULANATE 875-125 MG PO TABS: 1.0000 | ORAL_TABLET | Freq: Two times a day (BID) | ORAL | 0 refills | Status: AC

## 2023-09-05 MED ORDER — SODIUM CHLORIDE 0.9 % IV BOLUS
500.0000 mL | Freq: Once | INTRAVENOUS | Status: AC
  Administered 2023-09-05: 500 mL via INTRAVENOUS

## 2023-09-05 MED ORDER — SODIUM CHLORIDE 0.9 % IV SOLN
1.0000 g | Freq: Once | INTRAVENOUS | Status: AC
  Administered 2023-09-05: 1 g via INTRAVENOUS
  Filled 2023-09-05: qty 10

## 2023-09-05 MED ORDER — SODIUM CHLORIDE 0.9 % IV SOLN
500.0000 mg | Freq: Once | INTRAVENOUS | Status: AC
  Administered 2023-09-05: 500 mg via INTRAVENOUS
  Filled 2023-09-05: qty 5

## 2023-09-05 MED ORDER — POTASSIUM CHLORIDE CRYS ER 20 MEQ PO TBCR
40.0000 meq | EXTENDED_RELEASE_TABLET | Freq: Once | ORAL | Status: AC
  Administered 2023-09-05: 40 meq via ORAL
  Filled 2023-09-05: qty 2

## 2023-09-05 NOTE — ED Provider Notes (Addendum)
  Physical Exam  BP (!) 162/96   Pulse 81   Temp (!) 97.5 F (36.4 C) (Oral)   Resp 17   SpO2 99%     Procedures  Procedures  ED Course / MDM   Clinical Course as of 09/05/23 0709  [facility] Sep 05, 2023  0529 Get BNP to help assess volume status [SG]  0646 Chest x-ray is concerning for pneumonia.  Does report diaphoresis earlier today, generalized weakness.  No significant dyspnea or chest pain.  He is not hypoxic.  Will start Rocephin azithromycin.  He is not septic.  Labs so far are reassuring, potassium is mildly depleted and replaced orally.  Initial troponin is not elevated, EKG without ischemic changes.  Pending delta troponin.  If this is stable can likely be discharged, signed out to incoming EDP [SG]  0705 B Natriuretic Peptide(!): 449.4 No hypoxia  [SG]    Clinical Course User Index [SG] Sloan Leiter, DO   Medical Decision Making Amount and/or Complexity of Data Reviewed Labs: ordered. Decision-making details documented in ED Course. Radiology: ordered.  Risk Prescription drug management.   37M presenting with afib with SOB, PNA seen on CXR, not on O2, getting Rocephin and Azithromycin, pending delta troponin. No chest pain, only SOB and fatigue.   The patient is reassessed, he is well-appearing, saturating 100% on room air in rate controlled atrial fibrillation rates in the 80s on cardiac telemetry.  He denies any chest pain or shortness of breath.  He presented with nonspecific fatigue and chills earlier today and was worried he was having a heart attack.  EKG was without ischemic changes and his delta troponin is normal.  Low concern for ACS.  Chest x-ray did show evidence of community-acquired pneumonia.  Will treat with a course of Augmentin, have him follow-up with his PCP, return precautions provided.  Stable for discharge, discharge planning communicated with family bedside.    Ernie Avena, MD 09/05/23 Cecilio Asper    Ernie Avena, MD 09/05/23 [phone]

## 2023-09-05 NOTE — ED Notes (Signed)
Pt wheeled to waiting room. Pt verbalized understanding of discharge instructions.   

## 2023-09-05 NOTE — Discharge Instructions (Addendum)
It was a pleasure caring for you today in the emergency department. Take a course of ABX and follow-up with your PCP to ensure resolution.  Please return to the emergency department for any worsening or worrisome symptoms.

## 2023-09-05 NOTE — ED Triage Notes (Signed)
PT BIB [facility], PT stated to EMS that he suddenly woke up in the middle of the night with night sweats and it made him anxious. PT C/O at the current moment is weakness. PT has a Hx of  A-Fib. PT denies SOB or CP. EMS stated the PT VS they obtained were 154/94, BGL 130, pulse 66.

## 2023-09-05 NOTE — ED Provider Notes (Signed)
[place] EMERGENCY DEPARTMENT AT [facility] Provider Note  CSN: [id number] Arrival date & time: 09/05/23 0436  Chief Complaint(s) Weakness  HPI [name] is a 89 y.o. male with past medical history as below, significant for afib on xarelto, CAD, MI, HTN, HLD, prostate Ca who presents to the ED with complaint of weakness/ diaphoresis.   Patient reports he woke suddenly around 3 AM, he was diaphoretic, felt lightheaded, he tried to walk and felt weak overall.  No palpitations at onset of symptoms.  No chest pain or dyspnea.  No vomiting did feel slightly nauseated.  No syncope.  Symptoms have resolved since the onset.  He is compliant with his home medications.  Feels back to baseline currently   Past Medical History Past Medical History:  Diagnosis Date  . Atrial fibrillation (HCC)   . Coronary artery disease    Nov 2009-MI-stenting of the mid LAD drug-eluding stent  . Hx of radiation therapy 05/15/14- 07/11/14   prostate 7800 cGy 40 sessions, seminal vesicles 5600 cGy 40 sessions  . Hyperlipidemia   . Hypertension   . LV dysfunction   . MI (myocardial infarction) (HCC) 1998  . Osteopenia   . Prostate CA (HCC) 05/05/2009   prostate cancer 11/09-tx with cryotherapy, Dr Patsi Sears   Patient Active Problem List   Diagnosis Date Noted  . History of myocardial infarction 10/02/2020  . Anticoagulated 10/02/2020  . Chronic atrial fibrillation (HCC) 11/14/2013  . CAD S/P percutaneous coronary angioplasty   . Essential hypertension   . Ischemic cardiomyopathy   . Dyslipidemia, goal LDL below 70   . History of prostate cancer 05/05/2009   Home Medication(s) Prior to Admission medications   Medication Sig Start Date End Date Taking? Authorizing Provider  amLODipine (NORVASC) 10 MG tablet Take 1 tablet by mouth once daily 01/07/23   [place], Peter M, MD  calcium carbonate (OS-CAL) 600 MG TABS Take 600 mg by mouth 2 (two) times daily with a meal.    [provider]  Cholecalciferol (VITAMIN D3) 2000 UNITS TABS Take 2,000 Int'l Units by mouth daily.    [provider]  metoprolol tartrate (LOPRESSOR) 50 MG tablet Take 1/2 (one-half) tablet by mouth twice daily 01/07/23   [place], Peter M, MD  rivaroxaban Carlena Hurl) 20 MG TABS tablet TAKE 1 TABLET BY MOUTH DAILY WITH SUPPER 08/23/23   [place], Peter M, MD  rivaroxaban (XARELTO) 20 MG TABS tablet Take 1 tablet (20 mg total) by mouth daily with supper. 08/23/23   [place], Peter M, MD  simvastatin (ZOCOR) 40 MG tablet TAKE 1 TABLET BY MOUTH AT BEDTIME 01/07/23   [place], Peter M, MD                                                                                                                                    Past Surgical History Past Surgical History:  Procedure Laterality Date  . CARDIAC  CATHETERIZATION     Nov 2009-drug eluding stent to mid LAD  . HERNIA REPAIR     inguinal  . IR KYPHO EA ADDL LEVEL THORACIC OR LUMBAR  04/28/2023  . IR KYPHO LUMBAR INC FX REDUCE BONE BX UNI/BIL CANNULATION INC/IMAGING  04/28/2023  . PROSTATE BIOPSY  09/25/2008   gleason 4+3=7  . PROSTATE CRYOABLATION  05/05/2009   Dr Patsi Sears   Family History Family History  Problem Relation Age of Onset  . Heart disease Mother   . Heart failure Mother   . Heart disease Brother   . Hematuria Father   . Cancer Father        prostate  . Heart disease Sister     Social History Social History   Tobacco Use  . Smoking status: Never  . Smokeless tobacco: Never  Substance Use Topics  . Alcohol use: No  . Drug use: No   Allergies Patient has no known allergies.  Review of Systems Review of Systems  Constitutional:  Positive for diaphoresis. Negative for appetite change, chills and fever.  HENT:  Negative for trouble swallowing.   Respiratory:  Negative for chest tightness and shortness of breath.   Cardiovascular:  Negative for chest pain and palpitations.  Gastrointestinal:  Positive for nausea. Negative for  abdominal pain, diarrhea and vomiting.  Genitourinary:  Negative for difficulty urinating.  Musculoskeletal:  Negative for myalgias.  Neurological:  Positive for weakness.  All other systems reviewed and are negative.   Physical Exam Vital Signs  I have reviewed the triage vital signs BP (!) 162/96   Pulse 81   Temp (!) 97.5 F (36.4 C) (Oral)   Resp 17   SpO2 99%  Physical Exam Vitals and nursing note reviewed.  Constitutional:      General: He is not in acute distress.    Appearance: Normal appearance. He is well-developed. He is not ill-appearing.  HENT:     Head: Normocephalic and atraumatic.     Right Ear: External ear normal.     Left Ear: External ear normal.     Mouth/Throat:     Mouth: Mucous membranes are moist.  Eyes:     General: No scleral icterus. Cardiovascular:     Rate and Rhythm: Normal rate. Rhythm irregular.     Pulses: Normal pulses.     Heart sounds: Normal heart sounds.  Pulmonary:     Effort: Pulmonary effort is normal. No respiratory distress.     Breath sounds: Normal breath sounds.  Abdominal:     General: Abdomen is flat.     Palpations: Abdomen is soft.     Tenderness: There is no abdominal tenderness.  Musculoskeletal:     Cervical back: No rigidity.     Right lower leg: No edema.     Left lower leg: No edema.  Skin:    General: Skin is warm and dry.     Capillary Refill: Capillary refill takes less than 2 seconds.  Neurological:     Mental Status: He is alert and oriented to person, place, and time. Mental status is at baseline.     GCS: GCS eye subscore is 4. GCS verbal subscore is 5. GCS motor subscore is 6.  Psychiatric:        Mood and Affect: Mood normal.        Behavior: Behavior normal.     ED Results and Treatments Labs (all labs ordered are listed, but only abnormal results are displayed) Labs Reviewed  BASIC METABOLIC PANEL - Abnormal; Notable for the following components:      Result Value   Potassium 3.4 (*)     Glucose, Bld 110 (*)    All other components within normal limits  CBC - Abnormal; Notable for the following components:   RBC 4.08 (*)    All other components within normal limits  BRAIN NATRIURETIC PEPTIDE  TROPONIN I (HIGH SENSITIVITY)                                                                                                                          Radiology DG Chest Port 1 View  Result Date: 09/05/2023 CLINICAL DATA:  Chest pain EXAM: PORTABLE CHEST 1 VIEW COMPARISON:  05/05/2009 FINDINGS: Low volume film with asymmetric elevation right hemidiaphragm. New patchy airspace disease is seen in the left mid and lower lung. The cardio pericardial silhouette is enlarged. No acute bony abnormality. Telemetry leads overlie the chest. IMPRESSION: Low volume film with patchy airspace disease in the left mid and lower lung. Imaging features highly suspicious for pneumonia. Electronically Signed   By: Kennith Center M.D.   On: 09/05/2023 05:52    Pertinent labs & imaging results that were available during my care of the patient were reviewed by me and considered in my medical decision making (see MDM for details).  Medications Ordered in ED Medications  potassium chloride SA (KLOR-CON M) CR tablet 40 mEq (has no administration in time range)  cefTRIAXone (ROCEPHIN) 1 g in sodium chloride 0.9 % 100 mL IVPB (has no administration in time range)  azithromycin (ZITHROMAX) 500 mg in sodium chloride 0.9 % 250 mL IVPB (has no administration in time range)  sodium chloride 0.9 % bolus 500 mL (0 mLs Intravenous Stopped 09/05/23 0960)                                                                                                                                     Procedures Procedures  (including critical care time)  Medical Decision Making / ED Course    Medical Decision Making:    [name] is a 89 y.o. male  with past medical history as below, significant for afib, CAD, MI, HTN, HLD,  prostate Ca who presents to the ED with complaint of weakness/ diaphoresis. . The complaint involves an extensive differential diagnosis and also carries with it a high risk of  complications and morbidity.  Serious etiology was considered. Ddx includes but is not limited to: ACS, afib, arrhythmia, hypovolemia, anxiety, infectious, metabolic, etc  Complete initial physical exam performed, notably the patient  was NAD, asymptomatic .    Reviewed and confirmed nursing documentation for past medical history, family history, social history.  Vital signs reviewed.    Clinical Course as of 09/05/23 0706  Mon Sep 05, 2023  4034 Get BNP to help assess volume status [SG]  0646 Chest x-ray is concerning for pneumonia.  Does report diaphoresis earlier today, generalized weakness.  No significant dyspnea or chest pain.  He is not hypoxic.  Will start Rocephin azithromycin.  He is not septic.  Labs so far are reassuring, potassium is mildly depleted and replaced orally.  Initial troponin is not elevated, EKG without ischemic changes.  Pending delta troponin.  If this is stable can likely be discharged, signed out to incoming EDP [SG]  0705 B Natriuretic Peptide(!): 449.4 No hypoxia  [SG]    Clinical Course User Index [SG] Tanda Rockers A, DO     No chest pain, symptoms likely secondary to pneumonia, less likely ACS.  Labs stable, on room air, HDS  PSI score is 88, class 3  Symptoms are mild, no hypoxia, not septic                     Additional history obtained: -Additional history obtained from family -External records from outside source obtained and reviewed including: Chart review including previous notes, labs, imaging, consultation notes including  Home medications, prior labs and imaging   Lab Tests: -I ordered, reviewed, and interpreted labs.   The pertinent results include:   Labs Reviewed  BASIC METABOLIC PANEL - Abnormal; Notable for the following components:       Result Value   Potassium 3.4 (*)    Glucose, Bld 110 (*)    All other components within normal limits  CBC - Abnormal; Notable for the following components:   RBC 4.08 (*)    All other components within normal limits  BRAIN NATRIURETIC PEPTIDE  TROPONIN I (HIGH SENSITIVITY)    Notable for mildly reduced potassium  EKG   EKG Interpretation Date/Time:  Monday September 05 2023 04:42:30 EDT Ventricular Rate:  80 PR Interval:    QRS Duration:  90 QT Interval:  432 QTC Calculation: 499 R Axis:   -43  Text Interpretation: Atrial fibrillation Left anterior fascicular block Minimal ST depression, lateral leads Borderline prolonged QT interval no stemi Confirmed by Tanda Rockers ([id number]) on 09/05/2023 4:46:11 AM         Imaging Studies ordered: I ordered imaging studies including chest x-ray I independently visualized the following imaging with scope of interpretation limited to determining acute life threatening conditions related to emergency care; findings noted above, significant for possible pneumonia I independently visualized and interpreted imaging. I agree with the radiologist interpretation   Medicines ordered and prescription drug management: Meds ordered this encounter  Medications  . sodium chloride 0.9 % bolus 500 mL  . potassium chloride SA (KLOR-CON M) CR tablet 40 mEq  . cefTRIAXone (ROCEPHIN) 1 g in sodium chloride 0.9 % 100 mL IVPB    Order Specific Question:   Antibiotic Indication:    Answer:   CAP  . azithromycin (ZITHROMAX) 500 mg in sodium chloride 0.9 % 250 mL IVPB    -I have reviewed the patients home medicines and have made adjustments as needed   Consultations Obtained: Not  applicable  Cardiac Monitoring: The patient was maintained on a cardiac monitor.  I personally viewed and interpreted the cardiac monitored which showed an underlying rhythm of: Atrial fibrillation  Social Determinants of Health:  Diagnosis or treatment significantly limited by  social determinants of health: Non-smoker   Reevaluation: After the interventions noted above, I reevaluated the patient and found that they have improved  Co morbidities that complicate the patient evaluation . Past Medical History:  Diagnosis Date  . Atrial fibrillation (HCC)   . Coronary artery disease    Nov 2009-MI-stenting of the mid LAD drug-eluding stent  . Hx of radiation therapy 05/15/14- 07/11/14   prostate 7800 cGy 40 sessions, seminal vesicles 5600 cGy 40 sessions  . Hyperlipidemia   . Hypertension   . LV dysfunction   . MI (myocardial infarction) (HCC) 1998  . Osteopenia   . Prostate CA (HCC) 05/05/2009   prostate cancer 11/09-tx with cryotherapy, Dr Patsi Sears      Dispostion: Disposition decision including need for hospitalization was considered, and patient disposition pending at time of sign out.    Final Clinical Impression(s) / ED Diagnoses Final diagnoses:  Community acquired pneumonia of left lung, unspecified part of lung  Hypokalemia        Sloan Leiter, DO 09/05/23 [phone]

## 2023-09-12 LAB — BASIC METABOLIC PANEL: EGFR: 77

## 2023-09-15 ENCOUNTER — Encounter (HOSPITAL_COMMUNITY): Payer: Self-pay | Admitting: Family Medicine

## 2023-09-15 ENCOUNTER — Other Ambulatory Visit (HOSPITAL_COMMUNITY): Payer: Self-pay | Admitting: Family Medicine

## 2023-09-15 DIAGNOSIS — R7989 Other specified abnormal findings of blood chemistry: Secondary | ICD-10-CM

## 2023-09-15 DIAGNOSIS — I4891 Unspecified atrial fibrillation: Secondary | ICD-10-CM

## 2023-09-16 ENCOUNTER — Ambulatory Visit (HOSPITAL_COMMUNITY): Payer: Medicare Other | Attending: Cardiovascular Disease

## 2023-09-16 DIAGNOSIS — I4891 Unspecified atrial fibrillation: Secondary | ICD-10-CM | POA: Insufficient documentation

## 2023-09-16 LAB — ECHOCARDIOGRAM COMPLETE: S' Lateral: 4.98 cm

## 2023-09-23 ENCOUNTER — Ambulatory Visit: Payer: Medicare Other | Attending: Cardiology | Admitting: Cardiology

## 2023-09-23 ENCOUNTER — Telehealth: Payer: Self-pay | Admitting: Cardiology

## 2023-09-23 ENCOUNTER — Encounter: Payer: Self-pay | Admitting: Cardiology

## 2023-09-23 VITALS — BP 122/84 | HR 83 | Ht 67.0 in | Wt 155.0 lb

## 2023-09-23 DIAGNOSIS — Z9861 Coronary angioplasty status: Secondary | ICD-10-CM | POA: Insufficient documentation

## 2023-09-23 DIAGNOSIS — I10 Essential (primary) hypertension: Secondary | ICD-10-CM | POA: Insufficient documentation

## 2023-09-23 DIAGNOSIS — I2510 Atherosclerotic heart disease of native coronary artery without angina pectoris: Secondary | ICD-10-CM | POA: Insufficient documentation

## 2023-09-23 DIAGNOSIS — I482 Chronic atrial fibrillation, unspecified: Secondary | ICD-10-CM | POA: Diagnosis present

## 2023-09-23 DIAGNOSIS — E785 Hyperlipidemia, unspecified: Secondary | ICD-10-CM | POA: Insufficient documentation

## 2023-09-23 DIAGNOSIS — I5043 Acute on chronic combined systolic (congestive) and diastolic (congestive) heart failure: Secondary | ICD-10-CM | POA: Insufficient documentation

## 2023-09-23 LAB — BASIC METABOLIC PANEL
BUN/Creatinine Ratio: 15 (ref 10–24)
BUN: 15 mg/dL (ref 8–27)
CO2: 26 mmol/L (ref 20–29)
Calcium: 10.4 mg/dL — ABNORMAL HIGH (ref 8.6–10.2)
Chloride: 104 mmol/L (ref 96–106)
Creatinine, Ser: 0.99 mg/dL (ref 0.76–1.27)
Glucose: 97 mg/dL (ref 70–99)
Potassium: 5 mmol/L (ref 3.5–5.2)
Sodium: 143 mmol/L (ref 134–144)
eGFR: 73 mL/min/{1.73_m2} (ref >59)

## 2023-09-23 MED ORDER — EMPAGLIFLOZIN 10 MG PO TABS
10.0000 mg | ORAL_TABLET | Freq: Every day | ORAL | 3 refills | Status: DC
Start: 2023-09-23 — End: 2023-11-02

## 2023-09-23 NOTE — Patient Instructions (Addendum)
Medication Instructions:  Start Jardiance 10 mg every morning before breakfast Continue all other medications *If you need a refill on your cardiac medications before your next appointment, please call your pharmacy*   Lab Work: Bmet today   Testing/Procedures: Cardiac Pet Scan   will be scheduled after approved by insurance   Follow instructions below   Follow-Up: At [facility], you and your health needs are our priority.  As part of our continuing mission to provide you with exceptional heart care, we have created designated Provider Care Teams.  These Care Teams include your primary Cardiologist (physician) and Advanced Practice Providers (APPs -  Physician Assistants and Nurse Practitioners) who all work together to provide you with the care you need, when you need it.  We recommend signing up for the patient portal called "MyChart".  Sign up information is provided on this After Visit Summary.  MyChart is used to connect with patients for Virtual Visits (Telemedicine).  Patients are able to view lab/test results, encounter notes, upcoming appointments, etc.  Non-urgent messages can be sent to your provider as well.   To learn more about what you can do with MyChart, go to [email or web address].    Your next appointment:  After test    Provider:  Dr.Jordan         How to Prepare for Your Cardiac PET/CT Stress Test:  1. Please do not take these medications before your test:   Medications that may interfere with the cardiac pharmacological stress agent (ex. nitrates - including erectile dysfunction medications, isosorbide mononitrate, tamulosin or beta-blockers) the day of the exam. Hold Metoprolol morning of test. Theophylline containing medications for 12 hours. Dipyridamole 48 hours prior to the test. Your remaining medications may be taken with water.  2. Nothing to eat or drink, except water, 3 hours prior to arrival time.   NO caffeine/decaffeinated products,  or chocolate 12 hours prior to arrival.  3. NO perfume, cologne or lotion on chest or abdomen area.            4. Total time is 1 to 2 hours; you may want to bring reading material for the waiting time.  5. Please report to Radiology at the [facility] Main Entrance 30 minutes early for your test.  [address] [place], [place] [address]  6. Please report to Radiology at [facility] Main Entrance, medical mall, 30 mins prior to your test.  [address]  [place], [place]  [phone]      In preparation for your appointment, medication and supplies will be purchased.  Appointment availability is limited, so if you need to cancel or reschedule, please call the Radiology Department at [phone] [facility]) OR [phone] [facility])  24 hours in advance to avoid a cancellation fee of $100.00  What to Expect After you Arrive:  Once you arrive and check in for your appointment, you will be taken to a preparation room within the Radiology Department.  A technologist or Nurse will obtain your medical history, verify that you are correctly prepped for the exam, and explain the procedure.  Afterwards,  an IV will be started in your arm and electrodes will be placed on your skin for EKG monitoring during the stress portion of the exam. Then you will be escorted to the PET/CT scanner.  There, staff will get you positioned on the scanner and obtain a blood pressure and EKG.  During the exam, you will continue to be  connected to the EKG and blood pressure machines.  A small, safe amount of a radioactive tracer will be injected in your IV to obtain a series of pictures of your heart along with an injection of a stress agent.    After your Exam:  It is recommended that you eat a meal and drink a caffeinated beverage to counter act any effects of the stress agent.  Drink plenty of fluids for the remainder of the day and urinate frequently for the first couple  of hours after the exam.  Your doctor will inform you of your test results within 7-10 business days.  For more information and frequently asked questions, please visit our website : [email or web address]  For questions about your test or how to prepare for your test, please call: Cardiac Imaging Nurse Navigators Office: [phone]

## 2023-09-23 NOTE — Telephone Encounter (Signed)
Patient daughter called wanting to know if [name] would want to move up her father's appt with Dr. Sw[place]iland as his appt for the PET scan is now schedule for 10/16. Please advise.

## 2023-09-23 NOTE — Telephone Encounter (Signed)
LM that will send the message to the nurse as there are no openings at this time.

## 2023-09-26 ENCOUNTER — Other Ambulatory Visit: Payer: Self-pay

## 2023-09-26 ENCOUNTER — Telehealth: Payer: Self-pay

## 2023-09-26 DIAGNOSIS — I5043 Acute on chronic combined systolic (congestive) and diastolic (congestive) heart failure: Secondary | ICD-10-CM

## 2023-09-26 MED ORDER — SACUBITRIL-VALSARTAN 24-26 MG PO TABS: 1.0000 | ORAL_TABLET | Freq: Two times a day (BID) | ORAL | 3 refills | Status: DC

## 2023-09-26 NOTE — Telephone Encounter (Signed)
Spoke to patient's daughter Aggie Cosier.Advised Dr.Jordan does not have any openings before 11/5.Advised I will call if he has a cancellation.

## 2023-09-26 NOTE — Telephone Encounter (Signed)
Spoke to patient he stated he cannot afford Jardiance too expensive.Advised he can apply to their patient assistance.I will mail him the paper work.Complete and bring back to office along with proof of income.

## 2023-10-03 ENCOUNTER — Telehealth: Payer: Self-pay

## 2023-10-03 NOTE — Telephone Encounter (Signed)
Spoke to patient he stated he will not qualify for [facility] patient assistance.Stated he wants to wait until 12/2023 and apply for a new program [employer] Act. He will continue Amlodipine 10 mg daily.Advised I will make Dr.Jordan aware.

## 2023-10-07 ENCOUNTER — Telehealth: Payer: Self-pay

## 2023-10-07 MED ORDER — VALSARTAN 160 MG PO TABS: 160.0000 mg | ORAL_TABLET | Freq: Every day | ORAL | 3 refills | Status: DC

## 2023-10-07 NOTE — Telephone Encounter (Signed)
Spoke to patient I spoke to Dr.Jordan about Sherryll Burger too expensive.He advised to stop Amlodipine and start Varsartan 160 mg daily.

## 2023-10-11 ENCOUNTER — Encounter (HOSPITAL_COMMUNITY): Payer: Self-pay

## 2023-10-12 ENCOUNTER — Ambulatory Visit (HOSPITAL_COMMUNITY)
Admission: RE | Admit: 2023-10-12 | Discharge: 2023-10-12 | Disposition: A | Discharge status: Home / Self Care | Payer: Medicare Other | Source: Ambulatory Visit | Attending: Cardiology | Admitting: Cardiology

## 2023-10-12 DIAGNOSIS — I10 Essential (primary) hypertension: Secondary | ICD-10-CM | POA: Diagnosis present

## 2023-10-12 DIAGNOSIS — I2510 Atherosclerotic heart disease of native coronary artery without angina pectoris: Secondary | ICD-10-CM | POA: Insufficient documentation

## 2023-10-12 DIAGNOSIS — Z9861 Coronary angioplasty status: Secondary | ICD-10-CM | POA: Insufficient documentation

## 2023-10-12 DIAGNOSIS — E785 Hyperlipidemia, unspecified: Secondary | ICD-10-CM | POA: Diagnosis present

## 2023-10-12 DIAGNOSIS — I5043 Acute on chronic combined systolic (congestive) and diastolic (congestive) heart failure: Secondary | ICD-10-CM | POA: Diagnosis present

## 2023-10-12 DIAGNOSIS — I482 Chronic atrial fibrillation, unspecified: Secondary | ICD-10-CM | POA: Diagnosis present

## 2023-10-12 LAB — NM PET CT CARDIAC PERFUSION MULTI W/ABSOLUTE BLOODFLOW
LV dias vol: 139 mL (ref 62–150)
Nuc Rest EF: 32 %
Nuc Stress EF: 34 %
Peak HR: 96 {beats}/min
Rest HR: 75 {beats}/min
Rest Nuclear Isotope Dose: 18.2 mCi
Rest perfusion cavity size (mL): 139 mL
ST Depression (mm): 0 mm
Stress Nuclear Isotope Dose: 18.3 mCi
Stress perfusion cavity size (mL): 130 mL
TID: 0.95

## 2023-10-12 MED ORDER — RUBIDIUM RB82 GENERATOR (RUBYFILL)
18.2500 | PACK | Freq: Once | INTRAVENOUS | Status: AC
  Administered 2023-10-12: 18.25 via INTRAVENOUS

## 2023-10-12 MED ORDER — REGADENOSON 0.4 MG/5ML IV SOLN
0.4000 mg | Freq: Once | INTRAVENOUS | Status: AC
  Administered 2023-10-12: 0.4 mg via INTRAVENOUS

## 2023-10-12 MED ORDER — RUBIDIUM RB82 GENERATOR (RUBYFILL)
18.3500 | PACK | Freq: Once | INTRAVENOUS | Status: AC
  Administered 2023-10-12: 18.35 via INTRAVENOUS

## 2023-10-12 MED ORDER — REGADENOSON 0.4 MG/5ML IV SOLN
INTRAVENOUS | Status: AC
  Filled 2023-10-12: qty 5

## 2023-10-17 ENCOUNTER — Encounter: Payer: Self-pay | Admitting: *Deleted

## 2023-10-17 ENCOUNTER — Telehealth: Payer: Self-pay | Admitting: *Deleted

## 2023-10-17 DIAGNOSIS — I2510 Atherosclerotic heart disease of native coronary artery without angina pectoris: Secondary | ICD-10-CM

## 2023-10-17 DIAGNOSIS — Z01812 Encounter for preprocedural laboratory examination: Secondary | ICD-10-CM

## 2023-10-17 NOTE — Telephone Encounter (Signed)
  [facility] A DEPT OF [facility] AT [facility] [address] [place] [address] [place] [address] Dept: [phone] Loc: [phone]  [name]  10/17/2023  You are scheduled for a Cardiac Catheterization on Wednesday, November 6 with Dr. Peter Swazil[place].  1. Please arrive at the [facility] (Main Entrance A) at [facility]: [address] [place], [place] [address] at 8:00 AM (This time is 2 hour(s) before your procedure to ensure your preparation). Free valet parking service is available. You will check in at ADMITTING. The support person will be asked to wait in the waiting room.  It is OK to have someone drop you off and come back when you are ready to be discharged.    Special note: Every effort is made to have your procedure done on time. Please understand that emergencies sometimes delay scheduled procedures.  2. Diet: Do not eat solid foods after midnight.  The patient may have clear liquids until 5am upon the day of the procedure.  3. Labs: You will need to have blood drawn on Wed 10/30 am at Dr. Swaziland office lab. You do not need to be fasting.  4. Medication instructions in preparation for your procedure:   Hod Jardiance 3 days before Cath.  Hold Xarelto 2 days before Cath    On the morning of your procedure, take your Aspirin 81 mg and any morning medicines NOT listed above.  You may use sips of water.  5. Plan to go home the same day, you will only stay overnight if medically necessary. 6. Bring a current list of your medications and current insurance cards. 7. You MUST have a responsible person to drive you home. 8. Someone MUST be with you the first 24 hours after you arrive home or your discharge will be delayed. 9. Please wear clothes that are easy to get on and off and wear slip-on shoes.  Thank you for allowing [place] to care for you!   -- [facility]

## 2023-10-19 ENCOUNTER — Other Ambulatory Visit: Payer: Self-pay | Admitting: Cardiology

## 2023-10-19 DIAGNOSIS — I5042 Chronic combined systolic (congestive) and diastolic (congestive) heart failure: Secondary | ICD-10-CM

## 2023-10-27 LAB — CBC WITH DIFFERENTIAL/PLATELET
Basophils Absolute: 0 10*3/uL (ref 0.0–0.2)
Basos: 1 %
EOS (ABSOLUTE): 0.1 10*3/uL (ref 0.0–0.4)
Eos: 2 %
Hematocrit: 45.6 % (ref 37.5–51.0)
Hemoglobin: 14.6 g/dL (ref 13.0–17.7)
Immature Grans (Abs): 0 10*3/uL (ref 0.0–0.1)
Immature Granulocytes: 0 %
Lymphocytes Absolute: 3 10*3/uL (ref 0.7–3.1)
Lymphs: 44 %
MCH: 32.9 pg (ref 26.6–33.0)
MCHC: 32 g/dL (ref 31.5–35.7)
MCV: 103 fL — ABNORMAL HIGH (ref 79–97)
Monocytes Absolute: 0.5 10*3/uL (ref 0.1–0.9)
Monocytes: 8 %
Neutrophils Absolute: 3 10*3/uL (ref 1.4–7.0)
Neutrophils: 45 %
Platelets: 170 10*3/uL (ref 150–450)
RBC: 4.44 x10E6/uL (ref 4.14–5.80)
RDW: 12 % (ref 11.6–15.4)
WBC: 6.6 10*3/uL (ref 3.4–10.8)

## 2023-10-27 LAB — BASIC METABOLIC PANEL
BUN/Creatinine Ratio: 15 (ref 10–24)
BUN: 14 mg/dL (ref 8–27)
CO2: 27 mmol/L (ref 20–29)
Calcium: 9.7 mg/dL (ref 8.6–10.2)
Chloride: 105 mmol/L (ref 96–106)
Creatinine, Ser: 0.92 mg/dL (ref 0.76–1.27)
Glucose: 106 mg/dL — ABNORMAL HIGH (ref 70–99)
Potassium: 4.4 mmol/L (ref 3.5–5.2)
Sodium: 144 mmol/L (ref 134–144)
eGFR: 80 mL/min/{1.73_m2} (ref >59)

## 2023-10-27 LAB — PT AND PTT
INR: 1.1 (ref 0.9–1.2)
Prothrombin Time: 12.6 s — ABNORMAL HIGH (ref 9.1–12.0)
aPTT: 29 s (ref 24–33)

## 2023-11-01 ENCOUNTER — Ambulatory Visit: Payer: Medicare Other | Admitting: Cardiology

## 2023-11-01 ENCOUNTER — Telehealth: Payer: Self-pay | Admitting: *Deleted

## 2023-11-01 NOTE — Telephone Encounter (Signed)
Cardiac Catheterization scheduled at [facility] for: Wednesday November 02, 2023 10 AM Arrival time [facility] Main Entrance A at: 8 AM  Nothing to eat after midnight prior to procedure, clear liquids until 5 AM day of procedure.  Medication instructions: -Hold:  Xarelto-none 10/31/23 until post procedure  Jardiance-pt reports he has not taken this in at least a month -Other usual morning medications can be taken with sips of water including aspirin 81 mg.  Plan to go home the same day, you will only stay overnight if medically necessary.  You must have responsible adult to drive you home.  Someone must be with you the first 24 hours after you arrive home.  Reviewed procedure instructions with patient.

## 2023-11-02 ENCOUNTER — Other Ambulatory Visit: Payer: Self-pay

## 2023-11-02 ENCOUNTER — Ambulatory Visit (HOSPITAL_COMMUNITY)
Admission: RE | Disposition: A | Discharge status: Home / Self Care | Payer: Self-pay | Source: Home / Self Care | Attending: Cardiology

## 2023-11-02 ENCOUNTER — Ambulatory Visit (HOSPITAL_COMMUNITY)
Admission: RE | Admit: 2023-11-02 | Discharge: 2023-11-02 | Disposition: A | Discharge status: Home / Self Care | Payer: Medicare Other | Attending: Cardiology | Admitting: Cardiology

## 2023-11-02 DIAGNOSIS — I5043 Acute on chronic combined systolic (congestive) and diastolic (congestive) heart failure: Secondary | ICD-10-CM | POA: Diagnosis not present

## 2023-11-02 DIAGNOSIS — Z7901 Long term (current) use of anticoagulants: Secondary | ICD-10-CM | POA: Insufficient documentation

## 2023-11-02 DIAGNOSIS — I110 Hypertensive heart disease with heart failure: Secondary | ICD-10-CM | POA: Diagnosis not present

## 2023-11-02 DIAGNOSIS — I5023 Acute on chronic systolic (congestive) heart failure: Secondary | ICD-10-CM | POA: Diagnosis not present

## 2023-11-02 DIAGNOSIS — E7800 Pure hypercholesterolemia, unspecified: Secondary | ICD-10-CM | POA: Insufficient documentation

## 2023-11-02 DIAGNOSIS — I252 Old myocardial infarction: Secondary | ICD-10-CM | POA: Diagnosis not present

## 2023-11-02 DIAGNOSIS — Z955 Presence of coronary angioplasty implant and graft: Secondary | ICD-10-CM | POA: Diagnosis not present

## 2023-11-02 DIAGNOSIS — I4891 Unspecified atrial fibrillation: Secondary | ICD-10-CM | POA: Insufficient documentation

## 2023-11-02 DIAGNOSIS — I272 Pulmonary hypertension, unspecified: Secondary | ICD-10-CM | POA: Diagnosis not present

## 2023-11-02 DIAGNOSIS — I2510 Atherosclerotic heart disease of native coronary artery without angina pectoris: Secondary | ICD-10-CM | POA: Insufficient documentation

## 2023-11-02 DIAGNOSIS — I5042 Chronic combined systolic (congestive) and diastolic (congestive) heart failure: Secondary | ICD-10-CM

## 2023-11-02 DIAGNOSIS — Z79899 Other long term (current) drug therapy: Secondary | ICD-10-CM | POA: Diagnosis not present

## 2023-11-02 HISTORY — PX: RIGHT/LEFT HEART CATH AND CORONARY ANGIOGRAPHY: CATH118266

## 2023-11-02 LAB — POCT I-STAT EG7
Acid-Base Excess: 0 mmol/L (ref 0.0–2.0)
Acid-base deficit: 2 mmol/L (ref 0.0–2.0)
Acid-base deficit: 1 mmol/L (ref 0.0–2.0)
Bicarbonate: 24.3 mmol/L (ref 20.0–28.0)
Bicarbonate: 24.6 mmol/L (ref 20.0–28.0)
Bicarbonate: 26.4 mmol/L (ref 20.0–28.0)
Calcium, Ion: 1.2 mmol/L (ref 1.15–1.40)
Calcium, Ion: 1.21 mmol/L (ref 1.15–1.40)
Calcium, Ion: 1.27 mmol/L (ref 1.15–1.40)
HCT: 43 % (ref 39.0–52.0)
HCT: 43 % (ref 39.0–52.0)
HCT: 43 % (ref 39.0–52.0)
Hemoglobin: 14.6 g/dL (ref 13.0–17.0)
Hemoglobin: 14.6 g/dL (ref 13.0–17.0)
Hemoglobin: 14.6 g/dL (ref 13.0–17.0)
O2 Saturation: 62 %
O2 Saturation: 63 %
O2 Saturation: 93 %
Potassium: 2.9 mmol/L — ABNORMAL LOW (ref 3.5–5.1)
Potassium: 3.1 mmol/L — ABNORMAL LOW (ref 3.5–5.1)
Potassium: 3.1 mmol/L — ABNORMAL LOW (ref 3.5–5.1)
Sodium: 139 mmol/L (ref 135–145)
Sodium: 142 mmol/L (ref 135–145)
Sodium: 144 mmol/L (ref 135–145)
TCO2: 26 mmol/L (ref 22–32)
TCO2: 26 mmol/L (ref 22–32)
TCO2: 28 mmol/L (ref 22–32)
pCO2, Ven: 48 mm[Hg] (ref 44–60)
pCO2, Ven: 42.6 mm[Hg] — ABNORMAL LOW (ref 44–60)
pCO2, Ven: 48.6 mm[Hg] (ref 44–60)
pH, Ven: 7.311 (ref 7.25–7.43)
pH, Ven: 7.343 (ref 7.25–7.43)
pH, Ven: 7.37 (ref 7.25–7.43)
pO2, Ven: 35 mm[Hg] (ref 32–45)
pO2, Ven: 35 mm[Hg] (ref 32–45)
pO2, Ven: 71 mm[Hg] — ABNORMAL HIGH (ref 32–45)

## 2023-11-02 SURGERY — RIGHT/LEFT HEART CATH AND CORONARY ANGIOGRAPHY
Anesthesia: LOCAL

## 2023-11-02 MED ORDER — VERAPAMIL HCL 2.5 MG/ML IV SOLN
INTRAVENOUS | Status: AC
  Filled 2023-11-02: qty 2

## 2023-11-02 MED ORDER — MIDAZOLAM HCL 2 MG/2ML IJ SOLN
INTRAMUSCULAR | Status: DC | PRN
  Administered 2023-11-02: 1 mg via INTRAVENOUS

## 2023-11-02 MED ORDER — ASPIRIN 81 MG PO CHEW: 81.0000 mg | CHEWABLE_TABLET | ORAL | Status: DC

## 2023-11-02 MED ORDER — MIDAZOLAM HCL 2 MG/2ML IJ SOLN
INTRAMUSCULAR | Status: AC
  Filled 2023-11-02: qty 2

## 2023-11-02 MED ORDER — HEPARIN (PORCINE) IN NACL 1000-0.9 UT/500ML-% IV SOLN
INTRAVENOUS | Status: DC | PRN
  Administered 2023-11-02 (×2): 500 mL

## 2023-11-02 MED ORDER — HEPARIN SODIUM (PORCINE) 1000 UNIT/ML IJ SOLN
INTRAMUSCULAR | Status: AC
  Filled 2023-11-02: qty 10

## 2023-11-02 MED ORDER — ONDANSETRON HCL 4 MG/2ML IJ SOLN: 4.0000 mg | Freq: Four times a day (QID) | INTRAMUSCULAR | Status: DC | PRN

## 2023-11-02 MED ORDER — SODIUM CHLORIDE 0.9% FLUSH: 10.0000 mL | Freq: Two times a day (BID) | INTRAVENOUS | Status: DC

## 2023-11-02 MED ORDER — FENTANYL CITRATE (PF) 100 MCG/2ML IJ SOLN
INTRAMUSCULAR | Status: DC | PRN
  Administered 2023-11-02: 25 ug via INTRAVENOUS

## 2023-11-02 MED ORDER — MIDAZOLAM HCL 2 MG/2ML IJ SOLN
INTRAMUSCULAR | Status: AC
Start: 2023-11-02 — End: ?
  Filled 2023-11-02: qty 2

## 2023-11-02 MED ORDER — SODIUM CHLORIDE 0.9% FLUSH: 3.0000 mL | Freq: Two times a day (BID) | INTRAVENOUS | Status: DC

## 2023-11-02 MED ORDER — SODIUM CHLORIDE 0.9 % IV SOLN: 250.0000 mL | INTRAVENOUS | Status: DC | PRN

## 2023-11-02 MED ORDER — HYDRALAZINE HCL 20 MG/ML IJ SOLN
10.0000 mg | Freq: Once | INTRAMUSCULAR | Status: AC
  Administered 2023-11-02: 10 mg via INTRAVENOUS
  Filled 2023-11-02: qty 1

## 2023-11-02 MED ORDER — LIDOCAINE HCL (PF) 1 % IJ SOLN
INTRAMUSCULAR | Status: DC | PRN
  Administered 2023-11-02 (×2): 2 mL

## 2023-11-02 MED ORDER — SODIUM CHLORIDE 0.9% FLUSH: 3.0000 mL | INTRAVENOUS | Status: DC | PRN

## 2023-11-02 MED ORDER — FENTANYL CITRATE (PF) 100 MCG/2ML IJ SOLN
INTRAMUSCULAR | Status: AC
  Filled 2023-11-02: qty 2

## 2023-11-02 MED ORDER — IOHEXOL 350 MG/ML SOLN
INTRAVENOUS | Status: DC | PRN
  Administered 2023-11-02: 39 mL

## 2023-11-02 MED ORDER — LIDOCAINE HCL (PF) 1 % IJ SOLN
INTRAMUSCULAR | Status: AC
  Filled 2023-11-02: qty 30

## 2023-11-02 MED ORDER — ACETAMINOPHEN 325 MG PO TABS: 650.0000 mg | ORAL_TABLET | ORAL | Status: DC | PRN

## 2023-11-02 MED ORDER — HEPARIN SODIUM (PORCINE) 1000 UNIT/ML IJ SOLN
INTRAMUSCULAR | Status: DC | PRN
  Administered 2023-11-02: 3500 [IU] via INTRAVENOUS

## 2023-11-02 MED ORDER — VERAPAMIL HCL 2.5 MG/ML IV SOLN
INTRAVENOUS | Status: DC | PRN
  Administered 2023-11-02: 10 mL via INTRA_ARTERIAL

## 2023-11-02 SURGICAL SUPPLY — 12 items
CATH 5FR JL3.5 JR4 ANG PIG MP (CATHETERS) IMPLANT
CATH BALLN WEDGE 5F 110CM (CATHETERS) IMPLANT
DEVICE RAD TR BAND REGULAR (VASCULAR PRODUCTS) IMPLANT
GLIDESHEATH SLEND SS 6F .021 (SHEATH) IMPLANT
GUIDEWIRE INQWIRE 1.5J.035X260 (WIRE) IMPLANT
INQWIRE 1.5J .035X260CM (WIRE) ×1
PACK CARDIAC CATHETERIZATION (CUSTOM PROCEDURE TRAY) ×1 IMPLANT
SET ATX-X65L (MISCELLANEOUS) IMPLANT
SHEATH GLIDE SLENDER 4/5FR (SHEATH) IMPLANT
SHEATH PROBE COVER 6X72 (BAG) IMPLANT
WIRE EMERALD 3MM-J .025X260CM (WIRE) IMPLANT
WIRE HI TORQ VERSACORE-J 145CM (WIRE) IMPLANT

## 2023-11-02 NOTE — Interval H&P Note (Signed)
History and Physical Interval Note:  11/02/2023 9:18 AM  [name]  has presented today for surgery, with the diagnosis of cardiac ct.  The various methods of treatment have been discussed with the patient and family. After consideration of risks, benefits and other options for treatment, the patient has consented to  Procedure(s): RIGHT/LEFT HEART CATH AND CORONARY ANGIOGRAPHY (N/A) as a surgical intervention.  The patient's history has been reviewed, patient examined, no change in status, stable for surgery.  I have reviewed the patient's chart and labs.  Questions were answered to the patient's satisfaction.   Cath Lab Visit (complete for each Cath Lab visit)  Clinical Evaluation Leading to the Procedure:   ACS: No.  Non-ACS:    Anginal Classification: CCS II  Anti-ischemic medical therapy: Maximal Therapy (2 or more classes of medications)  Non-Invasive Test Results: High-risk stress test findings: cardiac mortality >3%/year  Prior CABG: No previous CABG        Theron Arista [facility] 11/02/2023 9:18 AM

## 2023-11-02 NOTE — Discharge Instructions (Addendum)
Can resume Xarelto this evening   Radial Site Care  This sheet gives you information about how to care for yourself after your procedure. Your health care provider may also give you more specific instructions. If you have problems or questions, contact your health care provider. What can I expect after the procedure? After the procedure, it is common to have: Bruising and tenderness at the catheter insertion area. Follow these instructions at home: Medicines Take over-the-counter and prescription medicines only as told by your health care provider. Insertion site care Follow instructions from your health care provider about how to take care of your insertion site. Make sure you: Wash your hands with soap and water before you remove your bandage (dressing). If soap and water are not available, use hand sanitizer. May remove dressing in 24 hours. Check your insertion site every day for signs of infection. Check for: Redness, swelling, or pain. Fluid or blood. Pus or a bad smell. Warmth. Do no take baths, swim, or use a hot tub for 5 days. You may shower 24-48 hours after the procedure. Remove the dressing and gently wash the site with plain soap and water. Pat the area dry with a clean towel. Do not rub the site. That could cause bleeding. Do not apply powder or lotion to the site. Activity  For 24 hours after the procedure, or as directed by your health care provider: Do not flex or bend the affected arm. Do not push or pull heavy objects with the affected arm. Do not drive yourself home from the hospital or clinic. You may drive 24 hours after the procedure. Do not operate machinery or power tools. KEEP ARM ELEVATED THE REMAINDER OF THE DAY. Do not push, pull or lift anything that is heavier than 10 lb for 5 days. Ask your health care provider when it is okay to: Return to work or school. Resume usual physical activities or sports. Resume sexual activity. General  instructions If the catheter site starts to bleed, raise your arm and put firm pressure on the site. If the bleeding does not stop, get help right away. This is a medical emergency. DRINK PLENTY OF FLUIDS FOR THE NEXT 2-3 DAYS. No alcohol consumption for 24 hours after receiving sedation. If you went home on the same day as your procedure, a responsible adult should be with you for the first 24 hours after you arrive home. Keep all follow-up visits as told by your health care provider. This is important. Contact a health care provider if: You have a fever. You have redness, swelling, or yellow drainage around your insertion site. Get help right away if: You have unusual pain at the radial site. The catheter insertion area swells very fast. The insertion area is bleeding, and the bleeding does not stop when you hold steady pressure on the area. Your arm or hand becomes pale, cool, tingly, or numb. These symptoms may represent a serious problem that is an emergency. Do not wait to see if the symptoms will go away. Get medical help right away. Call your local emergency services (911 in the [place] Do not drive yourself to the hospital. Summary After the procedure, it is common to have bruising and tenderness at the site. Follow instructions from your health care provider about how to take care of your radial site wound. Check the wound every day for signs of infection.  This information is not intended to replace advice given to you by your health care provider. Make sure  you discuss any questions you have with your health care provider. Document Revised: 01/18/2018 Document Reviewed: 01/18/2018 Elsevier Patient Education  2020 [employer]    Brachial Site Care   This sheet gives you information about how to care for yourself after your procedure. Your health care provider may also give you more specific instructions. If you have problems or questions, contact your health care  provider. What can I expect after the procedure? After the procedure, it is common to have: Bruising and tenderness at the catheter insertion area. Follow these instructions at home:  Insertion site care Follow instructions from your health care provider about how to take care of your insertion site. Make sure you: Wash your hands with soap and water before you change your bandage (dressing). If soap and water are not available, use hand sanitizer. Remove your dressing as told by your health care provider. In 24 hours Check your insertion site every day for signs of infection. Check for: Redness, swelling, or pain. Pus or a bad smell. Warmth. You may shower 24-48 hours after the procedure. Do not apply powder or lotion to the site.  Activity For 24 hours after the procedure, or as directed by your health care provider: Do not push or pull heavy objects with the affected arm. Do not drive yourself home from the hospital or clinic. You may drive 24 hours after the procedure unless your health care provider tells you not to. Do not lift anything that is heavier than 10 lb (4.5 kg), or the limit that you are told, until your health care provider says that it is safe.  For 24 hours

## 2023-11-02 NOTE — H&P (Signed)
[name] Date of Birth: 05/19/1933     History of Present Illness: [name] is seen today for followup AFib and CAD. He has a history of an anterior myocardial infarction in November 2009. This was treated with a drug-eluting stent to the mid LAD.  He had a stress Myoview study November 2012. He is able to walk for 8 minutes. He had no clinical symptoms. Images showed a fixed anterior septal and apical defect. There was no ischemia. Ejection fraction was 44%.    In November 2014 he was found to be in atrial fibrillation with a controlled response. He was started on Xarelto. Echo showed EF 40-45% with moderate biatrial enlargement. Treated with rate control and anticoagulation.    He was recently seen in the ED on 09/05/23 and treated for community acquired PNA. Troponins normal. Ecg without acute change. Afib controlled. BNP was elevated 449. This was repeated on 9/17 and was 466. No old values for comparison. Echo done showing EF 35-40%. Moderate pulmonary HTN with RV systolic pressure estimated at 54 mm Hg. Severe biatrial enlargement. Moderate MR. He had potassium of 3.4 in ED. This was repleted and on repeat was 5.7 at PCP.    Patient did complain of SOB on exertion/walking.  Mild increase in edema. No orthopnea or PND. No chest pain.to further evaluate he had a PET CT done showing old anterior infarction as well as perfusion abnormality in the lateral wall. EF 31%. Felt to be high risk. Given these findings we recommended proceeding with right and left heart cath.    Medications Ordered Prior to Encounter        Current Outpatient Medications on File Prior to Visit  Medication Sig Dispense Refill   amLODipine (NORVASC) 10 MG tablet Take 1 tablet by mouth once daily 90 tablet 3   calcium carbonate (OS-CAL) 600 MG TABS Take 600 mg by mouth 2 (two) times daily with a meal.       Cholecalciferol (VITAMIN D3) 2000 UNITS TABS Take 2,000 Int'l Units by mouth daily.       metoprolol  tartrate (LOPRESSOR) 50 MG tablet Take 1/2 (one-half) tablet by mouth twice daily 90 tablet 3   rivaroxaban (XARELTO) 20 MG TABS tablet TAKE 1 TABLET BY MOUTH DAILY WITH SUPPER 90 tablet 3   simvastatin (ZOCOR) 40 MG tablet TAKE 1 TABLET BY MOUTH AT BEDTIME 90 tablet 3    No current facility-administered medications on file prior to visit.        Allergies  No Known Allergies         Past Medical History:  Diagnosis Date   Atrial fibrillation [facility])     Coronary artery disease      Nov 2009-MI-stenting of the mid LAD drug-eluding stent   Hx of radiation therapy 05/15/14- 07/11/14    prostate 7800 cGy 40 sessions, seminal vesicles 5600 cGy 40 sessions   Hyperlipidemia     Hypertension     LV dysfunction     MI (myocardial infarction) (HCC) 1998   Osteopenia     Prostate CA (HCC) 05/05/2009    prostate cancer 11/09-tx with cryotherapy, Dr Patsi Sears               Past Surgical History:  Procedure Laterality Date   CARDIAC CATHETERIZATION        Nov 2009-drug eluding stent to mid LAD   HERNIA REPAIR        inguinal   IR  KYPHO EA ADDL LEVEL THORACIC OR LUMBAR   04/28/2023   IR KYPHO LUMBAR INC FX REDUCE BONE BX UNI/BIL CANNULATION INC/IMAGING   04/28/2023   PROSTATE BIOPSY   09/25/2008    gleason 4+3=7   PROSTATE CRYOABLATION   05/05/2009    Dr Patsi Sears          Tobacco Use History  Social History       Tobacco Use  Smoking Status Never  Smokeless Tobacco Never        Social History       Substance and Sexual Activity  Alcohol Use No           Family History  Problem Relation Age of Onset   Heart disease Mother     Heart failure Mother     Heart disease Brother     Hematuria Father     Cancer Father          prostate   Heart disease Sister            Review of Systems: As noted in history of present illness.  All other systems were reviewed and are negative.   Physical Exam: BP 122/84 (BP Location: Left Arm, Patient Position: Sitting, Cuff Size:  Normal)   Pulse 83   Ht 5\' 7"  (1.702 m)   Wt 155 lb (70.3 kg)   SpO2 98%   BMI 24.28 kg/m  GENERAL:  Well appearing elderly WM in NAD HEENT:  PERRL, EOMI, sclera are clear. Oropharynx is clear. NECK:  JVD to 6 cm. carotid upstroke brisk and symmetric, no bruits, no thyromegaly or adenopathy LUNGS:  Clear to auscultation bilaterally CHEST:  Unremarkable HEART:  IRRR,  PMI not displaced or sustained,S1 and S2 within normal limits, no S3, no S4: no clicks, no rubs, no murmurs ABD:  Soft, nontender. BS +, no masses or bruits. No hepatomegaly, no splenomegaly EXT:  2 + pulses throughout, tr edema, no cyanosis no clubbing SKIN:  Warm and dry.  No rashes NEURO:  Alert and oriented x 3. Cranial nerves II through XII intact. PSYCH:  Cognitively intact     LABORATORY DATA: Recent Labs       Lab Results  Component Value Date    WBC 8.1 09/05/2023    HGB 13.3 09/05/2023    HCT 40.4 09/05/2023    PLT 150 09/05/2023    GLUCOSE 110 (H) 09/05/2023    CHOL 137 08/29/2018    TRIG 57 08/29/2018    HDL 74 08/29/2018    LDLCALC 52 08/29/2018    ALT 13 08/29/2018    AST 24 08/29/2018    NA 142 09/05/2023    K 3.4 (L) 09/05/2023    CL 104 09/05/2023    CREATININE 0.93 09/05/2023    BUN 17 09/05/2023    CO2 25 09/05/2023    TSH 2.02 11/14/2013    INR 1.1 04/28/2023      Labs dated 10/04/19: cholesterol 145, triglycerides 75, HDL 68, LDL 61. CBC and chemistries normal Dated 10/21/20: cholesterol 141, triglycerides 64, HDL 71, LDL 56. CBC normal Dated 11/17/20: potassium 5.4 otherwise CMET normal. Dated 11/03/21: cholesterol 139, triglycerides 64, HDL 71, LDL 55. CMET and CBC normal. Dated 11/10/22: cholesterol 145, triglycerides 63, HDL 71, LDL 61. CBC and CMET normal Dated 09/12/23: potassium 5.7 otherwise BMET normal       Echo 09/16/23: IMPRESSIONS     1. Left ventricular ejection fraction, by estimation, is 35 to 40%. The  left ventricle  has moderately decreased function. The left  ventricle  demonstrates regional wall motion abnormalities (see scoring  diagram/findings for description). The left  ventricular internal cavity size was moderately dilated. Left ventricular  diastolic function could not be evaluated. There is disproportionately  severe hypokinesis in the apex and mid-apical anterior and septal wlls.   2. Right ventricular systolic function is moderately reduced. The right  ventricular size is mildly enlarged. There is moderately elevated  pulmonary artery systolic pressure. The estimated right ventricular  systolic pressure is 53.9 mmHg.   3. Left atrial size was severely dilated.   4. Right atrial size was severely dilated.   5. The mitral valve is normal in structure. Moderate mitral valve  regurgitation. No evidence of mitral stenosis.   6. The aortic valve is tricuspid. There is mild calcification of the  aortic valve. There is mild thickening of the aortic valve. Aortic valve  regurgitation is not visualized. Aortic valve sclerosis/calcification is  present, without any evidence of  aortic stenosis.   7. Aortic dilatation noted. There is borderline dilatation of the aortic  root, measuring 38 mm. There is borderline dilatation of the ascending  aorta, measuring 38 mm.   8. The inferior vena cava is dilated in size with <50% respiratory  variability, suggesting right atrial pressure of 15 mmHg.   Comparison(s): Prior images unable to be directly viewed, comparison made  by report only.     PET CT:   There is a medium size, severe, fixed defect present in the apical septum and apex consistent with prior LAD infarction. There is a largely fixed, medium size, severe defect present in the basal to mid lateral wall consistent with infarction in the LCX territory. LVEF is moderately reduced and increases slightly with stress (32%->35%). MBF is not accurate due to prior revascularization. The findings are consistent with the patient's known LAD infarction,  but the lateral infarct appears new. There is no ischemia on this study. High-risk study based on new infarction and moderately reduced LVEF. Findings suggest ischemic cardiomyopathy.   LV perfusion is abnormal. There is no evidence of ischemia. There is evidence of infarction. Defect 1: There is a medium defect with severe reduction in uptake present in the apical to mid septal and apex location(s) that is fixed. There is abnormal wall motion in the defect area. Consistent with infarction. Defect 2: There is a medium defect with severe reduction in uptake present in the mid to basal lateral location(s) that is fixed. There is abnormal wall motion in the defect area. Consistent with infarction.   Rest left ventricular function is abnormal. Rest global function is moderately reduced. There were multiple regional abnormalities. Rest EF: 32%. Stress left ventricular function is abnormal. Stress global function is moderately reduced. There were multiple regional abnormalities. Stress EF: 34%. End diastolic cavity size is mildly enlarged.   Myocardial blood flow reserve is not reported in this patient due to technical or patient-specific concerns that affect accuracy.   Coronary calcium assessment not performed due to prior revascularization.   Findings are consistent with infarction. The study is high risk.   Electronically signed by Lennie Odor, MD _________________________________________________   Assessment / Plan: 1. Coronary disease with remote anterior myocardial infarction 2009 treated with drug-eluting stent to the LAD. PET CT now shows perfusion abnormality in lateral wall as well as old anterior infarct. High risk. Recommend right and left heart cath. The procedure and risks were reviewed including but not limited to death, myocardial infarction,  stroke, arrythmias, bleeding, transfusion, emergency surgery, dye allergy, or renal dysfunction. The patient voices understanding and is agreeable to  proceed.    2. Hypertension, well controlled.   3. Hypercholesterolemia, on Zocor. Excellent control. At goal  LDL 61.   4. Acute on chronic combined systolic/diastolic CHF. Left ventricular dysfunction. Ejection fraction has decreased to 35%. BNP elevated. Really does not appear to be volume overloaded. Plan Entresto. Will add Jardiance 10 mg daily. Hold off on diuretics pending results of cardiac cath. Sodium restriction.    5. Atrial fibrillation. Rate is controlled on low-dose metoprolol. Patient is  asymptomatic. [place] score is 3. Continue Xarelto 20 mg daily.   6. Vertebral compression fracture. S/p vertebroplasty.

## 2023-11-03 ENCOUNTER — Encounter (HOSPITAL_COMMUNITY): Payer: Self-pay | Admitting: Cardiology

## 2023-11-07 MED FILL — Fentanyl Citrate Preservative Free (PF) Inj 100 MCG/2ML: INTRAMUSCULAR | Qty: 2 | Status: AC

## 2023-11-14 NOTE — Progress Notes (Unsigned)
[name] Date of Birth: Nov 05, 1933   History of Present Illness: [name] is seen today for followup AFib and CAD. He has a history of an anterior myocardial infarction in November 2009. This was treated with a drug-eluting stent to the mid LAD.  He had a stress Myoview study November 2012. He is able to walk for 8 minutes. He had no clinical symptoms. Images showed a fixed anterior septal and apical defect. There was no ischemia. Ejection fraction was 44%.   In November 2014 he was found to be in atrial fibrillation with a controlled response. He was started on Xarelto. Echo showed EF 40-45% with moderate biatrial enlargement. Treated with rate control and anticoagulation.   He was recently seen in the ED on 09/05/23 and treated for community acquired PNA. Troponins normal. Ecg without acute change. Afib controlled. BNP was elevated 449. This was repeated on 9/17 and was 466. No old values for comparison. Echo done showing EF 35-40%. Moderate pulmonary HTN with RV systolic pressure estimated at 54 mm Hg. Severe biatrial enlargement. Moderate MR. He had potassium of 3.4 in ED. This was repleted and on repeat was 5.7 at PCP.   We performed a cardiac CT PET to evaluate further and this was interpreted as high risk. Cardiac cath showed nonobstructive disease except for a small PDA. He had mild pulmonary HTN with normal LV filling pressures.  He is doing well on follow up today. Seen with his daughter. His wife has been hospitalized with biliary obstruction from pancreatic CA and was unable to have Whipple surgery. Planning RT and chemo.    Current Outpatient Medications on File Prior to Visit  Medication Sig Dispense Refill   calcium carbonate (OS-CAL) 600 MG TABS Take 600 mg by mouth daily with breakfast.     Cholecalciferol (VITAMIN D3) 2000 UNITS TABS Take 2,000 Int'l Units by mouth daily.     metoprolol tartrate (LOPRESSOR) 50 MG tablet Take 1/2 (one-half) tablet by mouth twice daily 90  tablet 3   rivaroxaban (XARELTO) 20 MG TABS tablet TAKE 1 TABLET BY MOUTH DAILY WITH SUPPER 90 tablet 3   simvastatin (ZOCOR) 40 MG tablet TAKE 1 TABLET BY MOUTH AT BEDTIME 90 tablet 3   valsartan (DIOVAN) 160 MG tablet Take 1 tablet (160 mg total) by mouth daily. 90 tablet 3   No current facility-administered medications on file prior to visit.    No Known Allergies  Past Medical History:  Diagnosis Date   Atrial fibrillation [facility])    Coronary artery disease    Nov 2009-MI-stenting of the mid LAD drug-eluding stent   Hx of radiation therapy 05/15/14- 07/11/14   prostate 7800 cGy 40 sessions, seminal vesicles 5600 cGy 40 sessions   Hyperlipidemia    Hypertension    LV dysfunction    MI (myocardial infarction) (HCC) 1998   Osteopenia    Prostate CA (HCC) 05/05/2009   prostate cancer 11/09-tx with cryotherapy, Dr Patsi Sears    Past Surgical History:  Procedure Laterality Date   CARDIAC CATHETERIZATION     Nov 2009-drug eluding stent to mid LAD   HERNIA REPAIR     inguinal   IR KYPHO EA ADDL LEVEL THORACIC OR LUMBAR  04/28/2023   IR KYPHO LUMBAR INC FX REDUCE BONE BX UNI/BIL CANNULATION INC/IMAGING  04/28/2023   PROSTATE BIOPSY  09/25/2008   gleason 4+3=7   PROSTATE CRYOABLATION  05/05/2009   Dr Patsi Sears   RIGHT/LEFT HEART CATH AND CORONARY ANGIOGRAPHY N/A 11/02/2023  Procedure: RIGHT/LEFT HEART CATH AND CORONARY ANGIOGRAPHY;  Surgeon: [place], Thermon Zulauf M, MD;  Location: [facility] INVASIVE CV LAB;  Service: Cardiovascular;  Laterality: N/A;    Social History   Tobacco Use  Smoking Status Never  Smokeless Tobacco Never    Social History   Substance and Sexual Activity  Alcohol Use No    Family History  Problem Relation Age of Onset   Heart disease Mother    Heart failure Mother    Heart disease Brother    Hematuria Father    Cancer Father        prostate   Heart disease Sister     Review of Systems: As noted in history of present illness.  All other systems were reviewed  and are negative.  Physical Exam: BP (!) 160/80 (BP Location: Left Arm, Patient Position: Lying right side, Cuff Size: Normal)   Pulse 72   Ht 5\' 7"  (1.702 m)   Wt 154 lb (69.9 kg)   SpO2 97%   BMI 24.12 kg/m  GENERAL:  Well appearing elderly WM in NAD HEENT:  PERRL, EOMI, sclera are clear. Oropharynx is clear. NECK:  JVD to 6 cm. carotid upstroke brisk and symmetric, no bruits, no thyromegaly or adenopathy LUNGS:  Clear to auscultation bilaterally CHEST:  Unremarkable HEART:  IRRR,  PMI not displaced or sustained,S1 and S2 within normal limits, no S3, no S4: no clicks, no rubs, no murmurs ABD:  Soft, nontender. BS +, no masses or bruits. No hepatomegaly, no splenomegaly EXT:  2 + pulses throughout, tr edema, no cyanosis no clubbing SKIN:  Warm and dry.  No rashes NEURO:  Alert and oriented x 3. Cranial nerves II through XII intact. PSYCH:  Cognitively intact   LABORATORY DATA: Lab Results  Component Value Date   WBC 6.6 10/26/2023   HGB 14.6 11/02/2023   HGB 14.6 11/02/2023   HCT 43.0 11/02/2023   HCT 43.0 11/02/2023   PLT 170 10/26/2023   GLUCOSE 106 (H) 10/26/2023   CHOL 137 08/29/2018   TRIG 57 08/29/2018   HDL 74 08/29/2018   LDLCALC 52 08/29/2018   ALT 13 08/29/2018   AST 24 08/29/2018   NA 139 11/02/2023   NA 142 11/02/2023   K 2.9 (L) 11/02/2023   K 3.1 (L) 11/02/2023   CL 105 10/26/2023   CREATININE 0.92 10/26/2023   BUN 14 10/26/2023   CO2 27 10/26/2023   TSH 2.02 11/14/2013   INR 1.1 10/26/2023   Labs dated 10/04/19: cholesterol 145, triglycerides 75, HDL 68, LDL 61. CBC and chemistries normal Dated 10/21/20: cholesterol 141, triglycerides 64, HDL 71, LDL 56. CBC normal Dated 11/17/20: potassium 5.4 otherwise CMET normal. Dated 11/03/21: cholesterol 139, triglycerides 64, HDL 71, LDL 55. CMET and CBC normal. Dated 11/10/22: cholesterol 145, triglycerides 63, HDL 71, LDL 61. CBC and CMET normal Dated 09/12/23: potassium 5.7 otherwise BMET normal       Echo 09/16/23: IMPRESSIONS     1. Left ventricular ejection fraction, by estimation, is 35 to 40%. The  left ventricle has moderately decreased function. The left ventricle  demonstrates regional wall motion abnormalities (see scoring  diagram/findings for description). The left  ventricular internal cavity size was moderately dilated. Left ventricular  diastolic function could not be evaluated. There is disproportionately  severe hypokinesis in the apex and mid-apical anterior and septal wlls.   2. Right ventricular systolic function is moderately reduced. The right  ventricular size is mildly enlarged. There is moderately elevated  pulmonary artery systolic pressure. The estimated right ventricular  systolic pressure is 53.9 mmHg.   3. Left atrial size was severely dilated.   4. Right atrial size was severely dilated.   5. The mitral valve is normal in structure. Moderate mitral valve  regurgitation. No evidence of mitral stenosis.   6. The aortic valve is tricuspid. There is mild calcification of the  aortic valve. There is mild thickening of the aortic valve. Aortic valve  regurgitation is not visualized. Aortic valve sclerosis/calcification is  present, without any evidence of  aortic stenosis.   7. Aortic dilatation noted. There is borderline dilatation of the aortic  root, measuring 38 mm. There is borderline dilatation of the ascending  aorta, measuring 38 mm.   8. The inferior vena cava is dilated in size with <50% respiratory  variability, suggesting right atrial pressure of 15 mmHg.   Comparison(s): Prior images unable to be directly viewed, comparison made  by report only.   PET CT 10/12/23:   There is a medium size, severe, fixed defect present in the apical septum and apex consistent with prior LAD infarction. There is a largely fixed, medium size, severe defect present in the basal to mid lateral wall consistent with infarction in the LCX territory. LVEF is moderately  reduced and increases slightly with stress (32%->35%). MBF is not accurate due to prior revascularization. The findings are consistent with the patient's known LAD infarction, but the lateral infarct appears new. There is no ischemia on this study. High-risk study based on new infarction and moderately reduced LVEF. Findings suggest ischemic cardiomyopathy.   LV perfusion is abnormal. There is no evidence of ischemia. There is evidence of infarction. Defect 1: There is a medium defect with severe reduction in uptake present in the apical to mid septal and apex location(s) that is fixed. There is abnormal wall motion in the defect area. Consistent with infarction. Defect 2: There is a medium defect with severe reduction in uptake present in the mid to basal lateral location(s) that is fixed. There is abnormal wall motion in the defect area. Consistent with infarction.   Rest left ventricular function is abnormal. Rest global function is moderately reduced. There were multiple regional abnormalities. Rest EF: 32%. Stress left ventricular function is abnormal. Stress global function is moderately reduced. There were multiple regional abnormalities. Stress EF: 34%. End diastolic cavity size is mildly enlarged.   Myocardial blood flow reserve is not reported in this patient due to technical or patient-specific concerns that affect accuracy.   Coronary calcium assessment not performed due to prior revascularization.   Findings are consistent with infarction. The study is high risk.   Electronically signed by Lennie Odor, MD ____________________________________________  Cardiac cath 11/02/23:  RIGHT/LEFT HEART CATH AND CORONARY ANGIOGRAPHY   Conclusion  Single vessel obstructive disease involving the PDA. This is not significantly changed from 2009. Widely patent LAD stent. Otherwise nonobstructive disease Normal LV filling pressures. LVEDP 11 mm Hg. PCWP 13/14 with mean 14 mm Hg Mild pulmonary HTN. PAP  43/21 mean 26 mm Hg. RA pressure normal 6 mm Hg Cardiac output 4.2 L/min, index 2.23.    Plan: recommend optimizing CHF therapy.    Diagnostic Dominance: Right  Intervention   Assessment / Plan: 1. Coronary disease with remote anterior myocardial infarction 2009 treated with drug-eluting stent to the LAD. Recent cardiac cath showed patent LAD stent. No obstructive disease in LCx or ramus. Chronic obstruction in small PDA. Continue medical therapy. No angina.  2.  Hypertension,   3. Hypercholesterolemia, on Zocor. Excellent control. At goal  LDL 61.  4. Chronic combined systolic/diastolic CHF. Left ventricular dysfunction. Ejection fraction has decreased to 35%. BNP elevated. Cardiac cath showed normal LV filling pressures and cardiac output. Currently on metoprolol and valsartan. Plan to add aldactone 12.5 daily. Check BMET in 2 weeks. Will follow up in Jan and plan on switching valsartan to [facility] in the new year. Could consider Jardiance but cost is a concern with Entresto and Xarelto as well.   5. Atrial fibrillation. Rate is controlled on low-dose metoprolol. Patient is  asymptomatic. [place] score is 3. Continue Xarelto 20 mg daily.  6. Vertebral compression fracture. S/p vertebroplasty.

## 2023-11-17 ENCOUNTER — Ambulatory Visit: Payer: Medicare Other | Attending: Cardiology | Admitting: Cardiology

## 2023-11-17 ENCOUNTER — Encounter: Payer: Self-pay | Admitting: Cardiology

## 2023-11-17 VITALS — BP 160/80 | HR 72 | Ht 67.0 in | Wt 154.0 lb

## 2023-11-17 DIAGNOSIS — I2510 Atherosclerotic heart disease of native coronary artery without angina pectoris: Secondary | ICD-10-CM | POA: Insufficient documentation

## 2023-11-17 DIAGNOSIS — Z9861 Coronary angioplasty status: Secondary | ICD-10-CM | POA: Insufficient documentation

## 2023-11-17 DIAGNOSIS — I5042 Chronic combined systolic (congestive) and diastolic (congestive) heart failure: Secondary | ICD-10-CM | POA: Diagnosis present

## 2023-11-17 DIAGNOSIS — I482 Chronic atrial fibrillation, unspecified: Secondary | ICD-10-CM | POA: Insufficient documentation

## 2023-11-17 DIAGNOSIS — E785 Hyperlipidemia, unspecified: Secondary | ICD-10-CM | POA: Insufficient documentation

## 2023-11-17 MED ORDER — SPIRONOLACTONE 25 MG PO TABS: 12.5000 mg | ORAL_TABLET | Freq: Every day | ORAL | 3 refills | Status: DC

## 2023-11-17 NOTE — Patient Instructions (Signed)
Medication Instructions:  Start Aldactone 25 mg take 1/2 tablet ( 12.5 mg ) daily Continue all other medications *If you need a refill on your cardiac medications before your next appointment, please call your pharmacy*   Lab Work: Bmet in 2 weeks  Thurs 12/5   Testing/Procedures: None ordered   Follow-Up: At [facility], you and your health needs are our priority.  As part of our continuing mission to provide you with exceptional heart care, we have created designated Provider Care Teams.  These Care Teams include your primary Cardiologist (physician) and Advanced Practice Providers (APPs -  Physician Assistants and Nurse Practitioners) who all work together to provide you with the care you need, when you need it.  We recommend signing up for the patient portal called "MyChart".  Sign up information is provided on this After Visit Summary.  MyChart is used to connect with patients for Virtual Visits (Telemedicine).  Patients are able to view lab/test results, encounter notes, upcoming appointments, etc.  Non-urgent messages can be sent to your provider as well.   To learn more about what you can do with MyChart, go to [email or web address].    Your next appointment:  Monday 01/02/2024 at 10:00 am    Provider:  Dr.Jordan

## 2023-12-02 LAB — BASIC METABOLIC PANEL
BUN/Creatinine Ratio: 14 (ref 10–24)
BUN: 14 mg/dL (ref 8–27)
CO2: 23 mmol/L (ref 20–29)
Calcium: 10.1 mg/dL (ref 8.6–10.2)
Chloride: 107 mmol/L — ABNORMAL HIGH (ref 96–106)
Creatinine, Ser: 1 mg/dL (ref 0.76–1.27)
Glucose: 136 mg/dL — ABNORMAL HIGH (ref 70–99)
Potassium: 5.2 mmol/L (ref 3.5–5.2)
Sodium: 147 mmol/L — ABNORMAL HIGH (ref 134–144)
eGFR: 72 mL/min/{1.73_m2} (ref >59)

## 2023-12-05 ENCOUNTER — Other Ambulatory Visit: Payer: Self-pay

## 2023-12-05 DIAGNOSIS — I10 Essential (primary) hypertension: Secondary | ICD-10-CM

## 2023-12-05 DIAGNOSIS — I5042 Chronic combined systolic (congestive) and diastolic (congestive) heart failure: Secondary | ICD-10-CM

## 2023-12-26 NOTE — Progress Notes (Signed)
 [name] Date of Birth: 28-Apr-1933   History of Present Illness: [name] is seen today for followup AFib and CAD. He has a history of an anterior myocardial infarction in November 2009. This was treated with a drug-eluting stent to the mid LAD.  He had a stress Myoview  study November 2012. He is able to walk for 8 minutes. He had no clinical symptoms. Images showed a fixed anterior septal and apical defect. There was no ischemia. Ejection fraction was 44%.   In November 2014 he was found to be in atrial fibrillation with a controlled response. He was started on Xarelto . Echo showed EF 40-45% with moderate biatrial enlargement. Treated with rate control and anticoagulation.   He was recently seen in the ED on 09/05/23 and treated for community acquired PNA. Troponins normal. Ecg without acute change. Afib controlled. BNP was elevated 449. This was repeated on 9/17 and was 466. No old values for comparison. Echo done showing EF 35-40%. Moderate pulmonary HTN with RV systolic pressure estimated at 54 mm Hg. Severe biatrial enlargement. Moderate MR. He had potassium of 3.4 in ED. This was repleted and on repeat was 5.7 at PCP.   We performed a cardiac CT PET to evaluate further and this was interpreted as high risk. Cardiac cath showed nonobstructive disease except for a small PDA. He had mild pulmonary HTN with normal LV filling pressures.  He is doing well on follow up today. Seen with his daughter. His wife has been hospitalized with biliary obstruction from pancreatic CA and was unable to have Whipple surgery. Planning RT and chemo.   He notes his LE edema is much better with stopping amlodipine  and changing his BP meds. No complaints today. Denies any chest pain, dyspnea or palpitations.    Current Outpatient Medications on File Prior to Visit  Medication Sig Dispense Refill   calcium carbonate (OS-CAL) 600 MG TABS Take 600 mg by mouth daily with breakfast.     Cholecalciferol  (VITAMIN D3)  2000 UNITS TABS Take 2,000 Int'l Units by mouth daily.     metoprolol  tartrate (LOPRESSOR ) 50 MG tablet Take 1/2 (one-half) tablet by mouth twice daily 90 tablet 3   rivaroxaban  (XARELTO ) 20 MG TABS tablet TAKE 1 TABLET BY MOUTH DAILY WITH SUPPER 90 tablet 3   simvastatin  (ZOCOR ) 40 MG tablet TAKE 1 TABLET BY MOUTH AT BEDTIME 90 tablet 3   spironolactone  (ALDACTONE ) 25 MG tablet Take 0.5 tablets (12.5 mg total) by mouth daily. 30 tablet 3   No current facility-administered medications on file prior to visit.    No Known Allergies  Past Medical History:  Diagnosis Date   Atrial fibrillation [facility])    Coronary artery disease    Nov 2009-MI-stenting of the mid LAD drug-eluding stent   Hx of radiation therapy 05/15/14- 07/11/14   prostate 7800 cGy 40 sessions, seminal vesicles 5600 cGy 40 sessions   Hyperlipidemia    Hypertension    LV dysfunction    MI (myocardial infarction) (HCC) 1998   Osteopenia    Prostate CA (HCC) 05/05/2009   prostate cancer 11/09-tx with cryotherapy, Dr Chales    Past Surgical History:  Procedure Laterality Date   CARDIAC CATHETERIZATION     Nov 2009-drug eluding stent to mid LAD   HERNIA REPAIR     inguinal   IR KYPHO EA ADDL LEVEL THORACIC OR LUMBAR  04/28/2023   IR KYPHO LUMBAR INC FX REDUCE BONE BX UNI/BIL CANNULATION INC/IMAGING  04/28/2023   PROSTATE BIOPSY  09/25/2008   gleason 4+3=7   PROSTATE CRYOABLATION  05/05/2009   Dr Chales   RIGHT/LEFT HEART CATH AND CORONARY ANGIOGRAPHY N/A 11/02/2023   Procedure: RIGHT/LEFT HEART CATH AND CORONARY ANGIOGRAPHY;  Surgeon: Paitynn Mikus M, MD;  Location[facility] INVASIVE CV LAB;  Service: Cardiovascular;  Laterality: N/A;    Social History   Tobacco Use  Smoking Status Never  Smokeless Tobacco Never    Social History   Substance and Sexual Activity  Alcohol Use No    Family History  Problem Relation Age of Onset   Heart disease Mother    Heart failure Mother    Heart disease Brother    Hematuria  Father    Cancer Father        prostate   Heart disease Sister     Review of Systems: As noted in history of present illness.  All other systems were reviewed and are negative.  Physical Exam: BP 130/88 (BP Location: Right Arm, Patient Position: Sitting, Cuff Size: Normal)   Pulse 69   Ht 5' 7 (1.702 m)   Wt 150 lb (68 kg)   BMI 23.49 kg/m  GENERAL:  Well appearing elderly WM in NAD HEENT:  PERRL, EOMI, sclera are clear. Oropharynx is clear. NECK:  no JVD. carotid upstroke brisk and symmetric, no bruits, no thyromegaly or adenopathy LUNGS:  Clear to auscultation bilaterally CHEST:  Unremarkable HEART:  IRRR,  PMI not displaced or sustained,S1 and S2 within normal limits, no S3, no S4: no clicks, no rubs, no murmurs ABD:  Soft, nontender. BS +, no masses or bruits. No hepatomegaly, no splenomegaly EXT:  2 + pulses throughout, No edema, no cyanosis no clubbing SKIN:  Warm and dry.  No rashes NEURO:  Alert and oriented x 3. Cranial nerves II through XII intact. PSYCH:  Cognitively intact   LABORATORY DATA: Lab Results  Component Value Date   WBC 6.6 10/26/2023   HGB 14.6 11/02/2023   HGB 14.6 11/02/2023   HCT 43.0 11/02/2023   HCT 43.0 11/02/2023   PLT 170 10/26/2023   GLUCOSE 136 (H) 12/01/2023   CHOL 137 08/29/2018   TRIG 57 08/29/2018   HDL 74 08/29/2018   LDLCALC 52 08/29/2018   ALT 13 08/29/2018   AST 24 08/29/2018   NA 147 (H) 12/01/2023   K 5.2 12/01/2023   CL 107 (H) 12/01/2023   CREATININE 1.00 12/01/2023   BUN 14 12/01/2023   CO2 23 12/01/2023   TSH 2.02 11/14/2013   INR 1.1 10/26/2023   Labs dated 10/04/19: cholesterol 145, triglycerides 75, HDL 68, LDL 61. CBC and chemistries normal Dated 10/21/20: cholesterol 141, triglycerides 64, HDL 71, LDL 56. CBC normal Dated 11/17/20: potassium 5.4 otherwise CMET normal. Dated 11/03/21: cholesterol 139, triglycerides 64, HDL 71, LDL 55. CMET and CBC normal. Dated 11/10/22: cholesterol 145, triglycerides 63, HDL  71, LDL 61. CBC and CMET normal Dated 09/12/23: potassium 5.7 otherwise BMET normal      Echo 09/16/23: IMPRESSIONS     1. Left ventricular ejection fraction, by estimation, is 35 to 40%. The  left ventricle has moderately decreased function. The left ventricle  demonstrates regional wall motion abnormalities (see scoring  diagram/findings for description). The left  ventricular internal cavity size was moderately dilated. Left ventricular  diastolic function could not be evaluated. There is disproportionately  severe hypokinesis in the apex and mid-apical anterior and septal wlls.   2. Right ventricular systolic function is moderately reduced. The right  ventricular size  is mildly enlarged. There is moderately elevated  pulmonary artery systolic pressure. The estimated right ventricular  systolic pressure is 53.9 mmHg.   3. Left atrial size was severely dilated.   4. Right atrial size was severely dilated.   5. The mitral valve is normal in structure. Moderate mitral valve  regurgitation. No evidence of mitral stenosis.   6. The aortic valve is tricuspid. There is mild calcification of the  aortic valve. There is mild thickening of the aortic valve. Aortic valve  regurgitation is not visualized. Aortic valve sclerosis/calcification is  present, without any evidence of  aortic stenosis.   7. Aortic dilatation noted. There is borderline dilatation of the aortic  root, measuring 38 mm. There is borderline dilatation of the ascending  aorta, measuring 38 mm.   8. The inferior vena cava is dilated in size with <50% respiratory  variability, suggesting right atrial pressure of 15 mmHg.   Comparison(s): Prior images unable to be directly viewed, comparison made  by report only.   PET CT 10/12/23:   There is a medium size, severe, fixed defect present in the apical septum and apex consistent with prior LAD infarction. There is a largely fixed, medium size, severe defect present in the  basal to mid lateral wall consistent with infarction in the LCX territory. LVEF is moderately reduced and increases slightly with stress (32%->35%). MBF is not accurate due to prior revascularization. The findings are consistent with the patient's known LAD infarction, but the lateral infarct appears new. There is no ischemia on this study. High-risk study based on new infarction and moderately reduced LVEF. Findings suggest ischemic cardiomyopathy.   LV perfusion is abnormal. There is no evidence of ischemia. There is evidence of infarction. Defect 1: There is a medium defect with severe reduction in uptake present in the apical to mid septal and apex location(s) that is fixed. There is abnormal wall motion in the defect area. Consistent with infarction. Defect 2: There is a medium defect with severe reduction in uptake present in the mid to basal lateral location(s) that is fixed. There is abnormal wall motion in the defect area. Consistent with infarction.   Rest left ventricular function is abnormal. Rest global function is moderately reduced. There were multiple regional abnormalities. Rest EF: 32%. Stress left ventricular function is abnormal. Stress global function is moderately reduced. There were multiple regional abnormalities. Stress EF: 34%. End diastolic cavity size is mildly enlarged.   Myocardial blood flow reserve is not reported in this patient due to technical or patient-specific concerns that affect accuracy.   Coronary calcium assessment not performed due to prior revascularization.   Findings are consistent with infarction. The study is high risk.   Electronically signed by Darryle Decent, MD ____________________________________________  Cardiac cath 11/02/23:  RIGHT/LEFT HEART CATH AND CORONARY ANGIOGRAPHY   Conclusion  Single vessel obstructive disease involving the PDA. This is not significantly changed from 2009. Widely patent LAD stent. Otherwise nonobstructive disease Normal  LV filling pressures. LVEDP 11 mm Hg. PCWP 13/14 with mean 14 mm Hg Mild pulmonary HTN. PAP 43/21 mean 26 mm Hg. RA pressure normal 6 mm Hg Cardiac output 4.2 L/min, index 2.23.    Plan: recommend optimizing CHF therapy.    Diagnostic Dominance: Right  Intervention   Assessment / Plan: 1. Coronary disease with remote anterior myocardial infarction 2009 treated with drug-eluting stent to the LAD. Recent cardiac cath showed patent LAD stent. No obstructive disease in LCx or ramus. Chronic obstruction in small  PDA. Continue medical therapy. No angina.  2. Hypertension, well controlled  3. Hypercholesterolemia, on Zocor . Excellent control. At goal  LDL 61.  4. Chronic combined systolic/diastolic CHF. Left ventricular dysfunction. Ejection fraction has decreased to 35%. BNP elevated. Cardiac cath showed normal LV filling pressures and cardiac output. Currently on metoprolol  and valsartan . Added aldactone  12.5 daily on last visit. Check BMET today. Will plan on switching valsartan  to Entresto  49/51 mg bid. Could consider Jardiance  but cost is a concern with Entresto  and Xarelto  as well. Follow up in 3 months  5. Atrial fibrillation. Rate is controlled on low-dose metoprolol . Patient is  asymptomatic. [place] score is 3. Continue Xarelto  20 mg daily.  6. Vertebral compression fracture. S/p vertebroplasty.

## 2024-01-02 ENCOUNTER — Ambulatory Visit: Payer: Medicare Other | Attending: Cardiology | Admitting: Cardiology

## 2024-01-02 ENCOUNTER — Encounter: Payer: Self-pay | Admitting: Cardiology

## 2024-01-02 VITALS — BP 130/88 | HR 69 | Ht 67.0 in | Wt 150.0 lb

## 2024-01-02 DIAGNOSIS — I10 Essential (primary) hypertension: Secondary | ICD-10-CM | POA: Insufficient documentation

## 2024-01-02 DIAGNOSIS — I2510 Atherosclerotic heart disease of native coronary artery without angina pectoris: Secondary | ICD-10-CM | POA: Insufficient documentation

## 2024-01-02 DIAGNOSIS — I482 Chronic atrial fibrillation, unspecified: Secondary | ICD-10-CM | POA: Insufficient documentation

## 2024-01-02 DIAGNOSIS — Z9861 Coronary angioplasty status: Secondary | ICD-10-CM | POA: Insufficient documentation

## 2024-01-02 DIAGNOSIS — I5042 Chronic combined systolic (congestive) and diastolic (congestive) heart failure: Secondary | ICD-10-CM | POA: Insufficient documentation

## 2024-01-02 MED ORDER — SACUBITRIL-VALSARTAN 49-51 MG PO TABS: 1.0000 | ORAL_TABLET | Freq: Two times a day (BID) | ORAL | 11 refills | Status: DC

## 2024-01-02 MED ORDER — SACUBITRIL-VALSARTAN 49-51 MG PO TABS: 1.0000 | ORAL_TABLET | Freq: Two times a day (BID) | ORAL | 0 refills | Status: DC

## 2024-01-02 NOTE — Patient Instructions (Signed)
 Medication Instructions:  Stop Valsartan  Start Entresto  49/51 mg twice a day Continue all other medications *If you need a refill on your cardiac medications before your next appointment, please call your pharmacy*   Lab Work: Bmet today Bmet in 1 month Feb 6th   Testing/Procedures: None ordered   Follow-Up: At [facility], you and your health needs are our priority.  As part of our continuing mission to provide you with exceptional heart care, we have created designated Provider Care Teams.  These Care Teams include your primary Cardiologist (physician) and Advanced Practice Providers (APPs -  Physician Assistants and Nurse Practitioners) who all work together to provide you with the care you need, when you need it.  We recommend signing up for the patient portal called MyChart.  Sign up information is provided on this After Visit Summary.  MyChart is used to connect with patients for Virtual Visits (Telemedicine).  Patients are able to view lab/test results, encounter notes, upcoming appointments, etc.  Non-urgent messages can be sent to your provider as well.   To learn more about what you can do with MyChart, go to [email or web address].    Your next appointment:  3 months    Provider:  Dr.Jordan

## 2024-01-02 NOTE — Addendum Note (Signed)
 Addended by: Neoma Laming on: 01/02/2024 04:52 PM   Modules accepted: Orders

## 2024-01-03 LAB — BASIC METABOLIC PANEL
BUN/Creatinine Ratio: 14 (ref 10–24)
BUN: 13 mg/dL (ref 8–27)
CO2: 24 mmol/L (ref 20–29)
Calcium: 10.3 mg/dL — ABNORMAL HIGH (ref 8.6–10.2)
Chloride: 105 mmol/L (ref 96–106)
Creatinine, Ser: 0.96 mg/dL (ref 0.76–1.27)
Glucose: 123 mg/dL — ABNORMAL HIGH (ref 70–99)
Potassium: 5 mmol/L (ref 3.5–5.2)
Sodium: 144 mmol/L (ref 134–144)
eGFR: 76 mL/min/{1.73_m2} (ref >59)

## 2024-02-02 LAB — BASIC METABOLIC PANEL
BUN/Creatinine Ratio: 18 (ref 10–24)
BUN: 18 mg/dL (ref 8–27)
CO2: 25 mmol/L (ref 20–29)
Calcium: 9.7 mg/dL (ref 8.6–10.2)
Chloride: 106 mmol/L (ref 96–106)
Creatinine, Ser: 1.02 mg/dL (ref 0.76–1.27)
Glucose: 74 mg/dL (ref 70–99)
Potassium: 5.1 mmol/L (ref 3.5–5.2)
Sodium: 144 mmol/L (ref 134–144)
eGFR: 71 mL/min/{1.73_m2} (ref >59)

## 2024-02-16 ENCOUNTER — Other Ambulatory Visit: Payer: Self-pay | Admitting: Cardiology

## 2024-04-01 NOTE — Progress Notes (Unsigned)
 [name] Date of Birth: Aug 22, 1933   History of Present Illness: [name] is seen today for followup AFib and CAD. He has a history of an anterior myocardial infarction in November 2009. This was treated with a drug-eluting stent to the mid LAD.  He had a stress Myoview study November 2012. He is able to walk for 8 minutes. He had no clinical symptoms. Images showed a fixed anterior septal and apical defect. There was no ischemia. Ejection fraction was 44%.   In November 2014 he was found to be in atrial fibrillation with a controlled response. He was started on Xarelto. Echo showed EF 40-45% with moderate biatrial enlargement. Treated with rate control and anticoagulation.   He was recently seen in the ED on 09/05/23 and treated for community acquired PNA. Troponins normal. Ecg without acute change. Afib controlled. BNP was elevated 449. This was repeated on 9/17 and was 466. No old values for comparison. Echo done showing EF 35-40%. Moderate pulmonary HTN with RV systolic pressure estimated at 54 mm Hg. Severe biatrial enlargement. Moderate MR. He had potassium of 3.4 in ED. This was repleted and on repeat was 5.7 at PCP.   We performed a cardiac CT PET to evaluate further and this was interpreted as high risk. Cardiac cath showed nonobstructive disease except for a small PDA. He had mild pulmonary HTN with normal LV filling pressures.  He is doing well on follow up today. Seen with his son. His wife recently passed away with pancreatic CA. He notes he is still out of breath and tires at the end of the day. No chest pain. No edema. Weight is stable.     Current Outpatient Medications on File Prior to Visit  Medication Sig Dispense Refill   calcium carbonate (OS-CAL) 600 MG TABS Take 600 mg by mouth daily with breakfast.     Cholecalciferol (VITAMIN D3) 2000 UNITS TABS Take 2,000 Int'l Units by mouth daily.     methocarbamol (ROBAXIN) 500 MG tablet Take 500-1,000 mg by mouth every 6 (six)  hours as needed.     rivaroxaban (XARELTO) 20 MG TABS tablet TAKE 1 TABLET BY MOUTH DAILY WITH SUPPER 90 tablet 3   simvastatin (ZOCOR) 40 MG tablet TAKE 1 TABLET BY MOUTH AT BEDTIME 90 tablet 3   No current facility-administered medications on file prior to visit.    No Known Allergies  Past Medical History:  Diagnosis Date   Atrial fibrillation [facility])    Coronary artery disease    Nov 2009-MI-stenting of the mid LAD drug-eluding stent   Hx of radiation therapy 05/15/14- 07/11/14   prostate 7800 cGy 40 sessions, seminal vesicles 5600 cGy 40 sessions   Hyperlipidemia    Hypertension    LV dysfunction    MI (myocardial infarction) (HCC) 1998   Osteopenia    Prostate CA (HCC) 05/05/2009   prostate cancer 11/09-tx with cryotherapy, Dr Patsi Sears    Past Surgical History:  Procedure Laterality Date   CARDIAC CATHETERIZATION     Nov 2009-drug eluding stent to mid LAD   HERNIA REPAIR     inguinal   IR KYPHO EA ADDL LEVEL THORACIC OR LUMBAR  04/28/2023   IR KYPHO LUMBAR INC FX REDUCE BONE BX UNI/BIL CANNULATION INC/IMAGING  04/28/2023   PROSTATE BIOPSY  09/25/2008   gleason 4+3=7   PROSTATE CRYOABLATION  05/05/2009   Dr Patsi Sears   RIGHT/LEFT HEART CATH AND CORONARY ANGIOGRAPHY N/A 11/02/2023   Procedure: RIGHT/LEFT HEART CATH AND CORONARY ANGIOGRAPHY;  Surgeon: [place], Masae Lukacs M, MD;  Location: [facility] INVASIVE CV LAB;  Service: Cardiovascular;  Laterality: N/A;    Social History   Tobacco Use  Smoking Status Never  Smokeless Tobacco Never    Social History   Substance and Sexual Activity  Alcohol Use No    Family History  Problem Relation Age of Onset   Heart disease Mother    Heart failure Mother    Heart disease Brother    Hematuria Father    Cancer Father        prostate   Heart disease Sister     Review of Systems: As noted in history of present illness.  All other systems were reviewed and are negative.  Physical Exam: BP 138/72 (BP Location: Right Arm, Patient  Position: Sitting)   Pulse 71   Wt 151 lb 6.4 oz (68.7 kg)   SpO2 97%   BMI 23.71 kg/m  GENERAL:  Well appearing elderly WM in NAD HEENT:  PERRL, EOMI, sclera are clear. Oropharynx is clear. NECK:  no JVD. carotid upstroke brisk and symmetric, no bruits, no thyromegaly or adenopathy LUNGS:  Clear to auscultation bilaterally CHEST:  Unremarkable HEART:  IRRR,  PMI not displaced or sustained,S1 and S2 within normal limits, no S3, no S4: no clicks, no rubs, no murmurs ABD:  Soft, nontender. BS +, no masses or bruits. No hepatomegaly, no splenomegaly EXT:  2 + pulses throughout, No edema, no cyanosis no clubbing SKIN:  Warm and dry.  No rashes NEURO:  Alert and oriented x 3. Cranial nerves II through XII intact. PSYCH:  Cognitively intact   LABORATORY DATA: Lab Results  Component Value Date   WBC 6.6 10/26/2023   HGB 14.6 11/02/2023   HGB 14.6 11/02/2023   HCT 43.0 11/02/2023   HCT 43.0 11/02/2023   PLT 170 10/26/2023   GLUCOSE 74 02/02/2024   CHOL 137 08/29/2018   TRIG 57 08/29/2018   HDL 74 08/29/2018   LDLCALC 52 08/29/2018   ALT 13 08/29/2018   AST 24 08/29/2018   NA 144 02/02/2024   K 5.1 02/02/2024   CL 106 02/02/2024   CREATININE 1.02 02/02/2024   BUN 18 02/02/2024   CO2 25 02/02/2024   TSH 2.02 11/14/2013   INR 1.1 10/26/2023   Labs dated 10/04/19: cholesterol 145, triglycerides 75, HDL 68, LDL 61. CBC and chemistries normal Dated 10/21/20: cholesterol 141, triglycerides 64, HDL 71, LDL 56. CBC normal Dated 11/17/20: potassium 5.4 otherwise CMET normal. Dated 11/03/21: cholesterol 139, triglycerides 64, HDL 71, LDL 55. CMET and CBC normal. Dated 11/10/22: cholesterol 145, triglycerides 63, HDL 71, LDL 61. CBC and CMET normal Dated 09/12/23: potassium 5.7 otherwise BMET normal      Echo 09/16/23: IMPRESSIONS     1. Left ventricular ejection fraction, by estimation, is 35 to 40%. The  left ventricle has moderately decreased function. The left ventricle   demonstrates regional wall motion abnormalities (see scoring  diagram/findings for description). The left  ventricular internal cavity size was moderately dilated. Left ventricular  diastolic function could not be evaluated. There is disproportionately  severe hypokinesis in the apex and mid-apical anterior and septal wlls.   2. Right ventricular systolic function is moderately reduced. The right  ventricular size is mildly enlarged. There is moderately elevated  pulmonary artery systolic pressure. The estimated right ventricular  systolic pressure is 53.9 mmHg.   3. Left atrial size was severely dilated.   4. Right atrial size was severely dilated.  5. The mitral valve is normal in structure. Moderate mitral valve  regurgitation. No evidence of mitral stenosis.   6. The aortic valve is tricuspid. There is mild calcification of the  aortic valve. There is mild thickening of the aortic valve. Aortic valve  regurgitation is not visualized. Aortic valve sclerosis/calcification is  present, without any evidence of  aortic stenosis.   7. Aortic dilatation noted. There is borderline dilatation of the aortic  root, measuring 38 mm. There is borderline dilatation of the ascending  aorta, measuring 38 mm.   8. The inferior vena cava is dilated in size with <50% respiratory  variability, suggesting right atrial pressure of 15 mmHg.   Comparison(s): Prior images unable to be directly viewed, comparison made  by report only.   PET CT 10/12/23:   There is a medium size, severe, fixed defect present in the apical septum and apex consistent with prior LAD infarction. There is a largely fixed, medium size, severe defect present in the basal to mid lateral wall consistent with infarction in the LCX territory. LVEF is moderately reduced and increases slightly with stress (32%->35%). MBF is not accurate due to prior revascularization. The findings are consistent with the patient's known LAD infarction,  but the lateral infarct appears new. There is no ischemia on this study. High-risk study based on new infarction and moderately reduced LVEF. Findings suggest ischemic cardiomyopathy.   LV perfusion is abnormal. There is no evidence of ischemia. There is evidence of infarction. Defect 1: There is a medium defect with severe reduction in uptake present in the apical to mid septal and apex location(s) that is fixed. There is abnormal wall motion in the defect area. Consistent with infarction. Defect 2: There is a medium defect with severe reduction in uptake present in the mid to basal lateral location(s) that is fixed. There is abnormal wall motion in the defect area. Consistent with infarction.   Rest left ventricular function is abnormal. Rest global function is moderately reduced. There were multiple regional abnormalities. Rest EF: 32%. Stress left ventricular function is abnormal. Stress global function is moderately reduced. There were multiple regional abnormalities. Stress EF: 34%. End diastolic cavity size is mildly enlarged.   Myocardial blood flow reserve is not reported in this patient due to technical or patient-specific concerns that affect accuracy.   Coronary calcium assessment not performed due to prior revascularization.   Findings are consistent with infarction. The study is high risk.   Electronically signed by Lennie Odor, MD ____________________________________________  Cardiac cath 11/02/23:  RIGHT/LEFT HEART CATH AND CORONARY ANGIOGRAPHY   Conclusion  Single vessel obstructive disease involving the PDA. This is not significantly changed from 2009. Widely patent LAD stent. Otherwise nonobstructive disease Normal LV filling pressures. LVEDP 11 mm Hg. PCWP 13/14 with mean 14 mm Hg Mild pulmonary HTN. PAP 43/21 mean 26 mm Hg. RA pressure normal 6 mm Hg Cardiac output 4.2 L/min, index 2.23.    Plan: recommend optimizing CHF therapy.    Diagnostic Dominance:  Right  Intervention   Assessment / Plan: 1. Coronary disease with remote anterior myocardial infarction 2009 treated with drug-eluting stent to the LAD. Recent cardiac cath showed patent LAD stent. No obstructive disease in LCx or ramus. Chronic obstruction in small PDA. Continue medical therapy. No angina.  2. Hypertension, well controlled  3. Hypercholesterolemia, on Zocor. Excellent control. At goal  LDL 61.  4. Chronic combined systolic/diastolic CHF. Left ventricular dysfunction. Ejection fraction has decreased to 35%. BNP elevated. Cardiac cath  showed normal LV filling pressures and cardiac output. Currently on metoprolol, Entresto, aldactone. No evidence of volume overload. Will increase Entresto to 97/103 mg bid. Add Farxiga 10 mg daily.  Will repeat Echo prior to next visit in 4 months.    5. Atrial fibrillation. Rate is controlled on low-dose metoprolol. Patient is  asymptomatic. [place] score is 3. Continue Xarelto 20 mg daily.  6. Vertebral compression fracture. S/p vertebroplasty.

## 2024-04-04 ENCOUNTER — Encounter: Payer: Self-pay | Admitting: Cardiology

## 2024-04-04 ENCOUNTER — Ambulatory Visit: Payer: Medicare Other | Attending: Cardiology | Admitting: Cardiology

## 2024-04-04 VITALS — BP 138/72 | HR 71 | Wt 151.4 lb

## 2024-04-04 DIAGNOSIS — Z9861 Coronary angioplasty status: Secondary | ICD-10-CM | POA: Insufficient documentation

## 2024-04-04 DIAGNOSIS — I10 Essential (primary) hypertension: Secondary | ICD-10-CM | POA: Insufficient documentation

## 2024-04-04 DIAGNOSIS — I5042 Chronic combined systolic (congestive) and diastolic (congestive) heart failure: Secondary | ICD-10-CM | POA: Insufficient documentation

## 2024-04-04 DIAGNOSIS — I2510 Atherosclerotic heart disease of native coronary artery without angina pectoris: Secondary | ICD-10-CM | POA: Diagnosis not present

## 2024-04-04 DIAGNOSIS — I482 Chronic atrial fibrillation, unspecified: Secondary | ICD-10-CM | POA: Diagnosis not present

## 2024-04-04 MED ORDER — METHOCARBAMOL 500 MG PO TABS: 500.0000 mg | ORAL_TABLET | Freq: Four times a day (QID) | ORAL | 1 refills | Status: DC | PRN

## 2024-04-04 MED ORDER — METOPROLOL TARTRATE 50 MG PO TABS: 25.0000 mg | ORAL_TABLET | Freq: Two times a day (BID) | ORAL | 3 refills | Status: DC

## 2024-04-04 MED ORDER — SPIRONOLACTONE 25 MG PO TABS: 12.5000 mg | ORAL_TABLET | Freq: Every day | ORAL | 3 refills | Status: AC

## 2024-04-04 MED ORDER — DAPAGLIFLOZIN PROPANEDIOL 5 MG PO TABS: 10.0000 mg | ORAL_TABLET | Freq: Every day | ORAL | 3 refills | Status: DC

## 2024-04-04 MED ORDER — SACUBITRIL-VALSARTAN 97-103 MG PO TABS: 1.0000 | ORAL_TABLET | Freq: Two times a day (BID) | ORAL | 3 refills | Status: AC

## 2024-04-04 NOTE — Patient Instructions (Addendum)
 Medication Instructions:  Increase Entresto to 97/103 mg twice a day Start Farxiga 10 mg daily Continue all other medication *If you need a refill on your cardiac medications before your next appointment, please call your pharmacy*  Lab Work: None ordered   Testing/Procedures: Echo in 4 months before appointment   Follow-Up: At [facility], you and your health needs are our priority.  As part of our continuing mission to provide you with exceptional heart care, our providers are all part of one team.  This team includes your primary Cardiologist (physician) and Advanced Practice Providers or APPs (Physician Assistants and Nurse Practitioners) who all work together to provide you with the care you need, when you need it.  Your next appointment:  4 months    Provider:  Dr.Jordan   We recommend signing up for the patient portal called "MyChart".  Sign up information is provided on this After Visit Summary.  MyChart is used to connect with patients for Virtual Visits (Telemedicine).  Patients are able to view lab/test results, encounter notes, upcoming appointments, etc.  Non-urgent messages can be sent to your provider as well.   To learn more about what you can do with MyChart, go to [email or web address].         1st Floor: - Lobby - Registration  - Pharmacy  - Lab - Cafe  2nd Floor: - PV Lab - Diagnostic Testing (echo, CT, nuclear med)  3rd Floor: - Vacant  4th Floor: - TCTS (cardiothoracic surgery) - AFib Clinic - Structural Heart Clinic - Vascular Surgery  - Vascular Ultrasound  5th Floor: - HeartCare Cardiology (general and EP) - Clinical Pharmacy for coumadin, hypertension, lipid, weight-loss medications, and med management appointments    Valet parking services will be available as well.

## 2024-04-04 NOTE — Addendum Note (Signed)
 Addended by: Neoma Laming on: 04/04/2024 12:24 PM   Modules accepted: Orders

## 2024-04-05 MED ORDER — DAPAGLIFLOZIN PROPANEDIOL 5 MG PO TABS: 10.0000 mg | ORAL_TABLET | Freq: Every day | ORAL | 3 refills | Status: DC

## 2024-04-05 NOTE — Addendum Note (Signed)
 Addended by: Neoma Laming on: 04/05/2024 09:12 AM   Modules accepted: Orders

## 2024-05-02 ENCOUNTER — Telehealth: Payer: Self-pay | Admitting: Cardiology

## 2024-05-02 MED ORDER — DAPAGLIFLOZIN PROPANEDIOL 10 MG PO TABS: 10.0000 mg | ORAL_TABLET | Freq: Every day | ORAL | 3 refills | Status: DC

## 2024-05-02 NOTE — Telephone Encounter (Signed)
 Spoke with patient, made aware its cheaper for the insurance company to take 1 (10mg ) tablet daily instead of the 2 (5mg ) tablets daily. Sent in updated prescription to [facility] at patient request. No further needs at this time

## 2024-05-02 NOTE — Telephone Encounter (Signed)
 Pt c/o medication issue:  1. Name of Medication: dapagliflozin  propanediol (FARXIGA ) 5 MG TABS tablet   2. How are you currently taking this medication (dosage and times per day)?   Take 2 tablets (10 mg total) by mouth daily.    3. Are you having a reaction (difficulty breathing--STAT)? no  4. What is your medication issue? Calling to see if patient meds can be written for 10mg  (1 pill) instead of 5mg  (2 pills). Stated it would be cheaper for the patient. Please advise  .

## 2024-05-15 ENCOUNTER — Telehealth: Payer: Self-pay | Admitting: Cardiology

## 2024-05-15 NOTE — Telephone Encounter (Signed)
 Pt c/o Shortness Of Breath: STAT if SOB developed within the last 24 hours or pt is noticeably SOB on the phone  1. Are you currently SOB (can you hear that pt is SOB on the phone)? no  2. How long have you been experiencing SOB? Stated that it has gotten worse over the last 2/3 weeks.                                                                              3. Are you SOB when sitting or when up moving around? Moving around   4. Are you currently experiencing any other symptoms? Daughter is calling in to get patient seen sooner by Dr. [place], states the patient doesn't no want to see a PA.

## 2024-05-15 NOTE — Telephone Encounter (Signed)
 Returned call from dtr Glenora Laos [facility]) regarding patient with increasing SOB on exertion for the last 2-3 weeks. Patient was last seen by Dr. [place] on 04/04/2024 and had SOB at that time. Glenora Laos states it has progressed to where he gets SOB with just getting dressed at times. She states 3 days ago he became fatigued, weak and lightheaded while walking in the morning and spent the rest of the day in bed. She does not have a recent weight, BP or HR on patient and denies that he uses home O2. She denies chest pain or leg swelling.  Offered appt with Dr. Berry Bristol (DOD) on 05/18/24, Glenora Laos accepts appt. Advised that if SOB continues to worsen and does not improve with rest after activity she needs to call 911 or go to ED. Glenora Laos verbalizes understanding.

## 2024-05-17 NOTE — Telephone Encounter (Signed)
 Called patient's daughter Glenora Laos left message on personal voice mail to call back.

## 2024-05-17 NOTE — Telephone Encounter (Signed)
 Spoke to patient's daughter she is concerned about father.He had appointment with D[facility] 5/23 but she had to cancel he refused to go.He is more sob for the past 3 weeks.Stated when he puts his clothes on in mornings he has to sit down he is so sob.No swelling.No chest pain.Stated he lost his balance and fell in kitchen this week.He also has been having pain in lower back and right hip.Advised Dr.Jordan has had a cancellation.Appointment scheduled with Dr.Jordan 5/27 at 10:20 am.

## 2024-05-18 ENCOUNTER — Ambulatory Visit: Admitting: Cardiology

## 2024-05-22 ENCOUNTER — Other Ambulatory Visit: Payer: Self-pay

## 2024-05-22 ENCOUNTER — Encounter: Payer: Self-pay | Admitting: Cardiology

## 2024-05-22 ENCOUNTER — Ambulatory Visit: Attending: Cardiology | Admitting: Cardiology

## 2024-05-22 VITALS — BP 122/72 | HR 72 | Ht 67.0 in | Wt 143.8 lb

## 2024-05-22 DIAGNOSIS — R0609 Other forms of dyspnea: Secondary | ICD-10-CM | POA: Diagnosis present

## 2024-05-22 DIAGNOSIS — I2510 Atherosclerotic heart disease of native coronary artery without angina pectoris: Secondary | ICD-10-CM | POA: Diagnosis present

## 2024-05-22 DIAGNOSIS — I482 Chronic atrial fibrillation, unspecified: Secondary | ICD-10-CM | POA: Insufficient documentation

## 2024-05-22 DIAGNOSIS — I5042 Chronic combined systolic (congestive) and diastolic (congestive) heart failure: Secondary | ICD-10-CM | POA: Diagnosis present

## 2024-05-22 DIAGNOSIS — Z9861 Coronary angioplasty status: Secondary | ICD-10-CM | POA: Diagnosis present

## 2024-05-22 DIAGNOSIS — R0602 Shortness of breath: Secondary | ICD-10-CM | POA: Insufficient documentation

## 2024-05-22 LAB — LIPID PANEL

## 2024-05-22 MED ORDER — METHOCARBAMOL 500 MG PO TABS: 500.0000 mg | ORAL_TABLET | Freq: Four times a day (QID) | ORAL | 2 refills | Status: AC | PRN

## 2024-05-22 NOTE — Progress Notes (Signed)
 [name] Date of Birth: 19-Apr-1933   History of Present Illness: [name] is seen today for followup CHF and increased edema.  He has a history of an anterior myocardial infarction in November 2009. This was treated with a drug-eluting stent to the mid LAD.  He had a stress Myoview  study November 2012. He is able to walk for 8 minutes. He had no clinical symptoms. Images showed a fixed anterior septal and apical defect. There was no ischemia. Ejection fraction was 44%.   In November 2014 he was found to be in atrial fibrillation with a controlled response. He was started on Xarelto . Echo showed EF 40-45% with moderate biatrial enlargement. Treated with rate control and anticoagulation.   He was recently seen in the ED on 09/05/23 and treated for community acquired PNA. Troponins normal. Ecg without acute change. Afib controlled. BNP was elevated 449. This was repeated on 9/17 and was 466. No old values for comparison. Echo done showing EF 35-40%. Moderate pulmonary HTN with RV systolic pressure estimated at 54 mm Hg. Severe biatrial enlargement. Moderate MR. He had potassium of 3.4 in ED. This was repleted and on repeat was 5.7 at PCP.   We performed a cardiac CT PET to evaluate further and this was interpreted as high risk. Cardiac cath showed nonobstructive disease except for a small PDA. He had mild pulmonary HTN with normal LV filling pressures.  Seen with his son today. Notes symptoms of more fatigue and SOB walking across the house. No chest pain or edema. Weight is actually down 8 lbs. Notes back pain that has been keeping him up at night. Taking Robaxin  for this. Low energy.    Current Outpatient Medications on File Prior to Visit  Medication Sig Dispense Refill   calcium carbonate (OS-CAL) 600 MG TABS Take 600 mg by mouth daily with breakfast.     Cholecalciferol (VITAMIN D3) 2000 UNITS TABS Take 2,000 Int'l Units by mouth daily.     dapagliflozin  propanediol (FARXIGA ) 10 MG TABS  tablet Take 1 tablet (10 mg total) by mouth daily before breakfast. 90 tablet 3   methocarbamol  (ROBAXIN ) 500 MG tablet Take 1-2 tablets (500-1,000 mg total) by mouth every 6 (six) hours as needed. 60 tablet 1   rivaroxaban  (XARELTO ) 20 MG TABS tablet TAKE 1 TABLET BY MOUTH DAILY WITH SUPPER 90 tablet 3   sacubitril -valsartan  (ENTRESTO ) 97-103 MG Take 1 tablet by mouth 2 (two) times daily. 180 tablet 3   simvastatin  (ZOCOR ) 40 MG tablet TAKE 1 TABLET BY MOUTH AT BEDTIME 90 tablet 3   spironolactone  (ALDACTONE ) 25 MG tablet Take 0.5 tablets (12.5 mg total) by mouth daily. 45 tablet 3   No current facility-administered medications on file prior to visit.    No Known Allergies  Past Medical History:  Diagnosis Date   Atrial fibrillation [facility])    Coronary artery disease    Nov 2009-MI-stenting of the mid LAD drug-eluding stent   Hx of radiation therapy 05/15/14- 07/11/14   prostate 7800 cGy 40 sessions, seminal vesicles 5600 cGy 40 sessions   Hyperlipidemia    Hypertension    LV dysfunction    MI (myocardial infarction) (HCC) 1998   Osteopenia    Prostate CA (HCC) 05/05/2009   prostate cancer 11/09-tx with cryotherapy, Dr Isla Mari    Past Surgical History:  Procedure Laterality Date   CARDIAC CATHETERIZATION     Nov 2009-drug eluding stent to mid LAD   HERNIA REPAIR     inguinal  IR KYPHO EA ADDL LEVEL THORACIC OR LUMBAR  04/28/2023   IR KYPHO LUMBAR INC FX REDUCE BONE BX UNI/BIL CANNULATION INC/IMAGING  04/28/2023   PROSTATE BIOPSY  09/25/2008   gleason 4+3=7   PROSTATE CRYOABLATION  05/05/2009   Dr Isla Mari   RIGHT/LEFT HEART CATH AND CORONARY ANGIOGRAPHY N/A 11/02/2023   Procedure: RIGHT/LEFT HEART CATH AND CORONARY ANGIOGRAPHY;  Surgeon: [place], Dayan Kreis M, MD;  Location: [facility] INVASIVE CV LAB;  Service: Cardiovascular;  Laterality: N/A;    Social History   Tobacco Use  Smoking Status Never  Smokeless Tobacco Never    Social History   Substance and Sexual Activity  Alcohol  Use No    Family History  Problem Relation Age of Onset   Heart disease Mother    Heart failure Mother    Heart disease Brother    Hematuria Father    Cancer Father        prostate   Heart disease Sister     Review of Systems: As noted in history of present illness.  All other systems were reviewed and are negative.  Physical Exam: BP 122/72 (BP Location: Left Arm, Patient Position: Sitting)   Pulse 72   Ht 5\' 7"  (1.702 m)   Wt 143 lb 12.8 oz (65.2 kg)   SpO2 96%   BMI 22.52 kg/m  GENERAL:  Well appearing elderly WM in NAD HEENT:  PERRL, EOMI, sclera are clear. Oropharynx is clear. NECK:  no JVD. carotid upstroke brisk and symmetric, no bruits, no thyromegaly or adenopathy LUNGS:  Clear to auscultation bilaterally CHEST:  Unremarkable HEART:  IRRR,  PMI not displaced or sustained,S1 and S2 within normal limits, no S3, no S4: no clicks, no rubs, no murmurs ABD:  Soft, nontender. BS +, no masses or bruits. No hepatomegaly, no splenomegaly EXT:  2 + pulses throughout, No edema, no cyanosis no clubbing SKIN:  Warm and dry.  No rashes NEURO:  Alert and oriented x 3. Cranial nerves II through XII intact. PSYCH:  Cognitively intact   LABORATORY DATA: Lab Results  Component Value Date   WBC 6.6 10/26/2023   HGB 14.6 11/02/2023   HGB 14.6 11/02/2023   HCT 43.0 11/02/2023   HCT 43.0 11/02/2023   PLT 170 10/26/2023   GLUCOSE 74 02/02/2024   CHOL 137 08/29/2018   TRIG 57 08/29/2018   HDL 74 08/29/2018   LDLCALC 52 08/29/2018   ALT 13 08/29/2018   AST 24 08/29/2018   NA 144 02/02/2024   K 5.1 02/02/2024   CL 106 02/02/2024   CREATININE 1.02 02/02/2024   BUN 18 02/02/2024   CO2 25 02/02/2024   TSH 2.02 11/14/2013   INR 1.1 10/26/2023   Labs dated 10/04/19: cholesterol 145, triglycerides 75, HDL 68, LDL 61. CBC and chemistries normal Dated 10/21/20: cholesterol 141, triglycerides 64, HDL 71, LDL 56. CBC normal Dated 11/17/20: potassium 5.4 otherwise CMET  normal. Dated 11/03/21: cholesterol 139, triglycerides 64, HDL 71, LDL 55. CMET and CBC normal. Dated 11/10/22: cholesterol 145, triglycerides 63, HDL 71, LDL 61. CBC and CMET normal Dated 09/12/23: potassium 5.7 otherwise BMET normal   EKG Interpretation Date/Time:  Tuesday May 22 2024 10:20:19 EDT Ventricular Rate:  72 PR Interval:    QRS Duration:  92 QT Interval:  384 QTC Calculation: 420 R Axis:   -53  Text Interpretation: Atrial fibrillation Left axis deviation Septal infarct (cited on or before 22-May-2024) Inferior infarct , age undetermined When compared with ECG of 12-Oct-2023 08:49, No  significant change was found Confirmed by [place], Jaziya Obarr [phone]) on 05/22/2024 10:36:11 AM   EKG Interpretation Date/Time:  Tuesday May 22 2024 10:20:19 EDT Ventricular Rate:  72 PR Interval:    QRS Duration:  92 QT Interval:  384 QTC Calculation: 420 R Axis:   -53  Text Interpretation: Atrial fibrillation Left axis deviation Septal infarct (cited on or before 22-May-2024) Inferior infarct , age undetermined When compared with ECG of 12-Oct-2023 08:49, No significant change was found Confirmed by [place], Nicolae Vasek [phone]) on 05/22/2024 10:36:11 AM  EKG Interpretation Date/Time:  Tuesday May 22 2024 10:20:19 EDT Ventricular Rate:  72 PR Interval:    QRS Duration:  92 QT Interval:  384 QTC Calculation: 420 R Axis:   -53  Text Interpretation: Atrial fibrillation Left axis deviation Septal infarct (cited on or before 22-May-2024) Inferior infarct , age undetermined When compared with ECG of 12-Oct-2023 08:49, No significant change was found Confirmed by [place], Jacobo Moncrief [id number]) on 05/22/2024 10:36:11 AMEcho 09/16/23: IMPRESSIONS     1. Left ventricular ejection fraction, by estimation, is 35 to 40%. The  left ventricle has moderately decreased function. The left ventricle  demonstrates regional wall motion abnormalities (see scoring  diagram/findings for description). The left  ventricular internal  cavity size was moderately dilated. Left ventricular  diastolic function could not be evaluated. There is disproportionately  severe hypokinesis in the apex and mid-apical anterior and septal wlls.   2. Right ventricular systolic function is moderately reduced. The right  ventricular size is mildly enlarged. There is moderately elevated  pulmonary artery systolic pressure. The estimated right ventricular  systolic pressure is 53.9 mmHg.   3. Left atrial size was severely dilated.   4. Right atrial size was severely dilated.   5. The mitral valve is normal in structure. Moderate mitral valve  regurgitation. No evidence of mitral stenosis.   6. The aortic valve is tricuspid. There is mild calcification of the  aortic valve. There is mild thickening of the aortic valve. Aortic valve  regurgitation is not visualized. Aortic valve sclerosis/calcification is  present, without any evidence of  aortic stenosis.   7. Aortic dilatation noted. There is borderline dilatation of the aortic  root, measuring 38 mm. There is borderline dilatation of the ascending  aorta, measuring 38 mm.   8. The inferior vena cava is dilated in size with <50% respiratory  variability, suggesting right atrial pressure of 15 mmHg.   Comparison(s): Prior images unable to be directly viewed, comparison made  by report only.   PET CT 10/12/23:   There is a medium size, severe, fixed defect present in the apical septum and apex consistent with prior LAD infarction. There is a largely fixed, medium size, severe defect present in the basal to mid lateral wall consistent with infarction in the LCX territory. LVEF is moderately reduced and increases slightly with stress (32%->35%). MBF is not accurate due to prior revascularization. The findings are consistent with the patient's known LAD infarction, but the lateral infarct appears new. There is no ischemia on this study. High-risk study based on new infarction and moderately  reduced LVEF. Findings suggest ischemic cardiomyopathy.   LV perfusion is abnormal. There is no evidence of ischemia. There is evidence of infarction. Defect 1: There is a medium defect with severe reduction in uptake present in the apical to mid septal and apex location(s) that is fixed. There is abnormal wall motion in the defect area. Consistent with infarction. Defect 2: There is a medium defect  with severe reduction in uptake present in the mid to basal lateral location(s) that is fixed. There is abnormal wall motion in the defect area. Consistent with infarction.   Rest left ventricular function is abnormal. Rest global function is moderately reduced. There were multiple regional abnormalities. Rest EF: 32%. Stress left ventricular function is abnormal. Stress global function is moderately reduced. There were multiple regional abnormalities. Stress EF: 34%. End diastolic cavity size is mildly enlarged.   Myocardial blood flow reserve is not reported in this patient due to technical or patient-specific concerns that affect accuracy.   Coronary calcium assessment not performed due to prior revascularization.   Findings are consistent with infarction. The study is high risk.   Electronically signed by Jackquelyn Mass, MD ____________________________________________  Cardiac cath 11/02/23:  RIGHT/LEFT HEART CATH AND CORONARY ANGIOGRAPHY   Conclusion  Single vessel obstructive disease involving the PDA. This is not significantly changed from 2009. Widely patent LAD stent. Otherwise nonobstructive disease Normal LV filling pressures. LVEDP 11 mm Hg. PCWP 13/14 with mean 14 mm Hg Mild pulmonary HTN. PAP 43/21 mean 26 mm Hg. RA pressure normal 6 mm Hg Cardiac output 4.2 L/min, index 2.23.    Plan: recommend optimizing CHF therapy.    Diagnostic Dominance: Right  Intervention   Assessment / Plan: 1. Coronary disease with remote anterior myocardial infarction 2009 treated with drug-eluting  stent to the LAD. Recent cardiac cath in November showed patent LAD stent. No obstructive disease in LCx or ramus. Chronic obstruction in small PDA. Continue medical therapy. No angina.  2. Hypertension, well controlled  3. Hypercholesterolemia, on Zocor . Excellent control. At goal  LDL 61.  4. Chronic combined systolic/diastolic CHF. Left ventricular dysfunction. Ejection fraction has decreased to 35%. BNP elevated. Cardiac cath showed normal LV filling pressures and cardiac output. Currently on metoprolol , Entresto , aldactone . No evidence of volume overload. Weight is down significantly. Given his fatigue and DOE will hold metoprolol . continue Entresto  to 97/103 mg bid and  Farxiga  10 mg daily.  Continue low dose aldactone . Will repeat Echo prior to next visit in August.   We will check complete labs today  5. Atrial fibrillation. Rate is controlled on low-dose metoprolol . Patient is  asymptomatic. [place] score is 3. Continue Xarelto  20 mg daily. Will monitor HR as we come off metoprolol   6. Vertebral compression fracture. S/p vertebroplasty.

## 2024-05-22 NOTE — Patient Instructions (Signed)
 Medication Instructions:  Stop Metoprolol   Continue all other medications *If you need a refill on your cardiac medications before your next appointment, please call your pharmacy*  Lab Work: Cbc,cmet,lipid panel,tsh today  Testing/Procedures: None ordered  Follow-Up: At [facility] you and your health needs are our priority.  As part of our continuing mission to provide you with exceptional heart care, our providers are all part of one team.  This team includes your primary Cardiologist (physician) and Advanced Practice Providers or APPs (Physician Assistants and Nurse Practitioners) who all work together to provide you with the care you need, when you need it.  Your next appointment:  Monday 8/25 at 9:20 am    Provider:  Dr.Jordan   We recommend signing up for the patient portal called "MyChart".  Sign up information is provided on this After Visit Summary.  MyChart is used to connect with patients for Virtual Visits (Telemedicine).  Patients are able to view lab/test results, encounter notes, upcoming appointments, etc.  Non-urgent messages can be sent to your provider as well.   To learn more about what you can do with MyChart, go to [email or web address].

## 2024-05-23 ENCOUNTER — Ambulatory Visit: Payer: Self-pay | Admitting: Cardiology

## 2024-05-23 LAB — CBC WITH DIFFERENTIAL/PLATELET
Basophils Absolute: 0 10*3/uL (ref 0.0–0.2)
Basos: 0 %
EOS (ABSOLUTE): 0.1 10*3/uL (ref 0.0–0.4)
Eos: 1 %
Hematocrit: 49.6 % (ref 37.5–51.0)
Hemoglobin: 16.4 g/dL (ref 13.0–17.7)
Immature Grans (Abs): 0 10*3/uL (ref 0.0–0.1)
Immature Granulocytes: 0 %
Lymphocytes Absolute: 2.9 10*3/uL (ref 0.7–3.1)
Lymphs: 39 %
MCH: 33.8 pg — ABNORMAL HIGH (ref 26.6–33.0)
MCHC: 33.1 g/dL (ref 31.5–35.7)
MCV: 102 fL — ABNORMAL HIGH (ref 79–97)
Monocytes Absolute: 0.6 10*3/uL (ref 0.1–0.9)
Monocytes: 8 %
Neutrophils Absolute: 3.8 10*3/uL (ref 1.4–7.0)
Neutrophils: 52 %
Platelets: 176 10*3/uL (ref 150–450)
RBC: 4.85 x10E6/uL (ref 4.14–5.80)
RDW: 12.1 % (ref 11.6–15.4)
WBC: 7.3 10*3/uL (ref 3.4–10.8)

## 2024-05-23 LAB — LIPID PANEL
Cholesterol, Total: 166 mg/dL (ref 100–199)
HDL: 68 mg/dL (ref >39)
LDL CALC COMMENT:: 2.4 ratio (ref 0.0–5.0)
LDL Chol Calc (NIH): 80 mg/dL (ref 0–99)
Triglycerides: 99 mg/dL (ref 0–149)
VLDL Cholesterol Cal: 18 mg/dL (ref 5–40)

## 2024-05-23 LAB — COMPREHENSIVE METABOLIC PANEL WITH GFR
ALT: 10 IU/L (ref 0–44)
AST: 25 IU/L (ref 0–40)
Albumin: 4.3 g/dL (ref 3.7–4.7)
Alkaline Phosphatase: 161 IU/L — ABNORMAL HIGH (ref 44–121)
BUN/Creatinine Ratio: 16 (ref 10–24)
BUN: 15 mg/dL (ref 8–27)
Bilirubin Total: 0.8 mg/dL (ref 0.0–1.2)
CO2: 22 mmol/L (ref 20–29)
Calcium: 10.2 mg/dL (ref 8.6–10.2)
Chloride: 103 mmol/L (ref 96–106)
Creatinine, Ser: 0.95 mg/dL (ref 0.76–1.27)
Globulin, Total: 2.3 g/dL (ref 1.5–4.5)
Glucose: 114 mg/dL — ABNORMAL HIGH (ref 70–99)
Potassium: 4.4 mmol/L (ref 3.5–5.2)
Sodium: 142 mmol/L (ref 134–144)
Total Protein: 6.6 g/dL (ref 6.0–8.5)
eGFR: 77 mL/min/{1.73_m2} (ref >59)

## 2024-05-23 LAB — TSH: TSH: 2.45 u[IU]/mL (ref 0.450–4.500)

## 2024-05-25 ENCOUNTER — Other Ambulatory Visit: Payer: Self-pay

## 2024-05-25 MED ORDER — DAPAGLIFLOZIN PROPANEDIOL 10 MG PO TABS: 10.0000 mg | ORAL_TABLET | Freq: Every day | ORAL | 3 refills | Status: AC

## 2024-07-19 ENCOUNTER — Encounter (HOSPITAL_BASED_OUTPATIENT_CLINIC_OR_DEPARTMENT_OTHER): Payer: Self-pay | Admitting: Family Medicine

## 2024-07-19 DIAGNOSIS — N2889 Other specified disorders of kidney and ureter: Secondary | ICD-10-CM

## 2024-07-20 ENCOUNTER — Other Ambulatory Visit (HOSPITAL_BASED_OUTPATIENT_CLINIC_OR_DEPARTMENT_OTHER): Payer: Self-pay | Admitting: Family Medicine

## 2024-07-20 DIAGNOSIS — N2889 Other specified disorders of kidney and ureter: Secondary | ICD-10-CM

## 2024-07-25 ENCOUNTER — Telehealth: Payer: Self-pay | Admitting: *Deleted

## 2024-07-25 NOTE — Telephone Encounter (Signed)
 Pharmacy please advise on holding Xarelto  prior to L5 KYPHOPLASTY scheduled for TBD. Last labs 05/22/2024. Has appointment with Dr. Swaziland on 08/20/2024  Thank you.

## 2024-07-25 NOTE — Telephone Encounter (Signed)
   Pre-operative Risk Assessment    Patient Name: [name]  DOB: March 14, 1933 MRN: [id number]   Date of last office visit: 05/22/24 DR. PETER SWAZILAND  Date of next office visit: 08/20/24 DR. PETER SWAZILAND 4 MONTH F/U ECHO   Request for Surgical Clearance    Procedure:  L5 KYPHOPLASTY  Date of Surgery:  Clearance TBD                                Surgeon:  DR. [facility] BROOKS Surgeon's Group or Practice Name:  [facility] Phone number:  [phone]  Fax number:  [phone] ATTN: KATHERYN STONE   Type of Clearance Requested:   - Medical  - Pharmacy:  Hold Rivaroxaban  (Xarelto )     Type of Anesthesia:  Not Indicated   Additional requests/questions:    Bonney Niels Jest   07/25/2024, 4:20 PM

## 2024-07-26 ENCOUNTER — Ambulatory Visit (HOSPITAL_BASED_OUTPATIENT_CLINIC_OR_DEPARTMENT_OTHER)
Admission: RE | Admit: 2024-07-26 | Discharge: 2024-07-26 | Disposition: A | Discharge status: Home / Self Care | Source: Ambulatory Visit | Attending: Family Medicine | Admitting: Family Medicine

## 2024-07-26 DIAGNOSIS — N2889 Other specified disorders of kidney and ureter: Secondary | ICD-10-CM | POA: Diagnosis present

## 2024-07-30 ENCOUNTER — Ambulatory Visit: Payer: Self-pay | Admitting: Cardiology

## 2024-07-30 ENCOUNTER — Ambulatory Visit (HOSPITAL_COMMUNITY)
Admission: RE | Admit: 2024-07-30 | Discharge: 2024-07-30 | Disposition: A | Discharge status: Home / Self Care | Source: Ambulatory Visit | Attending: Cardiology | Admitting: Cardiology

## 2024-07-30 DIAGNOSIS — I5042 Chronic combined systolic (congestive) and diastolic (congestive) heart failure: Secondary | ICD-10-CM | POA: Insufficient documentation

## 2024-07-30 DIAGNOSIS — I10 Essential (primary) hypertension: Secondary | ICD-10-CM | POA: Insufficient documentation

## 2024-07-30 DIAGNOSIS — Z9861 Coronary angioplasty status: Secondary | ICD-10-CM | POA: Insufficient documentation

## 2024-07-30 DIAGNOSIS — I2510 Atherosclerotic heart disease of native coronary artery without angina pectoris: Secondary | ICD-10-CM | POA: Diagnosis present

## 2024-07-30 DIAGNOSIS — I482 Chronic atrial fibrillation, unspecified: Secondary | ICD-10-CM | POA: Insufficient documentation

## 2024-07-30 LAB — ECHOCARDIOGRAM COMPLETE
Area-P 1/2: 4.07 cm2
S' Lateral: 4 cm

## 2024-07-31 ENCOUNTER — Encounter: Payer: Self-pay | Admitting: Cardiology

## 2024-07-31 ENCOUNTER — Telehealth: Payer: Self-pay

## 2024-07-31 NOTE — Telephone Encounter (Signed)
 Spoke to patient's daughter Verneita she stated she brought a surgical clearance form from [facility] Dr.Brooks to office last week.Stated [facility] needs form completed and sent back to them so they can schedule father's kyphoplasty procedure.Stated father is having a lot of back pain.Advised I will send message to pre op team.

## 2024-07-31 NOTE — Telephone Encounter (Signed)
   Name: ZYRUS HETLAND  DOB: 04-11-1933  MRN: [id number]  Primary Cardiologist: Peter Swaziland, MD  Chart reviewed as part of pre-operative protocol coverage. The patient has an upcoming visit scheduled with Dr. Swaziland on 08/20/24 at which time clearance can be addressed in case there are any issues that would impact surgical recommendations.  L5 kyphoplasty is not scheduled as below. I added preop FYI to appointment note so that provider is aware to address at time of outpatient visit.  Per office protocol the cardiology provider should forward their finalized clearance decision and recommendations regarding antiplatelet therapy to the requesting party below.    I will route this message as FYI to requesting party and remove this message from the preop box as separate preop APP input not needed at this time.   Please call with any questions.  Ada Woodbury D Cloteal Isaacson, NP  07/31/2024, 3:07 PM

## 2024-07-31 NOTE — Telephone Encounter (Signed)
 Patient with diagnosis of atrial fibrillation on Xarelto  for anticoagulation.    Procedure:  L5 KYPHOPLASTY   Date of Surgery:  Clearance TBD    CHA2DS2-VASc Score = 5   This indicates a 7.2% annual risk of stroke. The patient's score is based upon: CHF History: 1 HTN History: 1 Diabetes History: 0 Stroke History: 0 Vascular Disease History: 1 Age Score: 2 Gender Score: 0    CrCl 49 Platelet count 176  Patient has not had an Afib/aflutter ablation within the last 3 months or DCCV within the last 30 days  Per office protocol, patient can hold Xarelto  for 3 days prior to procedure.   Patient will not need bridging with Lovenox (enoxaparin) around procedure.  **This guidance is not considered finalized until pre-operative APP has relayed final recommendations.**

## 2024-07-31 NOTE — Telephone Encounter (Signed)
 Dr.Jordan already cleared patient for kyphoplasty.Note faxed to Dr.Brooks RN at fax # [phone].

## 2024-07-31 NOTE — Telephone Encounter (Signed)
 Spoke to [facility] at [facility] and was made aware the patient's surgery is on 08/24/24. His scheduled appointment for 08/20/24 with Dr. Swaziland will be enough time for him to have clearance.

## 2024-07-31 NOTE — Telephone Encounter (Signed)
 Patient contacted Dr. Swaziland directly.  Dr. Swaziland has provided clearance and recommendations regarding holding of Xarelto  through MyChart.  Channing Morones, LPN fax Dr. Gib letter to Dr. Burnetta nurse on 07/31/2024.  Will remove request from preoperative pool.

## 2024-07-31 NOTE — Telephone Encounter (Signed)
 Dr.Jordan's note clearing patient for kyphoplasty faxed to Dr.Brooks RN at fax # [phone].

## 2024-08-07 NOTE — Progress Notes (Signed)
 [name] Date of Birth: 02/15/1933   History of Present Illness: [name] is seen today for followup CHF and increased edema.  He has a history of an anterior myocardial infarction in November 2009. This was treated with a drug-eluting stent to the mid LAD.  He had a stress Myoview  study November 2012. He is able to walk for 8 minutes. He had no clinical symptoms. Images showed a fixed anterior septal and apical defect. There was no ischemia. Ejection fraction was 44%.   In November 2014 he was found to be in atrial fibrillation with a controlled response. He was started on Xarelto . Echo showed EF 40-45% with moderate biatrial enlargement. Treated with rate control and anticoagulation.   He was recently seen in the ED on 09/05/23 and treated for community acquired PNA. Troponins normal. Ecg without acute change. Afib controlled. BNP was elevated 449. This was repeated on 9/17 and was 466. No old values for comparison. Echo done showing EF 35-40%. Moderate pulmonary HTN with RV systolic pressure estimated at 54 mm Hg. Severe biatrial enlargement. Moderate MR. He had potassium of 3.4 in ED. This was repleted and on repeat was 5.7 at PCP.   We performed a cardiac CT PET to evaluate further and this was interpreted as high risk. Cardiac cath showed nonobstructive disease except for a small PDA. He had mild pulmonary HTN with normal LV filling pressures.  Repeat Echo recently showed no change in EF but improvement in pulmonary pressures and RV function.   On follow up today he is doing well. No increase in SOB or swelling. Weight stable at home. We had stopped low dose metoprolol  before due to fatigue and Pulse rate still in 70s at home. Planning to have vertebroplasty by Dr Burnetta this Friday for compression fracture L5    Current Outpatient Medications on File Prior to Visit  Medication Sig Dispense Refill   calcitonin, salmon, (MIACALCIN/FORTICAL) 200 UNIT/ACT nasal spray Place 1 spray into  alternate nostrils daily.     Cholecalciferol (VITAMIN D3) 2000 UNITS TABS Take 2,000 Int'l Units by mouth daily.     dapagliflozin  propanediol (FARXIGA ) 10 MG TABS tablet Take 1 tablet (10 mg total) by mouth daily before breakfast. 90 tablet 3   methocarbamol  (ROBAXIN ) 500 MG tablet Take 1-2 tablets (500-1,000 mg total) by mouth every 6 (six) hours as needed. 60 tablet 2   sacubitril -valsartan  (ENTRESTO ) 97-103 MG Take 1 tablet by mouth 2 (two) times daily. 180 tablet 3   simvastatin  (ZOCOR ) 40 MG tablet TAKE 1 TABLET BY MOUTH AT BEDTIME 90 tablet 3   spironolactone  (ALDACTONE ) 25 MG tablet Take 0.5 tablets (12.5 mg total) by mouth daily. 45 tablet 3   No current facility-administered medications on file prior to visit.    No Known Allergies  Past Medical History:  Diagnosis Date   Atrial fibrillation [facility])    Coronary artery disease    Nov 2009-MI-stenting of the mid LAD drug-eluding stent   Hx of radiation therapy 05/15/14- 07/11/14   prostate 7800 cGy 40 sessions, seminal vesicles 5600 cGy 40 sessions   Hyperlipidemia    Hypertension    LV dysfunction    MI (myocardial infarction) (HCC) 1998   Osteopenia    Prostate CA (HCC) 05/05/2009   prostate cancer 11/09-tx with cryotherapy, Dr Chales    Past Surgical History:  Procedure Laterality Date   CARDIAC CATHETERIZATION     Nov 2009-drug eluding stent to mid LAD   HERNIA REPAIR  inguinal   IR KYPHO EA ADDL LEVEL THORACIC OR LUMBAR  04/28/2023   IR KYPHO LUMBAR INC FX REDUCE BONE BX UNI/BIL CANNULATION INC/IMAGING  04/28/2023   PROSTATE BIOPSY  09/25/2008   gleason 4+3=7   PROSTATE CRYOABLATION  05/05/2009   Dr Chales   RIGHT/LEFT HEART CATH AND CORONARY ANGIOGRAPHY N/A 11/02/2023   Procedure: RIGHT/LEFT HEART CATH AND CORONARY ANGIOGRAPHY;  Surgeon: [place], Lenisha Lacap M, MD;  Location: [facility] INVASIVE CV LAB;  Service: Cardiovascular;  Laterality: N/A;    Social History   Tobacco Use  Smoking Status Never  Smokeless Tobacco  Never    Social History   Substance and Sexual Activity  Alcohol Use No    Family History  Problem Relation Age of Onset   Heart disease Mother    Heart failure Mother    Heart disease Brother    Hematuria Father    Cancer Father        prostate   Heart disease Sister     Review of Systems: As noted in history of present illness.  All other systems were reviewed and are negative.  Physical Exam: BP 122/84 (BP Location: Left Arm, Patient Position: Sitting, Cuff Size: Normal)   Pulse 92   Wt 148 lb (67.1 kg)   SpO2 93%   BMI 23.18 kg/m  GENERAL:  Well appearing elderly WM in NAD HEENT:  PERRL, EOMI, sclera are clear. Oropharynx is clear. NECK:  no JVD. carotid upstroke brisk and symmetric, no bruits, no thyromegaly or adenopathy LUNGS:  Clear to auscultation bilaterally CHEST:  Unremarkable HEART:  IRRR,  PMI not displaced or sustained,S1 and S2 within normal limits, no S3, no S4: no clicks, no rubs, no murmurs ABD:  Soft, nontender. BS +, no masses or bruits. No hepatomegaly, no splenomegaly EXT:  2 + pulses throughout, No edema, no cyanosis no clubbing SKIN:  Warm and dry.  No rashes NEURO:  Alert and oriented x 3. Cranial nerves II through XII intact. PSYCH:  Cognitively intact   LABORATORY DATA: Lab Results  Component Value Date   WBC 7.3 05/22/2024   HGB 16.4 05/22/2024   HCT 49.6 05/22/2024   PLT 176 05/22/2024   GLUCOSE 114 (H) 05/22/2024   CHOL 166 05/22/2024   TRIG 99 05/22/2024   HDL 68 05/22/2024   LDLCALC 80 05/22/2024   ALT 10 05/22/2024   AST 25 05/22/2024   NA 142 05/22/2024   K 4.4 05/22/2024   CL 103 05/22/2024   CREATININE 0.95 05/22/2024   BUN 15 05/22/2024   CO2 22 05/22/2024   TSH 2.450 05/22/2024   INR 1.1 10/26/2023   Labs dated 10/04/19: cholesterol 145, triglycerides 75, HDL 68, LDL 61. CBC and chemistries normal Dated 10/21/20: cholesterol 141, triglycerides 64, HDL 71, LDL 56. CBC normal Dated 11/17/20: potassium 5.4  otherwise CMET normal. Dated 11/03/21: cholesterol 139, triglycerides 64, HDL 71, LDL 55. CMET and CBC normal. Dated 11/10/22: cholesterol 145, triglycerides 63, HDL 71, LDL 61. CBC and CMET normal Dated 09/12/23: potassium 5.7 otherwise BMET normal           Echo 09/16/23: IMPRESSIONS     1. Left ventricular ejection fraction, by estimation, is 35 to 40%. The  left ventricle has moderately decreased function. The left ventricle  demonstrates regional wall motion abnormalities (see scoring  diagram/findings for description). The left  ventricular internal cavity size was moderately dilated. Left ventricular  diastolic function could not be evaluated. There is disproportionately  severe hypokinesis in  the apex and mid-apical anterior and septal wlls.   2. Right ventricular systolic function is moderately reduced. The right  ventricular size is mildly enlarged. There is moderately elevated  pulmonary artery systolic pressure. The estimated right ventricular  systolic pressure is 53.9 mmHg.   3. Left atrial size was severely dilated.   4. Right atrial size was severely dilated.   5. The mitral valve is normal in structure. Moderate mitral valve  regurgitation. No evidence of mitral stenosis.   6. The aortic valve is tricuspid. There is mild calcification of the  aortic valve. There is mild thickening of the aortic valve. Aortic valve  regurgitation is not visualized. Aortic valve sclerosis/calcification is  present, without any evidence of  aortic stenosis.   7. Aortic dilatation noted. There is borderline dilatation of the aortic  root, measuring 38 mm. There is borderline dilatation of the ascending  aorta, measuring 38 mm.   8. The inferior vena cava is dilated in size with <50% respiratory  variability, suggesting right atrial pressure of 15 mmHg.   Comparison(s): Prior images unable to be directly viewed, comparison made  by report only.   PET CT 10/12/23:   There is a medium  size, severe, fixed defect present in the apical septum and apex consistent with prior LAD infarction. There is a largely fixed, medium size, severe defect present in the basal to mid lateral wall consistent with infarction in the LCX territory. LVEF is moderately reduced and increases slightly with stress (32%->35%). MBF is not accurate due to prior revascularization. The findings are consistent with the patient's known LAD infarction, but the lateral infarct appears new. There is no ischemia on this study. High-risk study based on new infarction and moderately reduced LVEF. Findings suggest ischemic cardiomyopathy.   LV perfusion is abnormal. There is no evidence of ischemia. There is evidence of infarction. Defect 1: There is a medium defect with severe reduction in uptake present in the apical to mid septal and apex location(s) that is fixed. There is abnormal wall motion in the defect area. Consistent with infarction. Defect 2: There is a medium defect with severe reduction in uptake present in the mid to basal lateral location(s) that is fixed. There is abnormal wall motion in the defect area. Consistent with infarction.   Rest left ventricular function is abnormal. Rest global function is moderately reduced. There were multiple regional abnormalities. Rest EF: 32%. Stress left ventricular function is abnormal. Stress global function is moderately reduced. There were multiple regional abnormalities. Stress EF: 34%. End diastolic cavity size is mildly enlarged.   Myocardial blood flow reserve is not reported in this patient due to technical or patient-specific concerns that affect accuracy.   Coronary calcium assessment not performed due to prior revascularization.   Findings are consistent with infarction. The study is high risk.   Electronically signed by Darryle Decent, MD ____________________________________________  Cardiac cath 11/02/23:  RIGHT/LEFT HEART CATH AND CORONARY ANGIOGRAPHY    Conclusion  Single vessel obstructive disease involving the PDA. This is not significantly changed from 2009. Widely patent LAD stent. Otherwise nonobstructive disease Normal LV filling pressures. LVEDP 11 mm Hg. PCWP 13/14 with mean 14 mm Hg Mild pulmonary HTN. PAP 43/21 mean 26 mm Hg. RA pressure normal 6 mm Hg Cardiac output 4.2 L/min, index 2.23.    Plan: recommend optimizing CHF therapy.    Diagnostic Dominance: Right  Intervention  Echo 07/30/24: IMPRESSIONS     1. Left ventricular ejection fraction, by estimation, is  35 to 40%. The  left ventricle has moderately decreased function. The left ventricle  demonstrates global hypokinesis. There is mild left ventricular  hypertrophy of the basal-septal segment. Left  ventricular diastolic function could not be evaluated.   2. Right ventricular systolic function is mildly reduced. The right  ventricular size is normal. There is normal pulmonary artery systolic  pressure.   3. Left atrial size was severely dilated.   4. The mitral valve is degenerative. Moderate mitral valve regurgitation.  No evidence of mitral stenosis.   5. The aortic valve is tricuspid. Aortic valve regurgitation is not  visualized. Aortic valve sclerosis/calcification is present, without any  evidence of aortic stenosis.   6. Aortic dilatation noted. There is mild dilatation of the ascending  aorta, measuring 39 mm.   7. The inferior vena cava is normal in size with greater than 50%  respiratory variability, suggesting right atrial pressure of 3 mmHg.    Assessment / Plan: 1. Coronary disease with remote anterior myocardial infarction 2009 treated with drug-eluting stent to the LAD. Recent cardiac cath in November showed patent LAD stent. No obstructive disease in LCx or ramus. Chronic obstruction in small PDA. Continue medical therapy. No angina.  2. Hypertension, well controlled  3. Hypercholesterolemia, on Zocor . LDL 80.   4. Chronic combined  systolic/diastolic CHF. Left ventricular dysfunction. Ejection fraction has decreased to 35%. BNP elevated. Cardiac cath showed normal LV filling pressures and cardiac output. Currently on Entresto , aldactone , Farxiga . No evidence of volume overload. Weight is stable. Metoprolol  discontinued duet to fatigue. continue Entresto  to 97/103 mg bid and  Farxiga  10 mg daily.  Continue low dose aldactone . Repeat Echo recently showed no significant change in EF 35-40%.   5. Atrial fibrillation. Rate is controlled on no medication.  Patient is  asymptomatic. [place] score is 3. Continue Xarelto  20 mg daily.   6. Vertebral compression fracture. Plan vertebroplasty this Friday. Hold Xarelto  after Tuesday for procedure.

## 2024-08-20 ENCOUNTER — Ambulatory Visit: Attending: Cardiology | Admitting: Cardiology

## 2024-08-20 ENCOUNTER — Encounter: Payer: Self-pay | Admitting: Cardiology

## 2024-08-20 VITALS — BP 122/84 | HR 92 | Wt 148.0 lb

## 2024-08-20 DIAGNOSIS — I2510 Atherosclerotic heart disease of native coronary artery without angina pectoris: Secondary | ICD-10-CM | POA: Diagnosis not present

## 2024-08-20 DIAGNOSIS — Z9861 Coronary angioplasty status: Secondary | ICD-10-CM | POA: Diagnosis not present

## 2024-08-20 DIAGNOSIS — I482 Chronic atrial fibrillation, unspecified: Secondary | ICD-10-CM | POA: Diagnosis not present

## 2024-08-20 DIAGNOSIS — I5042 Chronic combined systolic (congestive) and diastolic (congestive) heart failure: Secondary | ICD-10-CM | POA: Diagnosis not present

## 2024-08-20 MED ORDER — RIVAROXABAN 20 MG PO TABS: ORAL_TABLET | ORAL | 3 refills | Status: AC

## 2024-08-20 NOTE — Patient Instructions (Signed)
 Medication Instructions:  Continue same medications *If you need a refill on your cardiac medications before your next appointment, please call your pharmacy*  Lab Work: None ordered  Testing/Procedures: None ordered  Follow-Up: At [facility], you and your health needs are our priority.  As part of our continuing mission to provide you with exceptional heart care, our providers are all part of one team.  This team includes your primary Cardiologist (physician) and Advanced Practice Providers or APPs (Physician Assistants and Nurse Practitioners) who all work together to provide you with the care you need, when you need it.  Your next appointment:  6 months   Call in Oct to schedule Feb appointment     Provider:  Dr.Jordan   We recommend signing up for the patient portal called MyChart.  Sign up information is provided on this After Visit Summary.  MyChart is used to connect with patients for Virtual Visits (Telemedicine).  Patients are able to view lab/test results, encounter notes, upcoming appointments, etc.  Non-urgent messages can be sent to your provider as well.   To learn more about what you can do with MyChart, go to [email or web address].

## 2024-08-21 ENCOUNTER — Ambulatory Visit (HOSPITAL_COMMUNITY): Payer: Self-pay | Admitting: Physician Assistant

## 2024-08-31 NOTE — Progress Notes (Deleted)
.  5chg

## 2024-08-31 NOTE — Progress Notes (Addendum)
 Surgical Instructions   Your procedure is scheduled on Thursday September 11. Report to [facility] Main Entrance A at 5:30 A.M., then check in with the Admitting office. Any questions or running late day of surgery: call [phone]  Questions prior to your surgery date: call [phone], Monday-Friday, 8am-4pm. If you experience any cold or flu symptoms such as cough, fever, chills, shortness of breath, etc. between now and your scheduled surgery, please notify us  at the above number.     Remember:  Do not eat after midnight the night before your surgery  You may drink clear liquids until 4:30am the morning of your surgery.   Clear liquids allowed are: Water , Non-Citrus Juices (without pulp), Carbonated Beverages, Clear Tea (no milk, honey, etc.), Black Coffee Only (NO MILK, CREAM OR POWDERED CREAMER of any kind), and Gatorade.    Take these medicines the morning of surgery with A SIP OF WATER  : none  May take these medicines IF NEEDED: methocarbamol  (ROBAXIN )   Per your cardiologist's  instructions, you can hold Xarelto  48 hours prior to surgery. Take your last dose on 9/8.  Per anesthesia protocol,hold dapagliflozin  propanediol (FARXIGA ) for 72 hours prior to surgery. Take your last dose on 9/7.        One week prior to surgery, STOP taking any Aspirin  (unless otherwise instructed by your surgeon) Aleve, Naproxen, Ibuprofen, Motrin, Advil, Goody's, BC's, all herbal medications, fish oil, and non-prescription vitamins.                     Do NOT Smoke (Tobacco/Vaping) for 24 hours prior to your procedure.  If you use a CPAP at night, you may bring your mask/headgear for your overnight stay.   You will be asked to remove any contacts, glasses, piercing's, hearing aid's, dentures/partials prior to surgery. Please bring cases for these items if needed.    Patients discharged the day of surgery will not be allowed to drive home, and someone needs to stay with them for 24  hours.  SURGICAL WAITING ROOM VISITATION Patients may have no more than 2 support people in the waiting area - these visitors may rotate.   Pre-op nurse will coordinate an appropriate time for 1 ADULT support person, who may not rotate, to accompany patient in pre-op.  Children under the age of 68 must have an adult with them who is not the patient and must remain in the main waiting area with an adult.  If the patient needs to stay at the hospital during part of their recovery, the visitor guidelines for inpatient rooms apply.  Please refer to the [facility] website for the visitor guidelines for any additional information.   If you received a COVID test during your pre-op visit  it is requested that you wear a mask when out in public, stay away from anyone that may not be feeling well and notify your surgeon if you develop symptoms. If you have been in contact with anyone that has tested positive in the last 10 days please notify you surgeon.      Pre-operative 5 CHG Bathing Instructions   You can play a key role in reducing the risk of infection after surgery. Your skin needs to be as free of germs as possible. You can reduce the number of germs on your skin by washing with CHG (chlorhexidine  gluconate) soap before surgery. CHG is an antiseptic soap that kills germs and continues to kill germs even after washing.   DO NOT  use if you have an allergy to chlorhexidine /CHG or antibacterial soaps. If your skin becomes reddened or irritated, stop using the CHG and notify one of our RNs at [phone].   Please shower with the CHG soap starting 4 days before surgery using the following schedule:     Please keep in mind the following:  DO NOT shave, including legs and underarms, starting the day of your first shower.   You may shave your face at any point before/day of surgery.  Place clean sheets on your bed the day you start using CHG soap. Use a clean washcloth (not used since being  washed) for each shower. DO NOT sleep with pets once you start using the CHG.   CHG Shower Instructions:  Wash your face and private area with normal soap. If you choose to wash your hair, wash first with your normal shampoo.  After you use shampoo/soap, rinse your hair and body thoroughly to remove shampoo/soap residue.  Turn the water  OFF and apply about 3 tablespoons (45 ml) of CHG soap to a CLEAN washcloth.  Apply CHG soap ONLY FROM YOUR NECK DOWN TO YOUR TOES (washing for 3-5 minutes)  DO NOT use CHG soap on face, private areas, open wounds, or sores.  Pay special attention to the area where your surgery is being performed.  If you are having back surgery, having someone wash your back for you may be helpful. Wait 2 minutes after CHG soap is applied, then you may rinse off the CHG soap.  Pat dry with a clean towel  Put on clean clothes/pajamas   If you choose to wear lotion, please use ONLY the CHG-compatible lotions that are listed below.  Additional instructions for the day of surgery: DO NOT APPLY any lotions, deodorants, cologne, or perfumes.   Do not bring valuables to the hospital. [facility] is not responsible for any belongings/valuables. Do not wear nail polish, gel polish, artificial nails, or any other type of covering on natural nails (fingers and toes) Do not wear jewelry or makeup Put on clean/comfortable clothes.  Please brush your teeth.  Ask your nurse before applying any prescription medications to the skin.     CHG Compatible Lotions   Aveeno Moisturizing lotion  Cetaphil Moisturizing Cream  Cetaphil Moisturizing Lotion  Clairol Herbal Essence Moisturizing Lotion, Dry Skin  Clairol Herbal Essence Moisturizing Lotion, Extra Dry Skin  Clairol Herbal Essence Moisturizing Lotion, Normal Skin  Curel Age Defying Therapeutic Moisturizing Lotion with Alpha Hydroxy  Curel Extreme Care Body Lotion  Curel Soothing Hands Moisturizing Hand Lotion  Curel Therapeutic  Moisturizing Cream, Fragrance-Free  Curel Therapeutic Moisturizing Lotion, Fragrance-Free  Curel Therapeutic Moisturizing Lotion, Original Formula  Eucerin Daily Replenishing Lotion  Eucerin Dry Skin Therapy Plus Alpha Hydroxy Crme  Eucerin Dry Skin Therapy Plus Alpha Hydroxy Lotion  Eucerin Original Crme  Eucerin Original Lotion  Eucerin Plus Crme Eucerin Plus Lotion  Eucerin TriLipid Replenishing Lotion  Keri Anti-Bacterial Hand Lotion  Keri Deep Conditioning Original Lotion Dry Skin Formula Softly Scented  Keri Deep Conditioning Original Lotion, Fragrance Free Sensitive Skin Formula  Keri Lotion Fast Absorbing Fragrance Free Sensitive Skin Formula  Keri Lotion Fast Absorbing Softly Scented Dry Skin Formula  Keri Original Lotion  Keri Skin Renewal Lotion Keri Silky Smooth Lotion  Keri Silky Smooth Sensitive Skin Lotion  Nivea Body Creamy Conditioning Oil  Nivea Body Extra Enriched Teacher, adult education Moisturizing Lotion Nivea Crme  Nivea Skin Firming  Lotion  NutraDerm 30 Skin Lotion  NutraDerm Skin Lotion  NutraDerm Therapeutic Skin Cream  NutraDerm Therapeutic Skin Lotion  ProShield Protective Hand Cream  Provon moisturizing lotion  Please read over the following fact sheets that you were given.

## 2024-09-03 ENCOUNTER — Other Ambulatory Visit: Payer: Self-pay

## 2024-09-03 ENCOUNTER — Encounter (HOSPITAL_COMMUNITY): Payer: Self-pay

## 2024-09-03 ENCOUNTER — Encounter (HOSPITAL_COMMUNITY)
Admission: RE | Admit: 2024-09-03 | Discharge: 2024-09-03 | Disposition: A | Discharge status: Home / Self Care | Source: Ambulatory Visit | Attending: Orthopedic Surgery | Admitting: Orthopedic Surgery

## 2024-09-03 VITALS — BP 134/99 | HR 90 | Temp 98.1°F | Resp 18 | Ht 67.0 in | Wt 147.2 lb

## 2024-09-03 DIAGNOSIS — I10 Essential (primary) hypertension: Secondary | ICD-10-CM | POA: Insufficient documentation

## 2024-09-03 DIAGNOSIS — Z7901 Long term (current) use of anticoagulants: Secondary | ICD-10-CM | POA: Insufficient documentation

## 2024-09-03 DIAGNOSIS — Z01812 Encounter for preprocedural laboratory examination: Secondary | ICD-10-CM | POA: Insufficient documentation

## 2024-09-03 DIAGNOSIS — E785 Hyperlipidemia, unspecified: Secondary | ICD-10-CM | POA: Insufficient documentation

## 2024-09-03 DIAGNOSIS — Z01818 Encounter for other preprocedural examination: Secondary | ICD-10-CM

## 2024-09-03 DIAGNOSIS — I4891 Unspecified atrial fibrillation: Secondary | ICD-10-CM | POA: Insufficient documentation

## 2024-09-03 DIAGNOSIS — I252 Old myocardial infarction: Secondary | ICD-10-CM | POA: Insufficient documentation

## 2024-09-03 LAB — BASIC METABOLIC PANEL WITH GFR
Anion gap: 11 (ref 5–15)
BUN: 17 mg/dL (ref 8–23)
CO2: 26 mmol/L (ref 22–32)
Calcium: 9.8 mg/dL (ref 8.9–10.3)
Chloride: 107 mmol/L (ref 98–111)
Creatinine, Ser: 1.35 mg/dL — ABNORMAL HIGH (ref 0.61–1.24)
GFR, Estimated: 50 mL/min — ABNORMAL LOW (ref >60)
Glucose, Bld: 114 mg/dL — ABNORMAL HIGH (ref 70–99)
Potassium: 4.6 mmol/L (ref 3.5–5.1)
Sodium: 144 mmol/L (ref 135–145)

## 2024-09-03 LAB — CBC
HCT: 47.1 % (ref 39.0–52.0)
Hemoglobin: 15.3 g/dL (ref 13.0–17.0)
MCH: 33.3 pg (ref 26.0–34.0)
MCHC: 32.5 g/dL (ref 30.0–36.0)
MCV: 102.6 fL — ABNORMAL HIGH (ref 80.0–100.0)
Platelets: 151 K/uL (ref 150–400)
RBC: 4.59 MIL/uL (ref 4.22–5.81)
RDW: 13.6 % (ref 11.5–15.5)
WBC: 7.8 K/uL (ref 4.0–10.5)
nRBC: 0 % (ref 0.0–0.2)

## 2024-09-03 LAB — SURGICAL PCR SCREEN
MRSA, PCR: NEGATIVE
Staphylococcus aureus: POSITIVE — AB

## 2024-09-03 NOTE — Progress Notes (Signed)
 PCP - Olam Cleotilde COME Cardiologist - Maude Berry COME  PPM/ICD -denies Device Orders -  Rep Notified -   Chest x-ray - na EKG -05/22/24 Stress Test - 10/12/23 ECHO - 07/30/24 Cardiac Cath - 11/02/23  Sleep Study - denies CPAP -   Fasting Blood Sugar - na Checks Blood Sugar _____ times a day  Last dose of GLP1 agonist- na  GLP1 instructions:   Blood Thinner Instructions: per Dr. Gib instructions,hold Xalrelto 48 hours prior to surgery. Take your last dose on 9/8. Aspirin  Instructions:none  ERAS Protcol - clears until 0430 PRE-SURGERY Ensure or G2-   COVID TEST- na   Anesthesia review: yes -hx afib,MI,coronary angiography.LOV with Cardiology 08/20/24.  Patient denies shortness of breath, fever, cough and chest pain at PAT appointment   All instructions explained to the patient, with a verbal understanding of the material. Patient agrees to go over the instructions while at home for a better understanding.The opportunity to ask questions was provided.

## 2024-09-04 NOTE — Progress Notes (Signed)
 Anesthesia Chart Review:  89 year old male follows with cardiology for history of HTN, HLD, anterior MI 2009 treated with DES to mid LAD, chronic combined heart failure, atrial fibrillation on Xarelto .  Cath 11/02/2023 showed widely patent LAD stent, single-vessel obstructive disease involving the PDA not significantly changed from 2009, otherwise nonobstructive disease-medical therapy recommended.  Echo 07/30/2024 showed stable LVEF 35 to 40%, RV systolic function mildly reduced, moderate mitral regurgitation. Last seen by Dr. Swaziland 08/20/2024, stable from cardiac standpoint.  Upcoming surgery discussed.  Per note, Vertebral compression fracture. Plan vertebroplasty this Friday. Hold Xarelto  after Tuesday for procedure.  Preop labs reviewed, creatinine mildly elevated 1.35, otherwise unremarkable.   EKG 05/22/2024: Atrial fibrillation.  Rate 72. Left axis deviation. Septal infarct (cited on or before 22-May-2024). Inferior infarct , age undetermined  TTE 07/30/2024: 1. Left ventricular ejection fraction, by estimation, is 35 to 40%. The  left ventricle has moderately decreased function. The left ventricle  demonstrates global hypokinesis. There is mild left ventricular  hypertrophy of the basal-septal segment. Left  ventricular diastolic function could not be evaluated.   2. Right ventricular systolic function is mildly reduced. The right  ventricular size is normal. There is normal pulmonary artery systolic  pressure.   3. Left atrial size was severely dilated.   4. The mitral valve is degenerative. Moderate mitral valve regurgitation.  No evidence of mitral stenosis.   5. The aortic valve is tricuspid. Aortic valve regurgitation is not  visualized. Aortic valve sclerosis/calcification is present, without any  evidence of aortic stenosis.   6. Aortic dilatation noted. There is mild dilatation of the ascending  aorta, measuring 39 mm.   7. The inferior vena cava is normal in size with greater  than 50%  respiratory variability, suggesting right atrial pressure of 3 mmHg.   Cath 11/02/2023: Single vessel obstructive disease involving the PDA. This is not significantly changed from 2009. Widely patent LAD stent. Otherwise nonobstructive disease Normal LV filling pressures. LVEDP 11 mm Hg. PCWP 13/14 with mean 14 mm Hg Mild pulmonary HTN. PAP 43/21 mean 26 mm Hg. RA pressure normal 6 mm Hg Cardiac output 4.2 L/min, index 2.23.    Plan: recommend optimizing CHF therapy.     Lynwood Geofm RIGGERS [facility] Short Stay Center/Anesthesiology Phone [phone] 09/04/2024 3:43 PM

## 2024-09-04 NOTE — Anesthesia Preprocedure Evaluation (Signed)
 Anesthesia Evaluation  Patient identified by MRN, date of birth, ID band Patient awake    Reviewed: Allergy & Precautions, H&P , NPO status , Patient's Chart, lab work & pertinent test results  Airway Mallampati: II  TM Distance: >3 FB Neck ROM: Full    Dental no notable dental hx. (+) Teeth Intact, Dental Advisory Given   Pulmonary neg pulmonary ROS   Pulmonary exam normal breath sounds clear to auscultation       Cardiovascular Exercise Tolerance: Good hypertension, Pt. on medications + CAD and + Past MI  Normal cardiovascular exam Rhythm:Regular Rate:Normal     Neuro/Psych negative neurological ROS  negative psych ROS   GI/Hepatic negative GI ROS, Neg liver ROS,,,  Endo/Other  negative endocrine ROS    Renal/GU negative Renal ROS  negative genitourinary   Musculoskeletal negative musculoskeletal ROS (+)    Abdominal   Peds negative pediatric ROS (+)  Hematology negative hematology ROS (+)   Anesthesia Other Findings   Reproductive/Obstetrics negative OB ROS                              Anesthesia Physical Anesthesia Plan  ASA: 3  Anesthesia Plan: MAC   Post-op Pain Management: Minimal or no pain anticipated and Ofirmev  IV (intra-op)*   Induction: Intravenous  PONV Risk Score and Plan: 1 and Propofol  infusion  Airway Management Planned: Mask, Natural Airway and Simple Face Mask  Additional Equipment: None  Intra-op Plan:   Post-operative Plan:   Informed Consent: I have reviewed the patients History and Physical, chart, labs and discussed the procedure including the risks, benefits and alternatives for the proposed anesthesia with the patient or authorized representative who has indicated his/her understanding and acceptance.       Plan Discussed with: Anesthesiologist and CRNA  Anesthesia Plan Comments: (PAT note by Lynwood Hope, PA-C: 89 year old male follows  with cardiology for history of HTN, HLD, anterior MI 2009 treated with DES to mid LAD, chronic combined heart failure, atrial fibrillation on Xarelto .  Cath 11/02/2023 showed widely patent LAD stent, single-vessel obstructive disease involving the PDA not significantly changed from 2009, otherwise nonobstructive disease-medical therapy recommended.  Echo 07/30/2024 showed stable LVEF 35 to 40%, RV systolic function mildly reduced, moderate mitral regurgitation. Last seen by Dr. Swaziland 08/20/2024, stable from cardiac standpoint.  Upcoming surgery discussed.  Per note, Vertebral compression fracture. Plan vertebroplasty this Friday. Hold Xarelto  after Tuesday for procedure.  Preop labs reviewed, creatinine mildly elevated 1.35, otherwise unremarkable.   EKG 05/22/2024: Atrial fibrillation.  Rate 72. Left axis deviation. Septal infarct (cited on or before 22-May-2024). Inferior infarct , age undetermined  TTE 07/30/2024: 1. Left ventricular ejection fraction, by estimation, is 35 to 40%. The  left ventricle has moderately decreased function. The left ventricle  demonstrates global hypokinesis. There is mild left ventricular  hypertrophy of the basal-septal segment. Left  ventricular diastolic function could not be evaluated.  2. Right ventricular systolic function is mildly reduced. The right  ventricular size is normal. There is normal pulmonary artery systolic  pressure.  3. Left atrial size was severely dilated.  4. The mitral valve is degenerative. Moderate mitral valve regurgitation.  No evidence of mitral stenosis.  5. The aortic valve is tricuspid. Aortic valve regurgitation is not  visualized. Aortic valve sclerosis/calcification is present, without any  evidence of aortic stenosis.  6. Aortic dilatation noted. There is mild dilatation of the ascending  aorta, measuring 39  mm.  7. The inferior vena cava is normal in size with greater than 50%  respiratory variability, suggesting right  atrial pressure of 3 mmHg.   Cath 11/02/2023: 1. Single vessel obstructive disease involving the PDA. This is not significantly changed from 2009. Widely patent LAD stent. Otherwise nonobstructive disease 2. Normal LV filling pressures. LVEDP 11 mm Hg. PCWP 13/14 with mean 14 mm Hg 3. Mild pulmonary HTN. PAP 43/21 mean 26 mm Hg. RA pressure normal 6 mm Hg 4. Cardiac output 4.2 L/min, index 2.23.   Plan: recommend optimizing CHF therapy.   )         Anesthesia Quick Evaluation

## 2024-09-06 ENCOUNTER — Other Ambulatory Visit: Payer: Self-pay

## 2024-09-06 ENCOUNTER — Encounter (HOSPITAL_COMMUNITY): Payer: Self-pay | Admitting: Hospitalist

## 2024-09-06 ENCOUNTER — Inpatient Hospital Stay (HOSPITAL_COMMUNITY)
Admission: RE | Admit: 2024-09-06 | Discharge: 2024-09-10 | DRG: 522 | Disposition: A | Discharge status: Home Health | Attending: Internal Medicine | Admitting: Internal Medicine

## 2024-09-06 ENCOUNTER — Ambulatory Visit (HOSPITAL_COMMUNITY)

## 2024-09-06 ENCOUNTER — Inpatient Hospital Stay (HOSPITAL_COMMUNITY): Admitting: Anesthesiology

## 2024-09-06 ENCOUNTER — Inpatient Hospital Stay (HOSPITAL_COMMUNITY): Payer: Self-pay | Admitting: Anesthesiology

## 2024-09-06 ENCOUNTER — Inpatient Hospital Stay (HOSPITAL_COMMUNITY): Payer: Self-pay | Admitting: Physician Assistant

## 2024-09-06 ENCOUNTER — Encounter (HOSPITAL_COMMUNITY)
Admission: RE | Disposition: A | Discharge status: Home Health | Payer: Self-pay | Source: Home / Self Care | Attending: Internal Medicine

## 2024-09-06 DIAGNOSIS — N179 Acute kidney failure, unspecified: Secondary | ICD-10-CM | POA: Diagnosis present

## 2024-09-06 DIAGNOSIS — D539 Nutritional anemia, unspecified: Secondary | ICD-10-CM | POA: Diagnosis present

## 2024-09-06 DIAGNOSIS — D62 Acute posthemorrhagic anemia: Secondary | ICD-10-CM | POA: Diagnosis not present

## 2024-09-06 DIAGNOSIS — Z8546 Personal history of malignant neoplasm of prostate: Secondary | ICD-10-CM

## 2024-09-06 DIAGNOSIS — Z955 Presence of coronary angioplasty implant and graft: Secondary | ICD-10-CM

## 2024-09-06 DIAGNOSIS — S4491XA Injury of unspecified nerve at shoulder and upper arm level, right arm, initial encounter: Principal | ICD-10-CM

## 2024-09-06 DIAGNOSIS — I252 Old myocardial infarction: Secondary | ICD-10-CM | POA: Diagnosis not present

## 2024-09-06 DIAGNOSIS — Z79899 Other long term (current) drug therapy: Secondary | ICD-10-CM

## 2024-09-06 DIAGNOSIS — M4802 Spinal stenosis, cervical region: Secondary | ICD-10-CM | POA: Diagnosis present

## 2024-09-06 DIAGNOSIS — Y9301 Activity, walking, marching and hiking: Secondary | ICD-10-CM | POA: Diagnosis present

## 2024-09-06 DIAGNOSIS — Z8249 Family history of ischemic heart disease and other diseases of the circulatory system: Secondary | ICD-10-CM | POA: Diagnosis not present

## 2024-09-06 DIAGNOSIS — W010XXA Fall on same level from slipping, tripping and stumbling without subsequent striking against object, initial encounter: Secondary | ICD-10-CM | POA: Diagnosis present

## 2024-09-06 DIAGNOSIS — I5042 Chronic combined systolic (congestive) and diastolic (congestive) heart failure: Secondary | ICD-10-CM | POA: Diagnosis present

## 2024-09-06 DIAGNOSIS — M19011 Primary osteoarthritis, right shoulder: Secondary | ICD-10-CM | POA: Diagnosis present

## 2024-09-06 DIAGNOSIS — E785 Hyperlipidemia, unspecified: Secondary | ICD-10-CM | POA: Diagnosis present

## 2024-09-06 DIAGNOSIS — Z923 Personal history of irradiation: Secondary | ICD-10-CM

## 2024-09-06 DIAGNOSIS — M25552 Pain in left hip: Secondary | ICD-10-CM | POA: Diagnosis present

## 2024-09-06 DIAGNOSIS — S72002A Fracture of unspecified part of neck of left femur, initial encounter for closed fracture: Secondary | ICD-10-CM | POA: Diagnosis present

## 2024-09-06 DIAGNOSIS — G252 Other specified forms of tremor: Secondary | ICD-10-CM | POA: Diagnosis not present

## 2024-09-06 DIAGNOSIS — M19031 Primary osteoarthritis, right wrist: Secondary | ICD-10-CM | POA: Diagnosis present

## 2024-09-06 DIAGNOSIS — M545 Low back pain, unspecified: Secondary | ICD-10-CM | POA: Diagnosis present

## 2024-09-06 DIAGNOSIS — R29898 Other symptoms and signs involving the musculoskeletal system: Secondary | ICD-10-CM | POA: Diagnosis not present

## 2024-09-06 DIAGNOSIS — G8929 Other chronic pain: Secondary | ICD-10-CM | POA: Diagnosis present

## 2024-09-06 DIAGNOSIS — Z01818 Encounter for other preprocedural examination: Secondary | ICD-10-CM | POA: Diagnosis not present

## 2024-09-06 DIAGNOSIS — I10 Essential (primary) hypertension: Secondary | ICD-10-CM | POA: Diagnosis not present

## 2024-09-06 DIAGNOSIS — Z7984 Long term (current) use of oral hypoglycemic drugs: Secondary | ICD-10-CM

## 2024-09-06 DIAGNOSIS — Z7901 Long term (current) use of anticoagulants: Secondary | ICD-10-CM

## 2024-09-06 DIAGNOSIS — I4819 Other persistent atrial fibrillation: Secondary | ICD-10-CM | POA: Diagnosis present

## 2024-09-06 DIAGNOSIS — I110 Hypertensive heart disease with heart failure: Secondary | ICD-10-CM | POA: Diagnosis present

## 2024-09-06 DIAGNOSIS — M810 Age-related osteoporosis without current pathological fracture: Secondary | ICD-10-CM | POA: Diagnosis present

## 2024-09-06 DIAGNOSIS — D696 Thrombocytopenia, unspecified: Secondary | ICD-10-CM | POA: Diagnosis not present

## 2024-09-06 DIAGNOSIS — D519 Vitamin B12 deficiency anemia, unspecified: Secondary | ICD-10-CM | POA: Diagnosis present

## 2024-09-06 DIAGNOSIS — S72002D Fracture of unspecified part of neck of left femur, subsequent encounter for closed fracture with routine healing: Secondary | ICD-10-CM | POA: Diagnosis not present

## 2024-09-06 DIAGNOSIS — M25422 Effusion, left elbow: Secondary | ICD-10-CM | POA: Diagnosis present

## 2024-09-06 DIAGNOSIS — I509 Heart failure, unspecified: Secondary | ICD-10-CM | POA: Diagnosis not present

## 2024-09-06 DIAGNOSIS — I2510 Atherosclerotic heart disease of native coronary artery without angina pectoris: Secondary | ICD-10-CM | POA: Diagnosis present

## 2024-09-06 DIAGNOSIS — I482 Chronic atrial fibrillation, unspecified: Secondary | ICD-10-CM | POA: Diagnosis not present

## 2024-09-06 DIAGNOSIS — M47812 Spondylosis without myelopathy or radiculopathy, cervical region: Secondary | ICD-10-CM | POA: Diagnosis present

## 2024-09-06 DIAGNOSIS — I5022 Chronic systolic (congestive) heart failure: Secondary | ICD-10-CM

## 2024-09-06 HISTORY — PX: KYPHOPLASTY: SHX5884

## 2024-09-06 LAB — CBC
HCT: 45.2 % (ref 39.0–52.0)
Hemoglobin: 14.7 g/dL (ref 13.0–17.0)
MCH: 32.7 pg (ref 26.0–34.0)
MCHC: 32.5 g/dL (ref 30.0–36.0)
MCV: 100.7 fL — ABNORMAL HIGH (ref 80.0–100.0)
Platelets: 142 K/uL — ABNORMAL LOW (ref 150–400)
RBC: 4.49 MIL/uL (ref 4.22–5.81)
RDW: 13.5 % (ref 11.5–15.5)
WBC: 11.3 K/uL — ABNORMAL HIGH (ref 4.0–10.5)
nRBC: 0 % (ref 0.0–0.2)

## 2024-09-06 LAB — ABO/RH: ABO/RH(D): O POS

## 2024-09-06 LAB — TYPE AND SCREEN
ABO/RH(D): O POS
Antibody Screen: NEGATIVE

## 2024-09-06 LAB — CREATININE, SERUM
Creatinine, Ser: 1.01 mg/dL (ref 0.61–1.24)
GFR, Estimated: >60 mL/min (ref >60)

## 2024-09-06 SURGERY — KYPHOPLASTY
Anesthesia: Monitor Anesthesia Care

## 2024-09-06 MED ORDER — LACTATED RINGERS IV SOLN: INTRAVENOUS | Status: DC

## 2024-09-06 MED ORDER — OXYCODONE HCL 5 MG PO TABS: 5.0000 mg | ORAL_TABLET | ORAL | Status: DC | PRN

## 2024-09-06 MED ORDER — ACETAMINOPHEN 500 MG PO TABS
1000.0000 mg | ORAL_TABLET | Freq: Three times a day (TID) | ORAL | Status: DC
  Administered 2024-09-06 – 2024-09-10 (×11): 1000 mg via ORAL
  Filled 2024-09-06 (×11): qty 2

## 2024-09-06 MED ORDER — ENOXAPARIN SODIUM 40 MG/0.4ML IJ SOSY
40.0000 mg | PREFILLED_SYRINGE | INTRAMUSCULAR | Status: DC
  Administered 2024-09-08 – 2024-09-10 (×3): 40 mg via SUBCUTANEOUS
  Filled 2024-09-06 (×3): qty 0.4

## 2024-09-06 MED ORDER — BUPIVACAINE-EPINEPHRINE (PF) 0.5% -1:200000 IJ SOLN
INTRAMUSCULAR | Status: DC | PRN
  Administered 2024-09-06: 25 mL via PERINEURAL

## 2024-09-06 MED ORDER — METHOCARBAMOL 500 MG PO TABS
500.0000 mg | ORAL_TABLET | Freq: Three times a day (TID) | ORAL | Status: DC
  Administered 2024-09-06 – 2024-09-10 (×12): 500 mg via ORAL
  Filled 2024-09-06 (×11): qty 1

## 2024-09-06 MED ORDER — FENTANYL CITRATE PF 50 MCG/ML IJ SOSY
50.0000 ug | PREFILLED_SYRINGE | Freq: Once | INTRAMUSCULAR | Status: AC
  Administered 2024-09-06: 50 ug via INTRAVENOUS

## 2024-09-06 MED ORDER — ACETAMINOPHEN 500 MG PO TABS
1000.0000 mg | ORAL_TABLET | Freq: Once | ORAL | Status: AC
Start: 2024-09-06 — End: 2024-09-06

## 2024-09-06 MED ORDER — FENTANYL CITRATE (PF) 100 MCG/2ML IJ SOLN
INTRAMUSCULAR | Status: AC
  Filled 2024-09-06: qty 2

## 2024-09-06 MED ORDER — ORAL CARE MOUTH RINSE: 15.0000 mL | Freq: Once | OROMUCOSAL | Status: AC

## 2024-09-06 MED ORDER — VITAMIN D 25 MCG (1000 UNIT) PO TABS
2000.0000 [IU] | ORAL_TABLET | Freq: Every evening | ORAL | Status: DC
  Administered 2024-09-06 – 2024-09-09 (×4): 2000 [IU] via ORAL
  Filled 2024-09-06 (×4): qty 2

## 2024-09-06 MED ORDER — METHOCARBAMOL 500 MG PO TABS
ORAL_TABLET | ORAL | Status: AC
  Filled 2024-09-06: qty 1

## 2024-09-06 MED ORDER — TRANEXAMIC ACID-NACL 1000-0.7 MG/100ML-% IV SOLN
1000.0000 mg | INTRAVENOUS | Status: DC
  Filled 2024-09-06: qty 100

## 2024-09-06 MED ORDER — DAPAGLIFLOZIN PROPANEDIOL 10 MG PO TABS
10.0000 mg | ORAL_TABLET | Freq: Every day | ORAL | Status: DC
  Administered 2024-09-08 – 2024-09-10 (×3): 10 mg via ORAL
  Filled 2024-09-06 (×5): qty 1

## 2024-09-06 MED ORDER — SIMVASTATIN 20 MG PO TABS
40.0000 mg | ORAL_TABLET | Freq: Every day | ORAL | Status: DC
  Administered 2024-09-06 – 2024-09-09 (×4): 40 mg via ORAL
  Filled 2024-09-06 (×4): qty 2

## 2024-09-06 MED ORDER — CHLORHEXIDINE GLUCONATE 0.12 % MT SOLN
15.0000 mL | Freq: Once | OROMUCOSAL | Status: AC
  Administered 2024-09-06: 15 mL via OROMUCOSAL
  Filled 2024-09-06: qty 15

## 2024-09-06 MED ORDER — ACETAMINOPHEN 500 MG PO TABS
ORAL_TABLET | ORAL | Status: AC
  Administered 2024-09-06: 1000 mg via ORAL
  Filled 2024-09-06: qty 2

## 2024-09-06 MED ORDER — CEFAZOLIN SODIUM-DEXTROSE 2-4 GM/100ML-% IV SOLN
2.0000 g | INTRAVENOUS | Status: DC
  Filled 2024-09-06: qty 100

## 2024-09-06 MED ORDER — CHLORHEXIDINE GLUCONATE CLOTH 2 % EX PADS
6.0000 | MEDICATED_PAD | Freq: Every day | CUTANEOUS | Status: DC
  Administered 2024-09-07 – 2024-09-08 (×2): 6 via TOPICAL

## 2024-09-06 MED ORDER — MUPIROCIN 2 % EX OINT
1.0000 | TOPICAL_OINTMENT | Freq: Two times a day (BID) | CUTANEOUS | Status: DC
  Administered 2024-09-06 – 2024-09-10 (×8): 1 via NASAL
  Filled 2024-09-06 (×3): qty 22

## 2024-09-06 MED ORDER — GABAPENTIN 300 MG PO CAPS
300.0000 mg | ORAL_CAPSULE | Freq: Three times a day (TID) | ORAL | Status: DC
  Administered 2024-09-06 – 2024-09-10 (×11): 300 mg via ORAL
  Filled 2024-09-06 (×11): qty 1

## 2024-09-06 MED ORDER — OXYCODONE HCL 5 MG PO TABS
5.0000 mg | ORAL_TABLET | Freq: Once | ORAL | Status: AC
  Administered 2024-09-06: 5 mg via ORAL
  Filled 2024-09-06: qty 1

## 2024-09-06 SURGICAL SUPPLY — 33 items
BAG COUNTER SPONGE SURGICOUNT (BAG) IMPLANT
BLADE SURG 15 STRL LF DISP TIS (BLADE) ×1 IMPLANT
BNDG ADH 1X3 SHEER STRL LF (GAUZE/BANDAGES/DRESSINGS) ×2 IMPLANT
COVER MAYO STAND STRL (DRAPES) ×1 IMPLANT
COVER SURGICAL LIGHT HANDLE (MISCELLANEOUS) ×1 IMPLANT
CURETTE EXPRESS SZ2 7MM (INSTRUMENTS) IMPLANT
CURETTE WEDGE 8.5MM KYPHX (MISCELLANEOUS) IMPLANT
DERMABOND ADVANCED .7 DNX12 (GAUZE/BANDAGES/DRESSINGS) ×1 IMPLANT
DRAPE C-ARM 42X72 X-RAY (DRAPES) ×2 IMPLANT
DRAPE INCISE IOBAN 66X45 STRL (DRAPES) ×1 IMPLANT
DRAPE LAPAROTOMY T 102X78X121 (DRAPES) ×1 IMPLANT
DRAPE WARM FLUID 44X44 (DRAPES) ×1 IMPLANT
DURAPREP 26ML APPLICATOR (WOUND CARE) ×1 IMPLANT
GLOVE BIO SURGEON STRL SZ 6.5 (GLOVE) ×1 IMPLANT
GLOVE BIOGEL PI IND STRL 6.5 (GLOVE) ×1 IMPLANT
GLOVE BIOGEL PI IND STRL 8.5 (GLOVE) IMPLANT
GLOVE SS BIOGEL STRL SZ 8.5 (GLOVE) ×1 IMPLANT
GOWN STRL REUS W/ TWL LRG LVL3 (GOWN DISPOSABLE) ×2 IMPLANT
GOWN STRL REUS W/TWL 2XL LVL3 (GOWN DISPOSABLE) ×1 IMPLANT
KIT BASIN OR (CUSTOM PROCEDURE TRAY) ×1 IMPLANT
KIT TURNOVER KIT B (KITS) ×1 IMPLANT
NEEDLE HYPO 22X1.5 SAFETY MO (MISCELLANEOUS) ×1 IMPLANT
NEEDLE SPNL 22GX3.5 QUINCKE BK (NEEDLE) ×1 IMPLANT
NS IRRIG 1000ML POUR BTL (IV SOLUTION) ×1 IMPLANT
PACK SRG BSC III STRL LF ECLPS (CUSTOM PROCEDURE TRAY) ×1 IMPLANT
PAD ARMBOARD POSITIONER FOAM (MISCELLANEOUS) ×2 IMPLANT
SPONGE T-LAP 4X18 ~~LOC~~+RFID (SPONGE) ×1 IMPLANT
SUT MNCRL AB 3-0 PS2 18 (SUTURE) ×1 IMPLANT
SYR CONTROL 10ML LL (SYRINGE) ×1 IMPLANT
TOWEL GREEN STERILE (TOWEL DISPOSABLE) ×1 IMPLANT
TRAY KYPHOPAK 15/3 ONESTEP 1ST (MISCELLANEOUS) IMPLANT
TRAY KYPHOPAK 20/3 ONESTEP 1ST (MISCELLANEOUS) IMPLANT
WATER STERILE IRR 1000ML POUR (IV SOLUTION) ×1 IMPLANT

## 2024-09-06 NOTE — Consult Note (Addendum)
 Cardiology Consultation   Patient ID: JACQUEL MCCAMISH MRN: [id number]; DOB: 1933/09/16  Admit date: 09/06/2024 Date of Consult: 09/06/2024  PCP:  [name], MD   [facility] Providers Cardiologist:  Peter Swaziland, MD    Patient Profile: JOHAAN RYSER is a 89 y.o. male with a hx of CAD s/p DES to LAD '09, persistent atrial fibrillation, hypertension, hyperlipidemia, chronic combined CHF who is being seen 09/06/2024 for the evaluation of preop evaluation at the request of Dr. Georgina.  History of Present Illness: Mr. Mollenkopf is an 89 year old male with past medical history noted above.  He has been followed by Dr. Swaziland as an outpatient.  History of anterior MI in November 2009 treated with DES to mid LAD.  In November 2014 he was found to be in atrial fibrillation with controlled ventricular response.  He was started on Xarelto  and echo at that time showed LVEF of 40 to 45% with moderate biatrial enlargement.  Admitted 08/2023 with community-acquired pneumonia and echocardiogram done during that admission showed LVEF of 35 to 40%, moderate pulmonary hypertension with RV systolic pressure of 54 mmHg, severe biatrial enlargement, moderate MR.  He underwent cardiac PET which was interpreted as high risk and ultimately underwent cardiac catheterization 10/2023 with single-vessel obstructive disease in the PDA which was not significantly changed from cath in 2009, moderate pulmonary hypertension, CO 4.2, CI 2.23.  Last seen in the office 07/2024 and reported having upcoming vertebral plasty with Dr. Burnetta for a L5 compression fracture.  He was continued on Entresto , spironolactone , Farxiga .  Metoprolol  previously discontinued secondary to fatigue.  Cleared for surgery at that time.  Presented for outpatient lumbar kyphoplasty on 9/11 and unfortunately while walking towards the preop holding area tripped and fell onto his left hip.  Imaging notable for left femoral neck fracture.  He was  admitted to internal medicine for further management and cardiology asked for preoperative evaluation prior to surgery.  In talking with patient he has not had any anginal symptoms, shortness of breath or other concerning symptoms prior to presentation today.  Actually reports that he has picked up his activity level over the past couple of weeks and was walking quite a bit more.  Xarelto  already on hold for initial plans for kyphoplasty today.   Past Medical History:  Diagnosis Date   Atrial fibrillation [facility])    Coronary artery disease    Nov 2009-MI-stenting of the mid LAD drug-eluding stent   Hx of radiation therapy 05/15/14- 07/11/14   prostate 7800 cGy 40 sessions, seminal vesicles 5600 cGy 40 sessions   Hyperlipidemia    Hypertension    LV dysfunction    MI (myocardial infarction) (HCC) 1998   Osteopenia    Prostate CA (HCC) 05/05/2009   prostate cancer 11/09-tx with cryotherapy, Dr Chales    Past Surgical History:  Procedure Laterality Date   CARDIAC CATHETERIZATION     Nov 2009-drug eluding stent to mid LAD   HERNIA REPAIR     inguinal   IR KYPHO EA ADDL LEVEL THORACIC OR LUMBAR  04/28/2023   IR KYPHO LUMBAR INC FX REDUCE BONE BX UNI/BIL CANNULATION INC/IMAGING  04/28/2023   PROSTATE BIOPSY  09/25/2008   gleason 4+3=7   PROSTATE CRYOABLATION  05/05/2009   Dr Chales   RIGHT/LEFT HEART CATH AND CORONARY ANGIOGRAPHY N/A 11/02/2023   Procedure: RIGHT/LEFT HEART CATH AND CORONARY ANGIOGRAPHY;  Surgeon: Swaziland, Peter M, MD;  Location: [facility] INVASIVE CV LAB;  Service: Cardiovascular;  Laterality: N/A;  Scheduled Meds:  acetaminophen   1,000 mg Oral TID   Chlorhexidine  Gluconate Cloth  6 each Topical Daily   cholecalciferol   2,000 Units Oral QPM   dapagliflozin  propanediol  10 mg Oral QAC breakfast   enoxaparin  (LOVENOX ) injection  40 mg Subcutaneous Q24H   gabapentin   300 mg Oral TID   methocarbamol   500 mg Oral TID   mupirocin  ointment  1 Application Nasal BID    simvastatin   40 mg Oral QHS   Continuous Infusions:  PRN Meds: oxyCODONE   Allergies:   No Known Allergies  Social History:   Social History   Socioeconomic History   Marital status: Married    Spouse name: Not on file   Number of children: 1   Years of education: Not on file   Highest education level: Not on file  Occupational History   Occupation: executive    Employer: RETIRED  Tobacco Use   Smoking status: Never   Smokeless tobacco: Never  Vaping Use   Vaping status: Never Used  Substance and Sexual Activity   Alcohol use: No   Drug use: No   Sexual activity: Not on file  Other Topics Concern   Not on file  Social History Narrative   Not on file   Social Drivers of Health   Financial Resource Strain: Not on file  Food Insecurity: No Food Insecurity (09/06/2024)   Hunger Vital Sign    Worried About Running Out of Food in the Last Year: Never true    Ran Out of Food in the Last Year: Never true  Transportation Needs: No Transportation Needs (09/06/2024)   PRAPARE - Administrator, Civil Service (Medical): No    Lack of Transportation (Non-Medical): No  Physical Activity: Not on file  Stress: Not on file  Social Connections: Unknown (09/06/2024)   Social Connection and Isolation Panel    Frequency of Communication with Friends and Family: Never    Frequency of Social Gatherings with Friends and Family: Never    Attends Religious Services: Never    Database administrator or Organizations: No    Attends Banker Meetings: Never    Marital Status: Not on file  Intimate Partner Violence: Not At Risk (09/06/2024)   Humiliation, Afraid, Rape, and Kick questionnaire    Fear of Current or Ex-Partner: No    Emotionally Abused: No    Physically Abused: No    Sexually Abused: No    Family History:    Family History  Problem Relation Age of Onset   Heart disease Mother    Heart failure Mother    Heart disease Brother    Hematuria Father     Cancer Father        prostate   Heart disease Sister      ROS:  Please see the history of present illness.   All other ROS reviewed and negative.     Physical Exam/Data: Vitals:   09/06/24 0609 09/06/24 0630 09/06/24 1213  BP: (!) 155/110 (!) 163/98 (!) 130/96  Pulse: 82  (!) 59  Resp: 20  17  Temp: 97.7 F (36.5 C)  (!) 97.5 F (36.4 C)  TempSrc: Oral    SpO2: 100%  96%  Weight: 67.1 kg    Height: 5' 7 (1.702 m)     No intake or output data in the 24 hours ending 09/06/24 1336    09/06/2024    6:09 AM 09/03/2024    1:43 PM 08/20/2024  9:14 AM  Last 3 Weights  Weight (lbs) 148 lb 147 lb 3.2 oz 148 lb  Weight (kg) 67.132 kg 66.769 kg 67.132 kg     Body mass index is 23.18 kg/m.  General:  Well nourished, well developed, in no acute distress HEENT: normal Neck: no JVD Vascular: No carotid bruits; Distal pulses 2+ bilaterally Cardiac:  normal S1, S2; irregularly irregular; no murmur  Lungs:  clear to auscultation bilaterally, no wheezing, rhonchi or rales  Abd: soft, nontender, no hepatomegaly  Ext: no edema Musculoskeletal:  No deformities, BUE and BLE strength normal and equal Skin: warm and dry  Neuro:  no focal abnormalities noted Psych:  Normal affect   EKG:  The EKG was personally reviewed and demonstrates:  N/a Telemetry:  Telemetry was personally reviewed and demonstrates:  N/a   Relevant CV Studies:  Echo: 07/2024  IMPRESSIONS     1. Left ventricular ejection fraction, by estimation, is 35 to 40%. The  left ventricle has moderately decreased function. The left ventricle  demonstrates global hypokinesis. There is mild left ventricular  hypertrophy of the basal-septal segment. Left  ventricular diastolic function could not be evaluated.   2. Right ventricular systolic function is mildly reduced. The right  ventricular size is normal. There is normal pulmonary artery systolic  pressure.   3. Left atrial size was severely dilated.   4. The mitral  valve is degenerative. Moderate mitral valve regurgitation.  No evidence of mitral stenosis.   5. The aortic valve is tricuspid. Aortic valve regurgitation is not  visualized. Aortic valve sclerosis/calcification is present, without any  evidence of aortic stenosis.   6. Aortic dilatation noted. There is mild dilatation of the ascending  aorta, measuring 39 mm.   7. The inferior vena cava is normal in size with greater than 50%  respiratory variability, suggesting right atrial pressure of 3 mmHg.   FINDINGS   Left Ventricle: Left ventricular ejection fraction, by estimation, is 35  to 40%. The left ventricle has moderately decreased function. The left  ventricle demonstrates global hypokinesis. The left ventricular internal  cavity size was normal in size.  There is mild left ventricular hypertrophy of the basal-septal segment.  Left ventricular diastolic function could not be evaluated due to atrial  fibrillation. Left ventricular diastolic function could not be evaluated.   Right Ventricle: The right ventricular size is normal. No increase in  right ventricular wall thickness. Right ventricular systolic function is  mildly reduced. There is normal pulmonary artery systolic pressure. The  tricuspid regurgitant velocity is 2.26  m/s, and with an assumed right atrial pressure of 3 mmHg, the estimated  right ventricular systolic pressure is 23.4 mmHg.   Left Atrium: Left atrial size was severely dilated.   Right Atrium: Right atrial size was normal in size.   Pericardium: There is no evidence of pericardial effusion.   Mitral Valve: The mitral valve is degenerative in appearance. There is  mild thickening of the mitral valve leaflet(s). There is mild  calcification of the mitral valve leaflet(s). Moderate mitral valve  regurgitation. No evidence of mitral valve stenosis.   Tricuspid Valve: The tricuspid valve is normal in structure. Tricuspid  valve regurgitation is trivial. No  evidence of tricuspid stenosis.   Aortic Valve: The aortic valve is tricuspid. Aortic valve regurgitation is  not visualized. Aortic valve sclerosis/calcification is present, without  any evidence of aortic stenosis.   Pulmonic Valve: The pulmonic valve was normal in structure. Pulmonic valve  regurgitation  is trivial. No evidence of pulmonic stenosis.   Aorta: Aortic dilatation noted. There is mild dilatation of the ascending  aorta, measuring 39 mm.   Venous: The inferior vena cava is normal in size with greater than 50%  respiratory variability, suggesting right atrial pressure of 3 mmHg.   IAS/Shunts: The interatrial septum appears to be lipomatous. No atrial  level shunt detected by color flow Doppler.       Laboratory Data: High Sensitivity Troponin:  No results for input(s): TROPONINIHS in the last 720 hours.   Chemistry Recent Labs  Lab 09/03/24 1444  NA 144  K 4.6  CL 107  CO2 26  GLUCOSE 114*  BUN 17  CREATININE 1.35*  CALCIUM 9.8  GFRNONAA 50*  ANIONGAP 11    No results for input(s): PROT, ALBUMIN , AST, ALT, ALKPHOS, BILITOT in the last 168 hours. Lipids No results for input(s): CHOL, TRIG, HDL, LABVLDL, LDLCALC, CHOLHDL in the last 168 hours.  Hematology Recent Labs  Lab 09/03/24 1444 09/06/24 1221  WBC 7.8 11.3*  RBC 4.59 4.49  HGB 15.3 14.7  HCT 47.1 45.2  MCV 102.6* 100.7*  MCH 33.3 32.7  MCHC 32.5 32.5  RDW 13.6 13.5  PLT 151 142*   Thyroid No results for input(s): TSH, FREET4 in the last 168 hours.  BNPNo results for input(s): BNP, PROBNP in the last 168 hours.  DDimer No results for input(s): DDIMER in the last 168 hours.  Radiology/Studies:  X-ray pelvis complete Result Date: 09/06/2024 EXAM: 1 or 2 VIEW(S) XRAY OF THE PELVIS 09/06/2024 06:36:00 AM COMPARISON: None available. CLINICAL HISTORY: Fall 190176. Reason for exam: fall; Per progress notes: Patient upon entering department had a fall and landed  on L hip. Daughter and [place], [place] walking with patient. Dr. Donaciano Sprang notified @ 0600 and order for pelvic xray received. FINDINGS: BONES AND JOINTS: Acute fracture of left femoral neck with proximal migration of distal femur. SOFT TISSUES: Vascular calcifications. IMPRESSION: 1. Acute fracture of left femoral neck with proximal migration of distal femur. Electronically signed by: Waddell Calk MD 09/06/2024 07:01 AM EDT RP Workstation: [id number]     Assessment and Plan:  SRIRAM FEBLES is a 89 y.o. male with a hx of CAD s/p DES to LAD '09, persistent atrial fibrillation, hypertension, hyperlipidemia, chronic combined CHF who is being seen 09/06/2024 for the evaluation of preop evaluation at the request of Dr. Georgina.  Preop evaluation Fall w/left hip fracture  -- Presented initially today for kyphoplasty when he had a mechanical fall in the preop area landing on his left hip.  X-rays confirmed left hip fracture.  He is now planned for surgery tomorrow with Dr. Kendal.  He was recently seen in the office on 07/2024 with Dr. Swaziland and doing well from a cardiac standpoint.  He has continued to do well and actually increased his physical activity over the past couple weeks without any anginal symptoms or shortness of breath.   -- RCRI % 0.9.  Xarelto  has already been held in anticipation for initial plans for kyphoplasty today. -- Will obtain preop EKG. would recommend monitoring volume status closely in the perioperative period given known history of heart failure -- Do not anticipate any further workup at this time  CAD s/p DES to LAD '09 -- no anginal symptoms -- on statin therapy, not  on ASA with need for [facility]  Chronic combined CHF -- Echo 07/30/2024 LVEF of 35 to 40%, global hypokinesis, mildly reduced RV, normal PA pressure,  severe left atrial enlargement, moderate MR -- Well compensated on exam -- GDMT: Has not tolerated beta-blocker therapy in the past secondary to fatigue.  PTA meds  include Entresto  97-103 mg twice daily, spironolactone  12.5 mg, Farxiga  10 mg daily.  Would resume postoperatively as appropriate  Risk Assessment/Risk Scores:   CHA2DS2-VASc Score = 5   This indicates a 7.2% annual risk of stroke. The patient's score is based upon: CHF History: 1 HTN History: 1 Diabetes History: 0 Stroke History: 0 Vascular Disease History: 1 Age Score: 2 Gender Score: 0   For questions or updates, please contact [facility] Please consult [email or web address] for contact info under    Signed, Manuelita Rummer, NP  09/06/2024 1:36 PM  Patient seen and examined  I agree with findings as noted by L Rummer above    Pt  is an 89 yo who is active for age  Hx of CAD, Atrial fibrillation HFrEF     Note PET CT in Oct 2024 showed no ischemia Scar Echo Aug 2025  LVEF 35 to 40%  RV function mildly depressed   Presented today for back surgery, fell and broke his hip  Pt denies CP  Breathing has been good   Walked 1.5 miles per day   no SOB  On exam Lungs are CTA  Cardiac  Irreg irreg  No S3 No murmurs Abd is benign Ext   No LE edema   Impression From a cardiac standpoint he is at he is at relatively low risk for major cardiac event with planned surgery    Most from volume standpoint   OK to proceed without further testing     Keep on tele through periop period    WIll continue to follow   Vina Gull MD

## 2024-09-06 NOTE — Consult Note (Signed)
 Reason for Consult:Left hip fx Referring Physician: Donaciano Sprang Time called: 0745 Time at bedside: 0903   [name] is an 89 y.o. male.  HPI: Karon was coming in this morning for kyphoplasty by Dr. Sprang. While in pre-op holding walking to his bed he tripped and fell. He had immediate left hip pain and could not get up. X-rays showed a left hip fx. Kyphoplasty was cancelled.  Past Medical History:  Diagnosis Date   Atrial fibrillation [facility])    Coronary artery disease    Nov 2009-MI-stenting of the mid LAD drug-eluding stent   Hx of radiation therapy 05/15/14- 07/11/14   prostate 7800 cGy 40 sessions, seminal vesicles 5600 cGy 40 sessions   Hyperlipidemia    Hypertension    LV dysfunction    MI (myocardial infarction) (HCC) 1998   Osteopenia    Prostate CA (HCC) 05/05/2009   prostate cancer 11/09-tx with cryotherapy, Dr Chales    Past Surgical History:  Procedure Laterality Date   CARDIAC CATHETERIZATION     Nov 2009-drug eluding stent to mid LAD   HERNIA REPAIR     inguinal   IR KYPHO EA ADDL LEVEL THORACIC OR LUMBAR  04/28/2023   IR KYPHO LUMBAR INC FX REDUCE BONE BX UNI/BIL CANNULATION INC/IMAGING  04/28/2023   PROSTATE BIOPSY  09/25/2008   gleason 4+3=7   PROSTATE CRYOABLATION  05/05/2009   Dr Chales   RIGHT/LEFT HEART CATH AND CORONARY ANGIOGRAPHY N/A 11/02/2023   Procedure: RIGHT/LEFT HEART CATH AND CORONARY ANGIOGRAPHY;  Surgeon: Swaziland, Peter M, MD;  Location: [facility] INVASIVE CV LAB;  Service: Cardiovascular;  Laterality: N/A;    Family History  Problem Relation Age of Onset   Heart disease Mother    Heart failure Mother    Heart disease Brother    Hematuria Father    Cancer Father        prostate   Heart disease Sister     Social History:  reports that he has never smoked. He has never used smokeless tobacco. He reports that he does not drink alcohol and does not use drugs.  Allergies: No Known Allergies  Medications: I have reviewed the patient's  current medications.  No results found for this or any previous visit (from the past 48 hours).  X-ray pelvis complete Result Date: 09/06/2024 EXAM: 1 or 2 VIEW(S) XRAY OF THE PELVIS 09/06/2024 06:36:00 AM COMPARISON: None available. CLINICAL HISTORY: Fall 190176. Reason for exam: fall; Per progress notes: Patient upon entering department had a fall and landed on L hip. Daughter and [place], [place] walking with patient. Dr. Donaciano Sprang notified @ 0600 and order for pelvic xray received. FINDINGS: BONES AND JOINTS: Acute fracture of left femoral neck with proximal migration of distal femur. SOFT TISSUES: Vascular calcifications. IMPRESSION: 1. Acute fracture of left femoral neck with proximal migration of distal femur. Electronically signed by: Waddell Calk MD 09/06/2024 07:01 AM EDT RP Workstation: [id number]    Review of Systems  HENT:  Negative for ear discharge, ear pain, hearing loss and tinnitus.   Eyes:  Negative for photophobia and pain.  Respiratory:  Negative for cough and shortness of breath.   Cardiovascular:  Negative for chest pain.  Gastrointestinal:  Negative for abdominal pain, nausea and vomiting.  Genitourinary:  Negative for dysuria, flank pain, frequency and urgency.  Musculoskeletal:  Positive for arthralgias (Left hip) and back pain. Negative for myalgias and neck pain.  Neurological:  Negative for dizziness and headaches.  Hematological:  Does not bruise/bleed  easily.  Psychiatric/Behavioral:  The patient is not nervous/anxious.    Blood pressure (!) 163/98, pulse 82, temperature 97.7 F (36.5 C), temperature source Oral, resp. rate 20, height 5' 7 (1.702 m), weight 67.1 kg, SpO2 100%. Physical Exam Constitutional:      General: He is not in acute distress.    Appearance: He is well-developed. He is not diaphoretic.  HENT:     Head: Normocephalic and atraumatic.  Eyes:     General: No scleral icterus.       Right eye: No discharge.        Left eye: No  discharge.     Conjunctiva/sclera: Conjunctivae normal.  Cardiovascular:     Rate and Rhythm: Normal rate and regular rhythm.  Pulmonary:     Effort: Pulmonary effort is normal. No respiratory distress.  Musculoskeletal:     Cervical back: Normal range of motion.     Comments: LLE No traumatic wounds, ecchymosis, or rash  Mod TTP hip  No knee or ankle effusion  Knee stable to varus/ valgus and anterior/posterior stress  Sens DPN, SPN, TN intact  Motor EHL, ext, flex, evers 5/5  DP 2+, PT 2+, No significant edema  Skin:    General: Skin is warm and dry.  Neurological:     Mental Status: He is alert.  Psychiatric:        Mood and Affect: Mood normal.        Behavior: Behavior normal.     Assessment/Plan: Left hip fx -- Plan THA vs hip hemi tomorrow with Dr. Kendal. Please keep NPO after MN.    Ozell DOROTHA Ned, PA-C Orthopedic Surgery [phone] 09/06/2024, 9:19 AM

## 2024-09-06 NOTE — Anesthesia Preprocedure Evaluation (Addendum)
 Anesthesia Evaluation  Patient identified by MRN, date of birth, ID band Patient awake    Reviewed: Allergy & Precautions, Patient's Chart, lab work & pertinent test results  History of Anesthesia Complications Negative for: history of anesthetic complications  Airway Mallampati: II  TM Distance: >3 FB Neck ROM: Full    Dental no notable dental hx.    Pulmonary neg pulmonary ROS   Pulmonary exam normal        Cardiovascular hypertension, Pt. on medications + CAD, + Past MI and + Cardiac Stents (2009)  Normal cardiovascular exam+ dysrhythmias (on Xarelto ) Atrial Fibrillation   Echo 07/30/2024: LVEF 35 to 40%, RV systolic function mildly reduced, moderate mitral regurgitation   Neuro/Psych negative neurological ROS     GI/Hepatic negative GI ROS, Neg liver ROS,,,  Endo/Other  negative endocrine ROS    Renal/GU negative Renal ROS     Musculoskeletal Acute fracture of left femoral neck with proximal migration of distal femur   Abdominal   Peds  Hematology negative hematology ROS (+)   Anesthesia Other Findings   Reproductive/Obstetrics                              Anesthesia Physical Anesthesia Plan  ASA: 3  Anesthesia Plan: Regional   Post-op Pain Management:    Induction:   PONV Risk Score and Plan: Treatment may vary due to age or medical condition  Airway Management Planned: Natural Airway  Additional Equipment: None  Intra-op Plan:   Post-operative Plan:   Informed Consent: I have reviewed the patients History and Physical, chart, labs and discussed the procedure including the risks, benefits and alternatives for the proposed anesthesia with the patient or authorized representative who has indicated his/her understanding and acceptance.       Plan Discussed with: CRNA  Anesthesia Plan Comments:          Anesthesia Quick Evaluation

## 2024-09-06 NOTE — Progress Notes (Signed)
   09/06/24 0600  What Happened  Was fall witnessed? No  Who witnessed fall? Roselie Penner, NS (Patient was walking behind [facility], so she did not see him go down.  We heard the fall)  Patients activity before fall ambulating-unassisted (walking with Nurse secretary and daughter)  Devora of contact hip/leg  Was patient injured? Unsure  Provider Notification  Provider Name/Title Dr. Donaciano Sprang  Date Provider Notified 09/06/24  Time Provider Notified 0602  Method of Notification Call  Notification Reason Fall  Provider response See new orders  Date of Provider Response 09/06/24  Time of Provider Response 0603  Follow Up  Family notified Yes - comment  Time family notified 0603  Additional tests Yes-comment (xray)  Pain Assessment  Pain Scale 0-10  Pain Score 3  Pain Type Acute pain  Pain Location Hip  Pain Orientation Left  Pain Descriptors / Indicators Aching  Pain Onset On-going  Neurological  Neuro (WDL) WDL  Pain Assessment  Date Pain First Started 09/06/24  Result of Injury Yes

## 2024-09-06 NOTE — H&P (Addendum)
 History and Physical    Patient: [name] FMW:[id number] DOB: [birth year] DOA: 09/06/2024 DOS: the patient was seen and examined on 09/06/2024 PCP: [name], MD  Patient coming from: Home  Chief Complaint: No chief complaint on file.  HPI: NILSON Bryant is a 89 y.o. male with medical history significant of CAD s/p PCI to LAD in 2009, PAF, HFpEF, HTN, HLD, osteoporosis, and prostate cancer (s/p cryotherapy in 2009) p/w GLF c/b L hip fracture.  The patient presented with hip pain following a fall this morning. The patient reported for kyphoplasty this morning, and tripped and fell on his left hip and fractured it. As such, kyphoplasty was cancelled, and OR repair of L hip fracture planned per Ortho. Pt denies any history of falls, and notes that his last dose of Xarelto  was this past Monday.   In the [facility] pre-op area, pt was hypertensive. Labs notable for Cr 1.35. XR pelvis showed acute L hip fx. Ortho canceled kyphoplasty and requested medicine admission for repair of acute L hip fx.   Review of Systems: As mentioned in the history of present illness. All other systems reviewed and are negative. Past Medical History:  Diagnosis Date   Atrial fibrillation [facility])    Coronary artery disease    Nov 2009-MI-stenting of the mid LAD drug-eluding stent   Hx of radiation therapy 05/15/14- 07/11/14   prostate 7800 cGy 40 sessions, seminal vesicles 5600 cGy 40 sessions   Hyperlipidemia    Hypertension    LV dysfunction    MI (myocardial infarction) (HCC) 1998   Osteopenia    Prostate CA (HCC) 05/05/2009   prostate cancer 11/09-tx with cryotherapy, Dr Chales   Past Surgical History:  Procedure Laterality Date   CARDIAC CATHETERIZATION     Nov 2009-drug eluding stent to mid LAD   HERNIA REPAIR     inguinal   IR KYPHO EA ADDL LEVEL THORACIC OR LUMBAR  04/28/2023   IR KYPHO LUMBAR INC FX REDUCE BONE BX UNI/BIL CANNULATION INC/IMAGING  04/28/2023   PROSTATE BIOPSY  09/25/2008   gleason  4+3=7   PROSTATE CRYOABLATION  05/05/2009   Dr Chales   RIGHT/LEFT HEART CATH AND CORONARY ANGIOGRAPHY N/A 11/02/2023   Procedure: RIGHT/LEFT HEART CATH AND CORONARY ANGIOGRAPHY;  Surgeon: Swaziland, Peter M, MD;  Location: [facility] INVASIVE CV LAB;  Service: Cardiovascular;  Laterality: N/A;   Social History:  reports that he has never smoked. He has never used smokeless tobacco. He reports that he does not drink alcohol and does not use drugs.  No Known Allergies  Family History  Problem Relation Age of Onset   Heart disease Mother    Heart failure Mother    Heart disease Brother    Hematuria Father    Cancer Father        prostate   Heart disease Sister     Prior to Admission medications   Medication Sig Start Date End Date Taking? Authorizing Provider  calcitonin, salmon, (MIACALCIN/FORTICAL) 200 UNIT/ACT nasal spray Place 1 spray into alternate nostrils daily in the afternoon. 07/24/24  Yes [provider]  Cholecalciferol  (VITAMIN D3) 2000 UNITS TABS Take 2,000 Int'l Units by mouth every evening.   Yes [provider]  dapagliflozin  propanediol (FARXIGA ) 10 MG TABS tablet Take 1 tablet (10 mg total) by mouth daily before breakfast. 05/25/24  Yes [place], Peter M, MD  methocarbamol  (ROBAXIN ) 500 MG tablet Take 1-2 tablets (500-1,000 mg total) by mouth every 6 (six) hours as needed. 05/22/24  Yes [place], Peter M, MD  rivaroxaban  (XARELTO ) 20 MG TABS tablet TAKE 1 TABLET BY MOUTH DAILY WITH SUPPER 08/20/24  Yes [place], Peter M, MD  sacubitril -valsartan  (ENTRESTO ) 97-103 MG Take 1 tablet by mouth 2 (two) times daily. 04/04/24  Yes [place], Peter M, MD  simvastatin  (ZOCOR ) 40 MG tablet TAKE 1 TABLET BY MOUTH AT BEDTIME 02/17/24  Yes [place], Peter M, MD  spironolactone  (ALDACTONE ) 25 MG tablet Take 0.5 tablets (12.5 mg total) by mouth daily. 04/04/24 08/31/27 Yes [place], Peter M, MD    Physical Exam: Vitals:   09/06/24 0609 09/06/24 0711  BP: (!) (P) 155/110 (!) 163/98  Pulse: (P)  82   Resp: (P) 20   Temp: (P) 97.7 F (36.5 C)   TempSrc: (P) Oral   SpO2: (P) 100%   Weight: (P) 67.1 kg   Height: (P) 5' 7 (1.702 m)    General: Alert, oriented x3, resting comfortably in no acute distress Respiratory: Lungs clear to auscultation bilaterally with normal respiratory effort; no w/r/r Cardiovascular: Regular rate and rhythm w/o m/r/g Abdomen: Soft, nontender, nondistended. Positive bowel sounds MSK: LLE extended and externally rotated   Data Reviewed:  Lab Results  Component Value Date   WBC 7.8 09/03/2024   HGB 15.3 09/03/2024   HCT 47.1 09/03/2024   MCV 102.6 (H) 09/03/2024   PLT 151 09/03/2024   Lab Results  Component Value Date   GLUCOSE 114 (H) 09/03/2024   CALCIUM 9.8 09/03/2024   NA 144 09/03/2024   K 4.6 09/03/2024   CO2 26 09/03/2024   CL 107 09/03/2024   BUN 17 09/03/2024   CREATININE 1.35 (H) 09/03/2024   Lab Results  Component Value Date   ALT 10 05/22/2024   AST 25 05/22/2024   ALKPHOS 161 (H) 05/22/2024   BILITOT 0.8 05/22/2024   Lab Results  Component Value Date   INR 1.1 10/26/2023   INR 1.1 04/28/2023   INR 1.1 11/01/2008   Radiology: X-ray pelvis complete Result Date: 09/06/2024 EXAM: 1 or 2 VIEW(S) XRAY OF THE PELVIS 09/06/2024 06:36:00 AM COMPARISON: None available. CLINICAL HISTORY: Fall 190176. Reason for exam: fall; Per progress notes: Patient upon entering department had a fall and landed on L hip. Daughter and [place], [place] walking with patient. Dr. Donaciano Sprang notified @ 0600 and order for pelvic xray received. FINDINGS: BONES AND JOINTS: Acute fracture of left femoral neck with proximal migration of distal femur. SOFT TISSUES: Vascular calcifications. IMPRESSION: 1. Acute fracture of left femoral neck with proximal migration of distal femur. Electronically signed by: Waddell Calk MD 09/06/2024 07:01 AM EDT RP Workstation: [id number]    Assessment and Plan: 65M h/o CAD s/p PCI to LAD in 2009, PAF, HFpEF, HTN, HLD,  osteoporosis, and prostate cancer (s/p cryotherapy in 2009) p/w GLF c/b L hip fracture.  Acute L hip fracture -Ortho consulted; apprec eval/recs -Tylenol  1g TID, robaxin  500mg  TID, gabapentin  300mg  TID, and oxycodone  5mg  q4h prn pain -NPO w/ sips for meds for anticipated procedure  PAF HOLD Xarelto  in preparation for surgery; resume once safe per Ortho HOLD GDMT (Entresto  and spironolactone ) for now; resume post-op  PREOPPLAN Pre-op Cardiac risk stratification: Patient has CAD s/p PCI to LAD, PAF, and HFpEF. Patient is able to perform 4 METs of activity without anginal symptoms including dyspnea, chest pain, palpitations. Patient meets 0/6 RCRI risk factors and is therefore a risk class II (5% for cardiovascular complication).  Would not delay surgery for further cardiac testing.  Patient has no indication  for beta-blockade for the purpose of perioperative risk reduction.   Pulmonary Risk assessment: Patient has no reported historical or active pulmonary conditions including COPD, asthma, OSA. ARISCAT score 16 (for age only) indicating low risk (1.6%) of complications provided surgery lasts <2 hours. Would recommend standard pulmonary toilet with incentive spirometer and a capella valve.  Minimize the use of sedating meds such as opioids and benzodiazepines as much as possible.   Advance Care Planning:   Code Status: Full Code   Consults: Ortho  Family Communication: Daughter   Severity of Illness: The appropriate patient status for this patient is INPATIENT. Inpatient status is judged to be reasonable and necessary in order to provide the required intensity of service to ensure the patient's safety. The patient's presenting symptoms, physical exam findings, and initial radiographic and laboratory data in the context of their chronic comorbidities is felt to place them at high risk for further clinical deterioration. Furthermore, it is not anticipated that the patient will be medically stable  for discharge from the hospital within 2 midnights of admission.   * I certify that at the point of admission it is my clinical judgment that the patient will require inpatient hospital care spanning beyond 2 midnights from the point of admission due to high intensity of service, high risk for further deterioration and high frequency of surveillance required.*   ------- I spent 55 minutes reviewing previous notes, at the bedside counseling/discussing the treatment plan, and performing clinical documentation.  Author: Marsha Ada, MD 09/06/2024 8:02 AM  For on call review [email or web address].

## 2024-09-06 NOTE — Progress Notes (Signed)
 [name] is a very pleasant 89 year old gentleman who is scheduled to have a lumbar kyphoplasty.  Unfortunately while he was walking towards preop holding he tripped and fell and had increasing left hip pain.  Imaging was performed which demonstrated a new acute left femoral neck fracture.  As a result of the new pathology we have canceled the kyphoplasty and will arrange admission to the hospitalist team for medical clearance for surgical management of his hip fracture.  I have discussed this with the patient and his family and all of their questions were addressed.

## 2024-09-06 NOTE — Progress Notes (Deleted)
   09/06/24 0600  What Happened  Was fall witnessed? Yes  Who witnessed fall? Roselie Penner, NS  Patients activity before fall ambulating-assisted  Point of contact hip/leg  Was patient injured? Unsure  Provider Notification  Provider Name/Title Dr. Donaciano Sprang  Date Provider Notified 09/06/24  Time Provider Notified 0602  Method of Notification Call  Notification Reason Fall  Provider response See new orders  Date of Provider Response 09/06/24  Time of Provider Response 0603  Follow Up  Family notified Yes - comment  Time family notified 0603  Additional tests Yes-comment (xray)  Pain Assessment  Pain Scale 0-10  Pain Score 3  Pain Type Acute pain  Pain Location Hip  Pain Orientation Left  Pain Descriptors / Indicators Aching  Pain Onset On-going  Neurological  Neuro (WDL) WDL  Pain Assessment  Date Pain First Started 09/06/24  Result of Injury Yes

## 2024-09-06 NOTE — H&P (View-Only) (Signed)
 Reason for Consult:Left hip fx Referring Physician: Donaciano Sprang Time called: 0745 Time at bedside: 0903   [name] is an 89 y.o. male.  HPI: Karon was coming in this morning for kyphoplasty by Dr. Sprang. While in pre-op holding walking to his bed he tripped and fell. He had immediate left hip pain and could not get up. X-rays showed a left hip fx. Kyphoplasty was cancelled.  Past Medical History:  Diagnosis Date   Atrial fibrillation [facility])    Coronary artery disease    Nov 2009-MI-stenting of the mid LAD drug-eluding stent   Hx of radiation therapy 05/15/14- 07/11/14   prostate 7800 cGy 40 sessions, seminal vesicles 5600 cGy 40 sessions   Hyperlipidemia    Hypertension    LV dysfunction    MI (myocardial infarction) (HCC) 1998   Osteopenia    Prostate CA (HCC) 05/05/2009   prostate cancer 11/09-tx with cryotherapy, Dr Chales    Past Surgical History:  Procedure Laterality Date   CARDIAC CATHETERIZATION     Nov 2009-drug eluding stent to mid LAD   HERNIA REPAIR     inguinal   IR KYPHO EA ADDL LEVEL THORACIC OR LUMBAR  04/28/2023   IR KYPHO LUMBAR INC FX REDUCE BONE BX UNI/BIL CANNULATION INC/IMAGING  04/28/2023   PROSTATE BIOPSY  09/25/2008   gleason 4+3=7   PROSTATE CRYOABLATION  05/05/2009   Dr Chales   RIGHT/LEFT HEART CATH AND CORONARY ANGIOGRAPHY N/A 11/02/2023   Procedure: RIGHT/LEFT HEART CATH AND CORONARY ANGIOGRAPHY;  Surgeon: Swaziland, Peter M, MD;  Location: [facility] INVASIVE CV LAB;  Service: Cardiovascular;  Laterality: N/A;    Family History  Problem Relation Age of Onset   Heart disease Mother    Heart failure Mother    Heart disease Brother    Hematuria Father    Cancer Father        prostate   Heart disease Sister     Social History:  reports that he has never smoked. He has never used smokeless tobacco. He reports that he does not drink alcohol and does not use drugs.  Allergies: No Known Allergies  Medications: I have reviewed the patient's  current medications.  No results found for this or any previous visit (from the past 48 hours).  X-ray pelvis complete Result Date: 09/06/2024 EXAM: 1 or 2 VIEW(S) XRAY OF THE PELVIS 09/06/2024 06:36:00 AM COMPARISON: None available. CLINICAL HISTORY: Fall 190176. Reason for exam: fall; Per progress notes: Patient upon entering department had a fall and landed on L hip. Daughter and [place], [place] walking with patient. Dr. Donaciano Sprang notified @ 0600 and order for pelvic xray received. FINDINGS: BONES AND JOINTS: Acute fracture of left femoral neck with proximal migration of distal femur. SOFT TISSUES: Vascular calcifications. IMPRESSION: 1. Acute fracture of left femoral neck with proximal migration of distal femur. Electronically signed by: Waddell Calk MD 09/06/2024 07:01 AM EDT RP Workstation: [id number]    Review of Systems  HENT:  Negative for ear discharge, ear pain, hearing loss and tinnitus.   Eyes:  Negative for photophobia and pain.  Respiratory:  Negative for cough and shortness of breath.   Cardiovascular:  Negative for chest pain.  Gastrointestinal:  Negative for abdominal pain, nausea and vomiting.  Genitourinary:  Negative for dysuria, flank pain, frequency and urgency.  Musculoskeletal:  Positive for arthralgias (Left hip) and back pain. Negative for myalgias and neck pain.  Neurological:  Negative for dizziness and headaches.  Hematological:  Does not bruise/bleed  easily.  Psychiatric/Behavioral:  The patient is not nervous/anxious.    Blood pressure (!) 163/98, pulse 82, temperature 97.7 F (36.5 C), temperature source Oral, resp. rate 20, height 5' 7 (1.702 m), weight 67.1 kg, SpO2 100%. Physical Exam Constitutional:      General: He is not in acute distress.    Appearance: He is well-developed. He is not diaphoretic.  HENT:     Head: Normocephalic and atraumatic.  Eyes:     General: No scleral icterus.       Right eye: No discharge.        Left eye: No  discharge.     Conjunctiva/sclera: Conjunctivae normal.  Cardiovascular:     Rate and Rhythm: Normal rate and regular rhythm.  Pulmonary:     Effort: Pulmonary effort is normal. No respiratory distress.  Musculoskeletal:     Cervical back: Normal range of motion.     Comments: LLE No traumatic wounds, ecchymosis, or rash  Mod TTP hip  No knee or ankle effusion  Knee stable to varus/ valgus and anterior/posterior stress  Sens DPN, SPN, TN intact  Motor EHL, ext, flex, evers 5/5  DP 2+, PT 2+, No significant edema  Skin:    General: Skin is warm and dry.  Neurological:     Mental Status: He is alert.  Psychiatric:        Mood and Affect: Mood normal.        Behavior: Behavior normal.     Assessment/Plan: Left hip fx -- Plan THA vs hip hemi tomorrow with Dr. Kendal. Please keep NPO after MN.    Ozell DOROTHA Ned, PA-C Orthopedic Surgery [phone] 09/06/2024, 9:19 AM

## 2024-09-06 NOTE — Plan of Care (Signed)

## 2024-09-06 NOTE — Anesthesia Procedure Notes (Signed)
 Anesthesia Regional Block: Femoral nerve block   Pre-Anesthetic Checklist: , timeout performed,  Correct Patient, Correct Site, Correct Laterality,  Correct Procedure, Correct Position, site marked,  Risks and benefits discussed,  Pre-op evaluation,  At surgeon's request and post-op pain management  Laterality: Left  Prep: Maximum Sterile Barrier Precautions used, chloraprep       Needles:  Injection technique: Single-shot  Needle Type: Echogenic Stimulator Needle     Needle Length: 9cm  Needle Gauge: 22     Additional Needles:   Procedures:,,,, ultrasound used (permanent image in chart),,    Narrative:  Start time: 09/06/2024 4:44 PM End time: 09/06/2024 4:47 PM Injection made incrementally with aspirations every 5 mL.  Performed by: Personally  Anesthesiologist: Paul Lamarr BRAVO, MD  Additional Notes: Risks, benefits, and alternative discussed. Patient gave consent for procedure. Patient prepped and draped in sterile fashion. Sedation administered, patient remains easily responsive to voice. Relevant anatomy identified with ultrasound guidance. Local anesthetic given in 5cc increments with no signs or symptoms of intravascular injection. No pain or paraesthesias with injection. Patient monitored throughout procedure with signs of LAST or immediate complications. Tolerated well. Ultrasound image placed in chart.  LANEY Paul, MD

## 2024-09-06 NOTE — Progress Notes (Addendum)
 0600 late entry  Patient upon entering department had a fall and landed on L hip.  Daughter and [place], [place] walking with patient.  Dr. Donaciano Sprang notified @ 0600 and order for pelvic xray received. Getting vitals on patient at this time. Will continue to monitor.   9354 Around 10 Dr. Mallory arrived and I notified him of the patient's fall when entering our department.  I also mentioned to him the patient's elevated B/P 155/110 and then the recheck of 163/98.    0813  Dr. Sprang arrived to department reviewed images and has cancelled the patient's surgery.  He has called for our hospitalist and to consult and admit patient. He also called for his associate to assess and consult for the hip fracture.

## 2024-09-07 ENCOUNTER — Encounter (HOSPITAL_COMMUNITY): Payer: Self-pay | Admitting: Hospitalist

## 2024-09-07 ENCOUNTER — Inpatient Hospital Stay (HOSPITAL_COMMUNITY): Payer: Self-pay

## 2024-09-07 ENCOUNTER — Encounter (HOSPITAL_COMMUNITY)
Admission: RE | Disposition: A | Discharge status: Home Health | Payer: Self-pay | Source: Home / Self Care | Attending: Internal Medicine

## 2024-09-07 ENCOUNTER — Other Ambulatory Visit: Payer: Self-pay

## 2024-09-07 ENCOUNTER — Inpatient Hospital Stay (HOSPITAL_COMMUNITY)

## 2024-09-07 DIAGNOSIS — M25552 Pain in left hip: Secondary | ICD-10-CM

## 2024-09-07 DIAGNOSIS — I482 Chronic atrial fibrillation, unspecified: Secondary | ICD-10-CM | POA: Diagnosis not present

## 2024-09-07 DIAGNOSIS — I10 Essential (primary) hypertension: Secondary | ICD-10-CM | POA: Diagnosis not present

## 2024-09-07 DIAGNOSIS — S72002D Fracture of unspecified part of neck of left femur, subsequent encounter for closed fracture with routine healing: Secondary | ICD-10-CM | POA: Diagnosis not present

## 2024-09-07 DIAGNOSIS — I2510 Atherosclerotic heart disease of native coronary artery without angina pectoris: Secondary | ICD-10-CM | POA: Diagnosis not present

## 2024-09-07 DIAGNOSIS — Z01818 Encounter for other preprocedural examination: Secondary | ICD-10-CM

## 2024-09-07 HISTORY — PX: TOTAL HIP ARTHROPLASTY: SHX124

## 2024-09-07 LAB — GLUCOSE, CAPILLARY: Glucose-Capillary: 234 mg/dL — ABNORMAL HIGH (ref 70–99)

## 2024-09-07 SURGERY — ARTHROPLASTY, HIP, TOTAL, ANTERIOR APPROACH
Anesthesia: Spinal | Site: Hip

## 2024-09-07 MED ORDER — CEFAZOLIN SODIUM-DEXTROSE 2-4 GM/100ML-% IV SOLN
2.0000 g | INTRAVENOUS | Status: AC
  Administered 2024-09-07: 2 g via INTRAVENOUS

## 2024-09-07 MED ORDER — ONDANSETRON HCL 4 MG/2ML IJ SOLN
INTRAMUSCULAR | Status: DC | PRN
  Administered 2024-09-07: 4 mg via INTRAVENOUS

## 2024-09-07 MED ORDER — FENTANYL CITRATE (PF) 100 MCG/2ML IJ SOLN: 25.0000 ug | INTRAMUSCULAR | Status: DC | PRN

## 2024-09-07 MED ORDER — TRANEXAMIC ACID-NACL 1000-0.7 MG/100ML-% IV SOLN
INTRAVENOUS | Status: AC
  Filled 2024-09-07: qty 100

## 2024-09-07 MED ORDER — PHENYLEPHRINE HCL-NACL 20-0.9 MG/250ML-% IV SOLN
INTRAVENOUS | Status: DC | PRN
  Administered 2024-09-07: 60 ug/min via INTRAVENOUS

## 2024-09-07 MED ORDER — MIDAZOLAM HCL 2 MG/2ML IJ SOLN
INTRAMUSCULAR | Status: AC
  Filled 2024-09-07: qty 2

## 2024-09-07 MED ORDER — VANCOMYCIN HCL 1000 MG IV SOLR
INTRAVENOUS | Status: DC | PRN
  Administered 2024-09-07: 1000 mg via TOPICAL

## 2024-09-07 MED ORDER — PROPOFOL 10 MG/ML IV BOLUS
INTRAVENOUS | Status: DC | PRN
Start: 2024-09-07 — End: 2024-09-07
  Administered 2024-09-07: 50 mg via INTRAVENOUS
  Administered 2024-09-07: 30 mg via INTRAVENOUS

## 2024-09-07 MED ORDER — BUPIVACAINE IN DEXTROSE 0.75-8.25 % IT SOLN
INTRATHECAL | Status: DC | PRN
Start: 2024-09-07 — End: 2024-09-07
  Administered 2024-09-07: 1.8 mL via INTRATHECAL

## 2024-09-07 MED ORDER — 0.9 % SODIUM CHLORIDE (POUR BTL) OPTIME
TOPICAL | Status: DC | PRN
  Administered 2024-09-07: 1000 mL

## 2024-09-07 MED ORDER — EPHEDRINE SULFATE-NACL 50-0.9 MG/10ML-% IV SOSY
PREFILLED_SYRINGE | INTRAVENOUS | Status: DC | PRN
  Administered 2024-09-07: 10 mg via INTRAVENOUS

## 2024-09-07 MED ORDER — ALBUMIN HUMAN 5 % IV SOLN
12.5000 g | Freq: Once | INTRAVENOUS | Status: AC
  Administered 2024-09-07: 12.5 g via INTRAVENOUS

## 2024-09-07 MED ORDER — ESMOLOL HCL 100 MG/10ML IV SOLN
INTRAVENOUS | Status: DC | PRN
  Administered 2024-09-07: 30 mg via INTRAVENOUS

## 2024-09-07 MED ORDER — DEXAMETHASONE SODIUM PHOSPHATE 10 MG/ML IJ SOLN
INTRAMUSCULAR | Status: DC | PRN
  Administered 2024-09-07: 5 mg via INTRAVENOUS

## 2024-09-07 MED ORDER — PHENYLEPHRINE 80 MCG/ML (10ML) SYRINGE FOR IV PUSH (FOR BLOOD PRESSURE SUPPORT)
PREFILLED_SYRINGE | INTRAVENOUS | Status: DC | PRN
  Administered 2024-09-07 (×2): 160 ug via INTRAVENOUS
  Administered 2024-09-07: 80 ug via INTRAVENOUS
  Administered 2024-09-07: 160 ug via INTRAVENOUS
  Administered 2024-09-07 (×2): 80 ug via INTRAVENOUS

## 2024-09-07 MED ORDER — CEFAZOLIN SODIUM-DEXTROSE 2-4 GM/100ML-% IV SOLN
2.0000 g | Freq: Three times a day (TID) | INTRAVENOUS | Status: AC
  Administered 2024-09-07 – 2024-09-08 (×3): 2 g via INTRAVENOUS
  Filled 2024-09-07 (×3): qty 100

## 2024-09-07 MED ORDER — STERILE WATER FOR IRRIGATION IR SOLN
Status: DC | PRN
  Administered 2024-09-07: 3000 mL

## 2024-09-07 MED ORDER — CHLORHEXIDINE GLUCONATE 0.12 % MT SOLN: 15.0000 mL | Freq: Once | OROMUCOSAL | Status: DC

## 2024-09-07 MED ORDER — ALBUMIN HUMAN 5 % IV SOLN
INTRAVENOUS | Status: AC
  Filled 2024-09-07: qty 250

## 2024-09-07 MED ORDER — ORAL CARE MOUTH RINSE: 15.0000 mL | Freq: Once | OROMUCOSAL | Status: DC

## 2024-09-07 MED ORDER — CHLORHEXIDINE GLUCONATE 4 % EX SOLN
60.0000 mL | Freq: Once | CUTANEOUS | Status: DC
Start: 2024-09-07 — End: 2024-09-07

## 2024-09-07 MED ORDER — VANCOMYCIN HCL 1000 MG IV SOLR
INTRAVENOUS | Status: AC
  Filled 2024-09-07: qty 20

## 2024-09-07 MED ORDER — CEFAZOLIN SODIUM-DEXTROSE 2-4 GM/100ML-% IV SOLN
INTRAVENOUS | Status: AC
  Filled 2024-09-07: qty 100

## 2024-09-07 MED ORDER — TRANEXAMIC ACID-NACL 1000-0.7 MG/100ML-% IV SOLN
1000.0000 mg | INTRAVENOUS | Status: AC
  Administered 2024-09-07: 1000 mg via INTRAVENOUS

## 2024-09-07 MED ORDER — POVIDONE-IODINE 10 % EX SWAB: 2.0000 | Freq: Once | CUTANEOUS | Status: DC

## 2024-09-07 MED ORDER — PROPOFOL 500 MG/50ML IV EMUL
INTRAVENOUS | Status: DC | PRN
  Administered 2024-09-07: 100 ug/kg/min via INTRAVENOUS

## 2024-09-07 MED ORDER — CHLORHEXIDINE GLUCONATE 0.12 % MT SOLN
OROMUCOSAL | Status: AC
  Filled 2024-09-07: qty 15

## 2024-09-07 MED ORDER — LACTATED RINGERS IV SOLN: INTRAVENOUS | Status: DC

## 2024-09-07 SURGICAL SUPPLY — 42 items
BAG COUNTER SPONGE SURGICOUNT (BAG) ×1 IMPLANT
BIT DRILL 7/64X5 DISP (BIT) ×1 IMPLANT
BLADE SAW SGTL 18X1.27X75 (BLADE) ×1 IMPLANT
BNDG COHESIVE 6X5 TAN ST LF (GAUZE/BANDAGES/DRESSINGS) ×1 IMPLANT
CHLORAPREP W/TINT 26 (MISCELLANEOUS) ×1 IMPLANT
COVER PERINEAL POST (MISCELLANEOUS) ×1 IMPLANT
COVER SURGICAL LIGHT HANDLE (MISCELLANEOUS) ×1 IMPLANT
DERMABOND ADVANCED .7 DNX12 (GAUZE/BANDAGES/DRESSINGS) ×1 IMPLANT
DRAPE C-ARM 42X72 X-RAY (DRAPES) ×1 IMPLANT
DRAPE IMP U-DRAPE 54X76 (DRAPES) ×1 IMPLANT
DRAPE STERI IOBAN 125X83 (DRAPES) ×1 IMPLANT
DRAPE U-SHAPE 47X51 STRL (DRAPES) IMPLANT
DRESSING AQUACEL AG SP 3.5X6 (GAUZE/BANDAGES/DRESSINGS) ×1 IMPLANT
DRSG AQUACEL AG ADV 3.5X10 (GAUZE/BANDAGES/DRESSINGS) IMPLANT
ELECT BLADE 6.5 EXT (BLADE) IMPLANT
ELECT CAUTERY BLADE 6.4 (BLADE) IMPLANT
ELECTRODE REM PT RTRN 9FT ADLT (ELECTROSURGICAL) ×1 IMPLANT
G7 VIT E NTRL LNR 36 SZG (Miscellaneous) IMPLANT
GLOVE BIO SURGEON STRL SZ 6.5 (GLOVE) ×3 IMPLANT
GLOVE BIO SURGEON STRL SZ7.5 (GLOVE) ×3 IMPLANT
GLOVE BIOGEL PI IND STRL 6.5 (GLOVE) ×1 IMPLANT
GLOVE BIOGEL PI IND STRL 7.5 (GLOVE) ×1 IMPLANT
GOWN STRL REUS W/ TWL LRG LVL3 (GOWN DISPOSABLE) ×2 IMPLANT
HEAD FEM MOD 36M STD (Miscellaneous) IMPLANT
HOOD PEEL AWAY T7 (MISCELLANEOUS) ×3 IMPLANT
KIT BASIN OR (CUSTOM PROCEDURE TRAY) ×1 IMPLANT
KIT TURNOVER KIT B (KITS) ×1 IMPLANT
MANIFOLD NEPTUNE II (INSTRUMENTS) ×1 IMPLANT
NS IRRIG 1000ML POUR BTL (IV SOLUTION) ×1 IMPLANT
PACK TOTAL JOINT (CUSTOM PROCEDURE TRAY) ×1 IMPLANT
PACK UNIVERSAL I (CUSTOM PROCEDURE TRAY) ×1 IMPLANT
PAD ARMBOARD POSITIONER FOAM (MISCELLANEOUS) ×2 IMPLANT
SET INTERPULSE LAVAGE W/TIP (ORTHOPEDIC DISPOSABLE SUPPLIES) ×1 IMPLANT
SHELL ACET G7 4H 60 SZG (Shell) IMPLANT
STAPLER SKIN PROX 35W (STAPLE) IMPLANT
STEM FEM CMTLS 13 146 (Stem) IMPLANT
SUT ETHIBOND NAB CT1 #1 30IN (SUTURE) ×1 IMPLANT
SUT MNCRL AB 3-0 PS2 18 (SUTURE) IMPLANT
SUT MON AB 2-0 SH 27 (SUTURE) ×1 IMPLANT
TOWEL GREEN STERILE (TOWEL DISPOSABLE) ×1 IMPLANT
TUBE SUCT ARGYLE STRL (TUBING) ×1 IMPLANT
WATER STERILE IRR 1000ML POUR (IV SOLUTION) ×1 IMPLANT

## 2024-09-07 NOTE — Anesthesia Procedure Notes (Signed)
 Arterial Line Insertion Start/End9/11/2024 1:50 PM, 09/07/2024 2:00 PM Performed by: Epifanio Fallow, MD, Lockie Flesher, CRNA  Patient location: Pre-op. Preanesthetic checklist: patient identified, IV checked, site marked, risks and benefits discussed, surgical consent, monitors and equipment checked, pre-op evaluation, timeout performed and anesthesia consent Patient sedated Left, radial was placed Catheter size: 20 G Hand hygiene performed  and maximum sterile barriers used   Attempts: 2 Procedure performed using ultrasound guided technique. Ultrasound Notes:anatomy identified, needle tip was noted to be adjacent to the nerve/plexus identified and no ultrasound evidence of intravascular and/or intraneural injection Following insertion, dressing applied. Post procedure assessment: normal and unchanged  Patient tolerated the procedure well with no immediate complications.

## 2024-09-07 NOTE — Anesthesia Procedure Notes (Signed)
 Spinal  Patient location during procedure: OR Start time: 09/07/2024 12:14 PM End time: 09/07/2024 12:19 PM Reason for block: surgical anesthesia Staffing Performed: anesthesiologist  Anesthesiologist: Epifanio Fallow, MD Performed by: Epifanio Fallow, MD Authorized by: Epifanio Fallow, MD   Preanesthetic Checklist Completed: patient identified, IV checked, risks and benefits discussed, surgical consent, monitors and equipment checked, pre-op evaluation and timeout performed Spinal Block Patient position: right lateral decubitus Prep: DuraPrep Patient monitoring: cardiac monitor, continuous pulse ox and blood pressure Approach: midline Location: L3-4 Injection technique: single-shot Needle Needle type: Quincke  Needle gauge: 22 G Needle length: 9 cm Assessment Sensory level: T8 Events: CSF return Additional Notes Functioning IV was confirmed and monitors were applied. Sterile prep and drape, including hand hygiene and sterile gloves were used. The patient was positioned and the spine was prepped. The skin was anesthetized with lidocaine .  Free flow of clear CSF was obtained prior to injecting local anesthetic into the CSF.  The spinal needle aspirated freely following injection.  The needle was carefully withdrawn.  The patient tolerated the procedure well.

## 2024-09-07 NOTE — Transfer of Care (Addendum)
 Immediate Anesthesia Transfer of Care Note  Patient: [name]  Procedure(s) Performed: ARTHROPLASTY, HIP, TOTAL, ANTERIOR APPROACH, LEFT (Hip)  Patient Location: PACU  Anesthesia Type:Spinal  Level of Consciousness: awake, alert , and oriented  Airway & Oxygen Therapy: Patient Spontanous Breathing and Patient connected to nasal cannula oxygen  Post-op Assessment: Report given to RN and Post -op Vital signs reviewed and stable  Post vital signs: Reviewed and stable Patient remains on Neo gtt for blood pressure support  Last Vitals:  Vitals Value Taken Time  BP 110/90 09/07/24 14:45  Temp    Pulse 97 09/07/24 14:52  Resp 14 09/07/24 14:52  SpO2 92 % 09/07/24 14:52  Vitals shown include unfiled device data.  Last Pain:  Vitals:   09/07/24 1109  TempSrc:   PainSc: 4       Patients Stated Pain Goal: 3 (09/06/24 1050)  Complications: No notable events documented.

## 2024-09-07 NOTE — Anesthesia Preprocedure Evaluation (Addendum)
 Anesthesia Evaluation  Patient identified by MRN, date of birth, ID band Patient awake    Reviewed: Allergy & Precautions, H&P , NPO status , Patient's Chart, lab work & pertinent test results  Airway Mallampati: I  TM Distance: >3 FB Neck ROM: Full    Dental no notable dental hx. (+) Dental Advisory Given, Teeth Intact   Pulmonary neg pulmonary ROS   Pulmonary exam normal breath sounds clear to auscultation       Cardiovascular hypertension, Pt. on medications + CAD, + Past MI and + Cardiac Stents  + dysrhythmias Atrial Fibrillation  Rhythm:Regular Rate:Normal     Neuro/Psych negative neurological ROS  negative psych ROS   GI/Hepatic negative GI ROS, Neg liver ROS,,,  Endo/Other  negative endocrine ROS    Renal/GU negative Renal ROS  negative genitourinary   Musculoskeletal   Abdominal   Peds  Hematology negative hematology ROS (+)   Anesthesia Other Findings   Reproductive/Obstetrics negative OB ROS                              Anesthesia Physical Anesthesia Plan  ASA: 3  Anesthesia Plan: Spinal   Post-op Pain Management: Ofirmev  IV (intra-op)*   Induction: Intravenous  PONV Risk Score and Plan: 2 and Ondansetron , Dexamethasone  and Propofol  infusion  Airway Management Planned: Natural Airway and Simple Face Mask  Additional Equipment:   Intra-op Plan:   Post-operative Plan:   Informed Consent: I have reviewed the patients History and Physical, chart, labs and discussed the procedure including the risks, benefits and alternatives for the proposed anesthesia with the patient or authorized representative who has indicated his/her understanding and acceptance.     Dental advisory given  Plan Discussed with: CRNA  Anesthesia Plan Comments:          Anesthesia Quick Evaluation

## 2024-09-07 NOTE — Interval H&P Note (Signed)
 History and Physical Interval Note:  09/07/2024 11:23 AM  [name]  has presented today for surgery, with the diagnosis of left hip pain.  The various methods of treatment have been discussed with the patient and family. After consideration of risks, benefits and other options for treatment, the patient has consented to  Procedure(s) with comments: ARTHROPLASTY, HIP, TOTAL, ANTERIOR APPROACH (N/A) - LEFT TOTAL HIP ANTERIOR VERSUS HIP HEMI as a surgical intervention.  The patient's history has been reviewed, patient examined, no change in status, stable for surgery.  I have reviewed the patient's chart and labs.  Questions were answered to the patient's satisfaction.     Conrad Zajkowski P Mardella Nuckles

## 2024-09-07 NOTE — Progress Notes (Signed)
 PROGRESS NOTE  [name] FMW:[id number] DOB: 11-Feb-1933 DOA: 09/06/2024 PCP: [name], MD   LOS: 1 day   Brief Narrative / Interim history: This is an 89 year old male with history of CAD with PCI to LAD in 2009, PAF, chronic diastolic CHF, HTN, HLD, prostate cancer with cryotherapy in 2009 comes into the hospital with fall resulting in left hip fracture.  He has been having some chronic lumbar back pain, he was scheduled to have kyphoplasty as an outpatient, and in the hospital, in the preop area he fell and fractured his hip.  His kyphoplasty was canceled and he was admitted to the hospitalist service  Subjective / 24h Interval events: He is doing well this morning, complains of hip pain but relatively well-controlled with the block.  Awaiting surgery this morning.  He does not have any chest pain, shortness of breath, abdominal pain, nausea or vomiting.  Daughter is at bedside in the room  Assesement and Plan: Principal problem Left hip fracture-orthopedic surgery consulted, he will be taken to the OR this morning for operative repair.  Monitor postoperatively for pain control, DVT prophylaxis per Ortho, daily labs and awaiting postop PT evaluation to determine disposition  Active problems PAF-hold Xarelto  preoperatively, resume once safe per Ortho surgery  CAD-status post DES to LAD 2009.  He has no chest pain, prior to his fall he was feeling at baseline.  Cardiology evaluated patient preoperatively on 9/11.  Chronic combined CHF-most recent 2D echo August 2025 showed LVEF 35-40%, global hypokinesis, mildly reduced RV, normal PA pressure, severe left atrial enlargement, moderate MR.  Currently appears euvolemic and compensated.  Apparently has not tolerated beta-blockers in the past due to fatigue, his GDMT with Entresto , spironolactone  is on hold perioperatively.  Resume postoperatively if blood pressure tolerates  Mild AKI-creatinine 1.3 on admission, baseline 1.  Returned to  baseline this morning  Back pain-was scheduled to have lumbar kyphoplasty on the day of admission.  This is now on hold per orthopedic surgery  Scheduled Meds:  acetaminophen   1,000 mg Oral TID   Chlorhexidine  Gluconate Cloth  6 each Topical Daily   cholecalciferol   2,000 Units Oral QPM   dapagliflozin  propanediol  10 mg Oral QAC breakfast   enoxaparin  (LOVENOX ) injection  40 mg Subcutaneous Q24H   gabapentin   300 mg Oral TID   methocarbamol   500 mg Oral TID   mupirocin  ointment  1 Application Nasal BID   simvastatin   40 mg Oral QHS   Continuous Infusions: PRN Meds:.oxyCODONE   Current Outpatient Medications  Medication Instructions   calcitonin, salmon, (MIACALCIN/FORTICAL) 200 UNIT/ACT nasal spray 1 spray, Daily   Cholecalciferol  (VITAMIN D3) 2000 UNITS TABS 2,000 Int'l Units, Every evening   dapagliflozin  propanediol (FARXIGA ) 10 mg, Oral, Daily before breakfast   methocarbamol  (ROBAXIN ) 500-1,000 mg, Oral, Every 6 hours PRN   rivaroxaban  (XARELTO ) 20 MG TABS tablet TAKE 1 TABLET BY MOUTH DAILY WITH SUPPER   sacubitril -valsartan  (ENTRESTO ) 97-103 MG 1 tablet, Oral, 2 times daily   simvastatin  (ZOCOR ) 40 mg, Oral, Daily at bedtime   spironolactone  (ALDACTONE ) 12.5 mg, Oral, Daily    Diet Orders (From admission, onward)     Start     Ordered   09/07/24 0527  Diet NPO time specified  Diet effective now        09/07/24 0527           DVT prophylaxis: enoxaparin  (LOVENOX ) injection 40 mg Start: 09/06/24 0800  Lab Results  Component Value Date   PLT  142 (L) 09/06/2024      Code Status: Full Code  Family Communication: no family at bedside   Status is: Inpatient Remains inpatient appropriate because: Surgery today  Level of care: Med-Surg  Consultants:  Orthopedic surgery  Cardiology   Objective: Vitals:   09/06/24 2159 09/07/24 0035 09/07/24 0604 09/07/24 0842  BP: 112/87 111/74 134/89 125/89  Pulse: 93 88 88 92  Resp: 18 18 18 16   Temp: 98 F (36.7 C)  98.7 F (37.1 C) 97.9 F (36.6 C) 98.3 F (36.8 C)  TempSrc: Oral Oral Oral Oral  SpO2: 99% 98% 98% 100%  Weight:      Height:       No intake or output data in the 24 hours ending 09/07/24 0909 Wt Readings from Last 3 Encounters:  09/06/24 67.1 kg  09/03/24 66.8 kg  08/20/24 67.1 kg    Examination:  Constitutional: NAD Eyes: no scleral icterus ENMT: Mucous membranes are moist.  Neck: normal, supple Respiratory: clear to auscultation bilaterally, no wheezing, no crackles.  Cardiovascular: Regular rate and rhythm, no murmurs / rubs / gallops. No LE edema.  Abdomen: non distended, no tenderness. Bowel sounds positive.  Musculoskeletal: no clubbing / cyanosis.    Data Reviewed: I have independently reviewed following labs and imaging studies   CBC Recent Labs  Lab 09/03/24 1444 09/06/24 1221  WBC 7.8 11.3*  HGB 15.3 14.7  HCT 47.1 45.2  PLT 151 142*  MCV 102.6* 100.7*  MCH 33.3 32.7  MCHC 32.5 32.5  RDW 13.6 13.5    Recent Labs  Lab 09/03/24 1444 09/06/24 1221  NA 144  --   K 4.6  --   CL 107  --   CO2 26  --   GLUCOSE 114*  --   BUN 17  --   CREATININE 1.35* 1.01  CALCIUM 9.8  --     ------------------------------------------------------------------------------------------------------------------ No results for input(s): CHOL, HDL, LDLCALC, TRIG, CHOLHDL, LDLDIRECT in the last 72 hours.  No results found for: HGBA1C ------------------------------------------------------------------------------------------------------------------ No results for input(s): TSH, T4TOTAL, T3FREE, THYROIDAB in the last 72 hours.  Invalid input(s): FREET3  Cardiac Enzymes No results for input(s): CKMB, TROPONINI, MYOGLOBIN in the last 168 hours.  Invalid input(s): CK ------------------------------------------------------------------------------------------------------------------    Component Value Date/Time   BNP 449.4 (H) 09/05/2023  0529    CBG: No results for input(s): GLUCAP in the last 168 hours.  Recent Results (from the past 240 hours)  Surgical pcr screen     Status: Abnormal   Collection Time: 09/03/24  3:24 PM   Specimen: Nasal Mucosa; Nasal Swab  Result Value Ref Range Status   MRSA, PCR NEGATIVE NEGATIVE Final   Staphylococcus aureus POSITIVE (A) NEGATIVE Final    Comment: (NOTE) The Xpert SA Assay (FDA approved for NASAL specimens in patients 22 years of age and older), is one component of a comprehensive surveillance program. It is not intended to diagnose infection nor to guide or monitor treatment. Performed at [facility], [address], [place], [place] [address]      Radiology Studies: No results found.   Nilda Fendt, MD, PhD [facility]  Between 7 am - 7 pm I am available, please contact me via Amion (for emergencies) or Securechat (non urgent messages)  Between 7 pm - 7 am I am not available, please contact night coverage MD/APP via Amion

## 2024-09-07 NOTE — Progress Notes (Signed)
 2100 During assessment pt complained that he has some tingling sensation on his fingers at his rt hand, denies pain and numbness. Said that it might be related from his surgery today. 2125 Notified by family member that pt had difficulty lifting his hand/unable to bend rt arm at his elbow. Sensation for both upper extremities are the same. RRNurse called to further assess pt. On call PA for Dr. Kendal notified. Per Mehgan, PA, it might be from positioning  during surgery and hopefully it is temporary, and encourage the pt to keep moving hand/arm to try wake up the nerves. Message was relayed to pt and family and instructed to update Rn for any changes.

## 2024-09-07 NOTE — Significant Event (Signed)
 Rapid Response Event Note   Reason for Call :  R finger numbness and intermittent inability to move R arm.  Pt is s/p L total hip arthroplasty today.  Initial Focused Assessment:  Pt lying in bed with eyes open, in no visible distress. Pt is alert and oriented. He says after his surgery today he noticed his R fingers were tingling. This progressed to him being intermittently unable to bend his arm at his elbow. He says it feels like his elbow is locking up. On assessment, pt able to hold arms up equally with no drift. He says his sensation in both arms/hand/fingers are normal but says he has some tingling in his R fingers. NIH-2 for LLE weakness(pt s/p L hip replacement and was not asked to elevated L leg). His LSN is prior to surgery(Pt was transferred to perio-op at 1051 this AM). Pupils 5, equal, reactive.   T-97.8, HR-83, BP-94/60, RR-18, SpO2-94% on RA.  Interventions:  CBG-234 Plan of Care:  Pt can move his extremities equally and, though he reports tingling in his R fingers, he reports his sensation is the same in both hands. Movement difficulty is localized to R arm from elbow down and is intermittent. Await return page and any orders from ortho team. Continue to monitor pt. Please call RRT if further assistance needed.  Event Summary:   MD Notified: Ted Beers PA  Call Time:2130 Arrival [phone] End Time:2220  Tish Graeme Piety, RN

## 2024-09-07 NOTE — Op Note (Signed)
 Orthopaedic Surgery Operative Note (CSN: [id number] ) Date of Surgery: 09/07/2024  Admit Date: 09/06/2024   Diagnoses: Pre-Op Diagnoses: Left displaced femoral neck fracture  Post-Op Diagnosis: Same  Procedures: CPT 27130-Left total hip arthroplasty for left femoral neck fracture  Surgeons : Primary: Kendal Franky SQUIBB, MD  Assistant: Jon Hurst, RNFA  Location: OR 3   Anesthesia: Spinal   Antibiotics: Ancef  2g preop with 1 gm vancomycin  powder placed topically   Tourniquet time: None    Estimated Blood Loss: 350 mL  Complications:* No complications entered in OR log *   Specimens:* No specimens in log *   Implants: Implant Name Type Inv. Item Serial No. Manufacturer Lot No. LRB No. Used Action  SHELL ACET G7 4H 60 SZG - [id number] Shell SHELL ACET G7 4H 60 SZG  [employer](ORTH,TRAU,BIO,SG) [id number] Left 1 Implanted  G7 VIT E NTRL LNR 36 SZG - [id number] Miscellaneous G7 VIT E NTRL LNR 36 SZG  [employer](ORTH,TRAU,BIO,SG) [id number] Left 1 Implanted  STEM FEM CMTLS 13 146 - [id number] Stem STEM FEM CMTLS 13 146 [id number] [employer](ORTH,TRAU,BIO,SG) [id number] Left 1 Implanted  HEAD FEM MOD 32M STD - [id number] Miscellaneous HEAD FEM MOD 32M STD [id number] [employer](ORTH,TRAU,BIO,SG) [id number] Left 1 Implanted     Indications for Surgery: 89 year old male who presented for a kyphoplasty.  Unfortunately he fell in the preoperative holding area and sustained a left displaced femoral neck fracture.  Due to his age and activity level I recommended proceeding with left total hip arthroplasty.  Risks and benefits were discussed with the patient and his daughter.  Risks include but not limited to bleeding, infection, periprosthetic fracture, hip dislocation, leg length discrepancy, nerve and blood vessel injury, DVT, even the possibility anesthetic complications.  They agreed to proceed with surgery and consent was obtained.  Operative Findings: 1.  Left total hip arthroplasty  through anterior approach using [employer] G7 acetabular shell size 60 mm 2.  [employer] G7 acetabular system vitamin E highly cross-linked polyethylene liner neutral offset for 36 mm head, size G 3.  Uncemented Taperloc reduced distal size 13 high offset femoral stem 4.  [employer] cobalt chrome 36 mm standard neck femoral head  Procedure: The patient was identified in the preoperative holding area. Consent was confirmed with the patient and their family and all questions were answered. The operative extremity was marked after confirmation with the patient. he was then brought back to the operating room by our anesthesia colleagues.  He was placed under spinal anesthetic.  He was carefully transferred over to the [facility] table.  Fluoroscopic imaging showed the unstable nature of his injury.  The left hip was then prepped and draped in usual sterile fashion.  A timeout was performed to verify the patient, the procedure, and the extremity.  Preoperative antibiotics were dosed.  Standard anterior approach to the hip was made and carried down through skin and subcutaneous tissue.  I incised through the TFL fascia just lateral to the ASIS.  I then entered the interval between the sartorius and the TFL.  I went down to the hip capsule.  I exposed the hips capsule and cauterized the crossing vessels in the inferior portion of the incision.  I then exposed the anterior capsule.  I performed a capsulotomy and released it down to the lesser trochanter.  I then was able to perform a femoral neck cut and remove the femoral head and intercalary femoral neck.  I then expose the acetabulum and proceeded to remove  the labrum and peripheral soft tissues.  I measured the femoral head and it was 54 mm in size.  Under fluoroscopic imaging I then proceeded to ream the acetabulum.  I was able to ream up to a 59 mm reamer which had good peripheral fit and had good bleeding cancellous bone after I did this.  I then decided  to place a 60 mm acetabular shell.  This was press-fit it into the acetabulum and excellent fixation was obtained.  I did this under fluoroscopy to confirm adequate abduction and version.  A neutral liner for a 36 mm size head was then placed and malleted into position.  Once I had the acetabulum complete I then turned my attention to the femur.  The femur was externally rotated 225 degrees.  It was delivered into the incision.  I then released the superior capsule to deliver the femur for adequate broaching.  I then used a canal finder to enter the center of the canal.  I then sequentially broached from size 4 up into the size 13.  I got excellent rotational stability and trialed off of this.  I trialed a 36 mm, -3 neck.  I reduced the hip and the leg was short and relatively unstable.  I then dislocated the hip removed the trial implant and I felt that a size 13 high offset would be appropriate.  The final implant was placed it did sit proud to regain some of the length.  I then trialed the standard neck.  Fluoroscopic imaging showed adequate leg lengths and good hip stability intraoperatively.  I redislocated the hip and placed the final implants.  The hip was relocated and final fluoroscopic imaging was obtained.  The incision was copiously irrigated.  A gram of vancomycin  powder was placed to the incision.  The capsule was closed with #1 Ethibond.  The TFL fascia was closed with 0 Vicryl.  The skin was closed with 2-0 Monocryl and 3-0 Monocryl and Dermabond.  Sterile dressings were applied.  The patient was then awoke from anesthesia and taken to the PACU in stable condition.  Post Op Plan/Instructions: The patient will be weightbearing as tolerated to the left lower extremity.  He will receive postoperative Ancef .  He will receive Lovenox  for DVT prophylaxis and discharged on aspirin .  No hip precautions are needed.  Will have him mobilize with physical and Occupational Therapy.  I was present and  performed the entire surgery.  Franky Light, MD [facility]

## 2024-09-08 ENCOUNTER — Inpatient Hospital Stay (HOSPITAL_COMMUNITY)

## 2024-09-08 DIAGNOSIS — S72002D Fracture of unspecified part of neck of left femur, subsequent encounter for closed fracture with routine healing: Secondary | ICD-10-CM

## 2024-09-08 DIAGNOSIS — R29898 Other symptoms and signs involving the musculoskeletal system: Secondary | ICD-10-CM

## 2024-09-08 LAB — CBC
HCT: 35.3 % — ABNORMAL LOW (ref 39.0–52.0)
Hemoglobin: 11.7 g/dL — ABNORMAL LOW (ref 13.0–17.0)
MCH: 33.5 pg (ref 26.0–34.0)
MCHC: 33.1 g/dL (ref 30.0–36.0)
MCV: 101.1 fL — ABNORMAL HIGH (ref 80.0–100.0)
Platelets: UNDETERMINED K/uL (ref 150–400)
RBC: 3.49 MIL/uL — ABNORMAL LOW (ref 4.22–5.81)
RDW: 13.9 % (ref 11.5–15.5)
WBC: 9.7 K/uL (ref 4.0–10.5)
nRBC: 0 % (ref 0.0–0.2)

## 2024-09-08 LAB — COMPREHENSIVE METABOLIC PANEL WITH GFR
ALT: 18 U/L (ref 0–44)
AST: 30 U/L (ref 15–41)
Albumin: 3.4 g/dL — ABNORMAL LOW (ref 3.5–5.0)
Alkaline Phosphatase: 50 U/L (ref 38–126)
Anion gap: 10 (ref 5–15)
BUN: 16 mg/dL (ref 8–23)
CO2: 23 mmol/L (ref 22–32)
Calcium: 9.3 mg/dL (ref 8.9–10.3)
Chloride: 106 mmol/L (ref 98–111)
Creatinine, Ser: 1.03 mg/dL (ref 0.61–1.24)
GFR, Estimated: >60 mL/min (ref >60)
Glucose, Bld: 164 mg/dL — ABNORMAL HIGH (ref 70–99)
Potassium: 4.2 mmol/L (ref 3.5–5.1)
Sodium: 139 mmol/L (ref 135–145)
Total Bilirubin: 1.2 mg/dL (ref 0.0–1.2)
Total Protein: 5.4 g/dL — ABNORMAL LOW (ref 6.5–8.1)

## 2024-09-08 LAB — C-REACTIVE PROTEIN: CRP: 7 mg/dL — ABNORMAL HIGH (ref <1.0)

## 2024-09-08 LAB — SEDIMENTATION RATE: Sed Rate: 8 mm/h (ref 0–16)

## 2024-09-08 LAB — MAGNESIUM: Magnesium: 1.8 mg/dL (ref 1.7–2.4)

## 2024-09-08 LAB — VITAMIN B12: Vitamin B-12: 166 pg/mL — ABNORMAL LOW (ref 180–914)

## 2024-09-08 MED ORDER — PREDNISONE 10 MG (21) PO TBPK
10.0000 mg | ORAL_TABLET | ORAL | Status: AC
  Administered 2024-09-08: 10 mg via ORAL

## 2024-09-08 MED ORDER — DIAZEPAM 5 MG/ML IJ SOLN
2.5000 mg | INTRAMUSCULAR | Status: DC | PRN
  Administered 2024-09-08: 2.5 mg via INTRAVENOUS
  Filled 2024-09-08: qty 2

## 2024-09-08 MED ORDER — PREDNISONE 10 MG (21) PO TBPK
20.0000 mg | ORAL_TABLET | Freq: Every evening | ORAL | Status: AC
  Administered 2024-09-08: 20 mg via ORAL

## 2024-09-08 MED ORDER — PREDNISONE 10 MG (21) PO TBPK
20.0000 mg | ORAL_TABLET | Freq: Every morning | ORAL | Status: AC
  Administered 2024-09-08: 20 mg via ORAL
  Filled 2024-09-08 (×2): qty 21

## 2024-09-08 MED ORDER — PREDNISONE 10 MG (21) PO TBPK
10.0000 mg | ORAL_TABLET | Freq: Three times a day (TID) | ORAL | Status: AC
  Administered 2024-09-09 (×3): 10 mg via ORAL

## 2024-09-08 MED ORDER — TAMSULOSIN HCL 0.4 MG PO CAPS: 0.4000 mg | ORAL_CAPSULE | Freq: Once | ORAL | Status: DC | PRN

## 2024-09-08 MED ORDER — GADOBUTROL 1 MMOL/ML IV SOLN
7.0000 mL | Freq: Once | INTRAVENOUS | Status: AC | PRN
  Administered 2024-09-08: 7 mL via INTRAVENOUS

## 2024-09-08 MED ORDER — PREDNISONE 10 MG (21) PO TBPK
10.0000 mg | ORAL_TABLET | Freq: Four times a day (QID) | ORAL | Status: DC
  Administered 2024-09-10 (×2): 10 mg via ORAL

## 2024-09-08 MED ORDER — PREDNISONE 10 MG (21) PO TBPK
20.0000 mg | ORAL_TABLET | Freq: Every evening | ORAL | Status: AC
  Administered 2024-09-09: 20 mg via ORAL

## 2024-09-08 NOTE — Progress Notes (Signed)
 Subjective: Patient reports pain in the hip as minimal.  Tolerating diet.  Urinating via foley.   No CP, SOB.  Has mobilized OOB with PT/OT and did fairly well.  Was paged last night and again this AM about patient complaining of tingling in finger tips of R hand that was not present preop. Now also having trouble lifting R hand and flexing R elbow leading to patient being concerned. Primary team ordered xrays but no reads yet. No h/o previous issues with neck or RUE.  From my personal read of the 3v R shoulder, 4v R elbow, and 3v R wrist xrays, I see no evidence of fractures or dislocations in the shoulder, elbow, or wrist. Moderate AC joint OA seen with narrowing, sclerosis, and osteophytes present. Well preserved GH joint spacing. Mild radiocarpal OA and 1st MCP joint OA seen with sclerosis and bone demineralization present.   Objective:   VITALS:   Vitals:   09/08/24 0005 09/08/24 0404 09/08/24 0424 09/08/24 0817  BP: 106/63 108/81 (!) 95/55 115/63  Pulse: 62 87 86 91  Resp: 16 16 17 15   Temp: 98.3 F (36.8 C) 98.2 F (36.8 C) 97.7 F (36.5 C) 97.7 F (36.5 C)  TempSrc: Oral   Oral  SpO2: 95% 97% 98% 98%  Weight:      Height:          Latest Ref Rng & Units 09/08/2024    3:27 AM 09/06/2024   12:21 PM 09/03/2024    2:44 PM  CBC  WBC 4.0 - 10.5 K/uL 9.7  11.3  7.8   Hemoglobin 13.0 - 17.0 g/dL 88.2  85.2  84.6   Hematocrit 39.0 - 52.0 % 35.3  45.2  47.1   Platelets 150 - 400 K/uL PLATELET CLUMPS NOTED ON SMEAR, UNABLE TO ESTIMATE  142  151       Latest Ref Rng & Units 09/08/2024    3:27 AM 09/06/2024   12:21 PM 09/03/2024    2:44 PM  BMP  Glucose 70 - 99 mg/dL 835   885   BUN 8 - 23 mg/dL 16   17   Creatinine 9.38 - 1.24 mg/dL 8.96  8.98  8.64   Sodium 135 - 145 mmol/L 139   144   Potassium 3.5 - 5.1 mmol/L 4.2   4.6   Chloride 98 - 111 mmol/L 106   107   CO2 22 - 32 mmol/L 23   26   Calcium 8.9 - 10.3 mg/dL 9.3   9.8    Intake/Output      09/12 0701 09/13 0700  09/13 0701 09/14 0700   P.O. 440    I.V. (mL/kg) 1000 (14.7)    IV Piggyback 300    Total Intake(mL/kg) 1740 (25.6)    Urine (mL/kg/hr) 1175 (0.7)    Blood 350    Total Output 1525    Net +215            Physical Exam: General: NAD.  Laying supine in bed, calm, comfortable, talkative Resp: No increased wob Cardio: regular rate and rhythm ABD soft Neurologically intact MSK Neurovascularly intact Sensation intact distally Intact pulses distally Dorsiflexion/Plantar flexion intact Incision: dressing C/D/I  R shoulder - non-TTP, full ROM in all directions, 5/5 strength, negative cuff tests R elbow - non-TTP, full ROM, 5/5 strength, negative Tinel's test R wrist - non-TTP, full ROM, 5/5 strength, negative Tinel's test, negative Phalen's test R hand - non-TTP, full ROM, equal grip strength, no  edema or erythema, patient reporting decreased sensation and tingling in distal phalanges 1-5 on palmar side only from DIP joints through finger tufts RUE intention tremor present when reaching for any objects and trying fine motor movements like unscrewing a cap  C-spine non-TTP, full ROM in all directions  Assessment: 1 Day Post-Op  S/P Procedure(s) (LRB): ARTHROPLASTY, HIP, TOTAL, ANTERIOR APPROACH, LEFT (N/A) by Dr. Kendal on 09/07/24  Principal Problem:   Closed left hip fracture (HCC) Active Problems:   Preoperative clearance   Plan: Need to make sure primary team has worked him up/ruled out a stroke as the cause of his new RUE symptoms  If negative for stroke then recommend ordering MRI C-spine w/o contrast to look for nerve impingement.  Explained that symptoms could be d/t patient positioning during surgery and should be transient if that is the cause.  Will d/c foley Start prednisone  dose pack for new nerve symptoms. Already on Gabapentin . Advance diet Up with therapy Incentive Spirometry Elevate and Apply ice  Weightbearing: WBAT LLE Insicional and dressing  care: Dressings left intact until follow-up and Reinforce dressings as needed Orthopedic device(s): None Showering: Keep dressing dry VTE prophylaxis: Lovenox  40mg  qd, SCDs, ambulation Pain control: PRN Follow - up plan: 2 weeks postop in office with Dr. Kendal Pass information for today:  Evalene Chancy MD, Gerard Large PA-C  Dispo: PT recommending HHPT.      Gerard CHRISTELLA Large, PA-C Office [phone] 09/08/2024, 9:56 AM

## 2024-09-08 NOTE — Progress Notes (Signed)
 Per patient, tingling and difficulty of lifting/bending rt arm is about the same. RONAL Horns, NP updated.

## 2024-09-08 NOTE — Evaluation (Signed)
 Occupational Therapy Evaluation Patient Details Name: [name] MRN: [id number] DOB: 1933/10/09 Today's Date: 09/08/2024   History of Present Illness   [name] is a 89 y.o. male admitted 09/06/24 after he tripped and fell walking to his bed in pre-op holding prior to kyphoplasty with Dr. Burnetta, sustaining left displaced femoral neck fracture. Kyphoplasty was cancelled. Pt s/p L THA 9/12. PMHx: A-Fib, CAD, PAD, chronic diastolic CHF, HLD, HTN, osteopenia, and prostate CA.     Clinical Impressions Pt was active, independent in self care, light meal prep and driving prior to admission. He presents with post operative pain, impaired standing balance requiring CGA and RW. He is mixed dominance and self feeds typically with his R hand. Pt is experiencing R elbow and forearm weakness and numbness in R fingertips since surgery, awaiting imaging results. Began educating pt and family in AE and compensatory strategies for LB ADLs, use of 3 in 1 over toilet to provide handles and shower transfer (verbally). Recommending HHOT upon discharge. Will follow acutely.      If plan is discharge home, recommend the following:   A little help with walking and/or transfers;A lot of help with bathing/dressing/bathroom;Assistance with cooking/housework;Assist for transportation;Help with stairs or ramp for entrance     Functional Status Assessment   Patient has had a recent decline in their functional status and demonstrates the ability to make significant improvements in function in a reasonable and predictable amount of time.     Equipment Recommendations   None recommended by OT (may need 3 in 1)     Recommendations for Other Services         Precautions/Restrictions   Precautions Precautions: Fall Recall of Precautions/Restrictions: Intact Restrictions Weight Bearing Restrictions Per Provider Order: Yes LLE Weight Bearing Per Provider Order: Weight bearing as tolerated      Mobility Bed Mobility                    Transfers Overall transfer level: Needs assistance Equipment used: Rolling walker (2 wheels) Transfers: Sit to/from Stand Sit to Stand: Contact guard assist                  Balance Overall balance assessment: Needs assistance   Sitting balance-Leahy Scale: Fair         Standing balance comment: Pt dependent on RW                           ADL either performed or assessed with clinical judgement   ADL Overall ADL's : Needs assistance/impaired Eating/Feeding: Independent;Sitting Eating/Feeding Details (indicate cue type and reason): pt typically feeds himself with his non dominant R hand Grooming: Set up;Sitting Grooming Details (indicate cue type and reason): pt typically brushes his teeth with his L hand Upper Body Bathing: Minimal assistance;Sitting   Lower Body Bathing: Moderate assistance;Sit to/from stand   Upper Body Dressing : Set up;Sitting   Lower Body Dressing: Moderate assistance                 General ADL Comments: Began educating pt and family in use of AE and compensatory strategies for ADLs.     Vision Ability to See in Adequate Light: 0 Adequate Patient Visual Report: No change from baseline       Perception         Praxis         Pertinent Vitals/Pain Pain Assessment Pain Assessment: Faces Faces Pain  Scale: Hurts little more Pain Location: L hip Pain Descriptors / Indicators: Discomfort Pain Intervention(s): Monitored during session, Repositioned     Extremity/Trunk Assessment Upper Extremity Assessment Upper Extremity Assessment: Left hand dominant;RUE deficits/detail RUE Deficits / Details: 5/5 shoulder, pronation, wrist, gross grip strength, 3/5 supination and elbow flexion, 4/5 elbow extension, reports numbness in finger tips RUE Coordination: decreased fine motor;decreased gross motor   Lower Extremity Assessment Lower Extremity Assessment: Defer to PT  evaluation   Cervical / Trunk Assessment Cervical / Trunk Assessment: Kyphotic Cervical / Trunk Exceptions: Pt was scheduled to have kyphoplasty.   Communication Communication Communication: No apparent difficulties   Cognition Arousal: Alert Behavior During Therapy: WFL for tasks assessed/performed Cognition: No apparent impairments                               Following commands: Intact       Cueing  General Comments   Cueing Techniques: Verbal cues      Exercises     Shoulder Instructions      Home Living Family/patient expects to be discharged to:: Private residence Living Arrangements: Children;Other relatives (daughter and son in law) Available Help at Discharge: Family;Available 24 hours/day Type of Home: House Home Access: Stairs to enter [employer] of Steps: 4 Entrance Stairs-Rails: Right Home Layout: Two level;Able to live on main level with bedroom/bathroom;Full bath on main level Alternate Level Stairs-Number of Steps: Flight Alternate Level Stairs-Rails: Right;Left;Can reach both Bathroom Shower/Tub: Producer, television/film/video: Handicapped height     Home Equipment: Hand held shower head;Shower seat          Prior Functioning/Environment Prior Level of Function : Independent/Modified Independent;Driving             Mobility Comments: active ADLs Comments: sits to shower, prepares light meals    OT Problem List: Decreased strength;Impaired balance (sitting and/or standing);Decreased coordination;Decreased knowledge of use of DME or AE;Impaired UE functional use;Pain   OT Treatment/Interventions: Self-care/ADL training;DME and/or AE instruction;Therapeutic activities;Patient/family education;Balance training;Therapeutic exercise      OT Goals(Current goals can be found in the care plan section)   Acute Rehab OT Goals OT Goal Formulation: With patient Time For Goal Achievement: 09/22/24 Potential to  Achieve Goals: Good ADL Goals Pt Will Perform Eating: with modified independence;sitting (with R hand lead) Pt Will Perform Grooming: with supervision;standing Pt Will Perform Lower Body Bathing: with supervision;with adaptive equipment;sit to/from stand Pt Will Perform Lower Body Dressing: with supervision;with adaptive equipment;sit to/from stand Pt Will Transfer to Toilet: with supervision;ambulating (handicap height toilet) Pt Will Perform Toileting - Clothing Manipulation and hygiene: with supervision;sit to/from stand Pt Will Perform Tub/Shower Transfer: Shower transfer;with supervision;ambulating;shower seat Pt/caregiver will Perform Home Exercise Program: Increased strength;Right Upper extremity;Independently;With written HEP provided   OT Frequency:  Min 2X/week    Co-evaluation              AM-PAC OT 6 Clicks Daily Activity     Outcome Measure Help from another person eating meals?: None Help from another person taking care of personal grooming?: A Little Help from another person toileting, which includes using toliet, bedpan, or urinal?: A Little Help from another person bathing (including washing, rinsing, drying)?: A Lot Help from another person to put on and taking off regular upper body clothing?: A Little Help from another person to put on and taking off regular lower body clothing?: A Lot 6 Click Score: 17  End of Session    Activity Tolerance: Patient tolerated treatment well Patient left: in bed;with call bell/phone within reach;with family/visitor present;Other (comment) (ortho PA in room)  OT Visit Diagnosis: Unsteadiness on feet (R26.81);Other abnormalities of gait and mobility (R26.89);Muscle weakness (generalized) (M62.81);Pain                Time: 1448-1520 OT Time Calculation (min): 32 min Charges:  OT General Charges $OT Visit: 1 Visit OT Evaluation $OT Eval Moderate Complexity: 1 Mod OT Treatments $Self Care/Home Management : 8-22  mins  [name], [name] Acute Rehabilitation Services Office: [phone]   [name] 09/08/2024, 4:10 PM

## 2024-09-08 NOTE — Progress Notes (Signed)
 RONAL Horns, NP, on call for primary team also notified of pt's symptoms tonight. Said that she read this writer's note and Rapid response RN notes. Will keep her updated for any other changes.

## 2024-09-08 NOTE — Evaluation (Signed)
 Physical Therapy Evaluation Patient Details Name: [name] MRN: [id number] DOB: 02/26/1933 Today's Date: 09/08/2024  History of Present Illness  [name] is a 89 y.o. male admitted 09/06/24 after he tripped and fell walking to his bed in pre-op holding prior to kyphoplasty with Dr. Burnetta, sustaining left displaced femoral neck fracture. Kyphoplasty was cancelled. Pt s/p L THA 9/12. PMHx: A-Fib, CAD, PAD, chronic diastolic CHF, HLD, HTN, osteopenia, and prostate CA.   Clinical Impression  Pt admitted with above diagnosis. PTA, pt was independent with functional mobility, ADLs, IADLs, and driving. He reports walking ~2 miles each day either around the mall or his neighborhood. He lives with his daughter and son-in-law in a two story house with 4-5 STE with rail(s). Pt currently with functional limitations due to the deficits listed below (see PT Problem List). He required CGA for bed mobility, transfers, gait, and stairs using RW. Pt ambulated ~226ft using RW with a step-through gait pattern maintaining LLE in knee flexion. He was slightly unsteady and benefited from cues to improve safety awareness and technique. Educated pt on stair training in accordance with his home set-up. Instructed pt to ascend with RLE and descend with LLE. He completed a flight of stairs with unilateral handrail and HHA. Pt will benefit from acute skilled PT to increase his independence and safety with mobility to allow discharge. Recommend HHPT to increase ROM/strength, improve balance, decrease fall risk, and optimize safety within the home environment.     If plan is discharge home, recommend the following: A little help with walking and/or transfers;A little help with bathing/dressing/bathroom;Assistance with cooking/housework;Assist for transportation;Help with stairs or ramp for entrance   Can travel by private vehicle        Equipment Recommendations Rolling walker (2 wheels)  Recommendations for Other  Services       Functional Status Assessment Patient has had a recent decline in their functional status and demonstrates the ability to make significant improvements in function in a reasonable and predictable amount of time.     Precautions / Restrictions Precautions Precautions: Fall Recall of Precautions/Restrictions: Intact Precaution/Restrictions Comments: No hip precautions      Mobility  Bed Mobility Overal bed mobility: Needs Assistance Bed Mobility: Supine to Sit     Supine to sit: HOB elevated, Used rails, Contact guard     General bed mobility comments: Pt sat up on R side of bed with increased time. He brougth RLE off EOB. Cues for sequencing and to use bed rail. Light assist to manage LLE and elevate trunk. Pt scooted fwd with BUE support.    Transfers Overall transfer level: Needs assistance Equipment used: Rolling walker (2 wheels) Transfers: Sit to/from Stand Sit to Stand: Contact guard assist           General transfer comment: Pt stood from lowest bed height. Cued proper hand placement using RW. Powered up with CGA. Good eccentric control with sitting.    Ambulation/Gait Ambulation/Gait assistance: Contact guard assist Gait Distance (Feet): 250 Feet Assistive device: Rolling walker (2 wheels) Gait Pattern/deviations: Step-through pattern, Decreased stride length, Knee flexed in stance - left, Trunk flexed, Drifts right/left Gait velocity: decr Gait velocity interpretation: <1.8 ft/sec, indicate of risk for recurrent falls   General Gait Details: Pt ambulated with short slow steps. He maintained LLE in slight knee flex throughout, which he reports is baseline. Pt with fwd lean. Cues for upright posture and proximity to RW. Pt had a tendency to fixate gaze at floor  or turn to speak with therapist. He was slightly unsteady. Cued forward gaze and emphasized scanning the environment. Even weight shift and good foot clearence.  Stairs Stairs: Yes Stairs  assistance: Contact guard assist Stair Management: One rail Right, Forwards, Step to pattern Number of Stairs: 10 General stair comments: Educated pt to ascend with RLE and descend with LLE. Provided HHA on LUE while going up and pt held onto PT's shoulder while going down. He took one step at a time. No LOB.  Wheelchair Mobility     Tilt Bed    Modified Rankin (Stroke Patients Only)       Balance Overall balance assessment: Needs assistance Sitting-balance support: Single extremity supported, Feet supported Sitting balance-Leahy Scale: Fair Sitting balance - Comments: Pt sat EOB with supervision.   Standing balance support: Bilateral upper extremity supported, During functional activity, Reliant on assistive device for balance Standing balance-Leahy Scale: Poor Standing balance comment: Pt dependent on RW                             Pertinent Vitals/Pain Pain Assessment Pain Assessment: 0-10 Pain Score: 3  Pain Location: L hip with gait Pain Descriptors / Indicators: Discomfort, Aching, Sore Pain Intervention(s): Premedicated before session, Monitored during session, Limited activity within patient's tolerance, Repositioned    Home Living Family/patient expects to be discharged to:: Private residence Living Arrangements: Children;Other relatives (Daughter and Son-in-Law) Available Help at Discharge: Family;Available PRN/intermittently;Available 24 hours/day (Daughter works part-time from [employer]; Son-in-Law works) Type of Home: [employer] Access: Stairs to enter [employer]: Right (front door has B rails; garage only one rail) Secretary/administrator of Steps: 4 (front door 5; garage 4) Alternate Level Stairs-Number of Steps: Flight Home Layout: Two level;Able to live on main level with bedroom/bathroom;Full bath on main level Home Equipment: Hand held shower head;Shower seat      Prior Function Prior Level of Function : Independent/Modified  Independent;Driving             Mobility Comments: Ambulates without AD. Active - walks about 2 miles a day. Will walk in the mall or around the neighborhood. He has had 2 falls in the past 27mo d/t tripping, 1 resulting in current admission. ADLs Comments: Indep with ADLs. He is able to make small meals, mainly reheating stuff in the microwave. Daughter manages IADLs. Son-in-law manages yard work.     Extremity/Trunk Assessment   Upper Extremity Assessment Upper Extremity Assessment: Defer to OT evaluation    Lower Extremity Assessment Lower Extremity Assessment: LLE deficits/detail LLE Deficits / Details: Pt POD 1 s/p THA. Limited hip and knee AROM d/t pain. Ankle AROM/strength [facility]. Grossly 3/5 strength, did not resist d/t pt grimacing with AROM. LLE: Unable to fully assess due to pain LLE Sensation: decreased proprioception LLE Coordination: decreased gross motor    Cervical / Trunk Assessment Cervical / Trunk Assessment: Kyphotic;Other exceptions Cervical / Trunk Exceptions: Pt was scheduled to have kyphoplasty.  Communication   Communication Communication: No apparent difficulties    Cognition Arousal: Alert Behavior During Therapy: WFL for tasks assessed/performed   PT - Cognitive impairments: No apparent impairments                       PT - Cognition Comments: Pt A,Ox4 Following commands: Intact       Cueing Cueing Techniques: Verbal cues, Tactile cues     General Comments General comments (skin integrity, edema, etc.):  VSS on RA    Exercises     Assessment/Plan    PT Assessment Patient needs continued PT services  PT Problem List Decreased strength;Decreased range of motion;Decreased activity tolerance;Decreased balance;Decreased mobility;Decreased knowledge of use of DME;Decreased safety awareness       PT Treatment Interventions DME instruction;Gait training;Stair training;Functional mobility training;Therapeutic activities;Therapeutic  exercise;Balance training;Patient/family education    PT Goals (Current goals can be found in the Care Plan section)  Acute Rehab PT Goals Patient Stated Goal: Return Home PT Goal Formulation: With patient Time For Goal Achievement: 09/22/24 Potential to Achieve Goals: Good    Frequency Min 2X/week     Co-evaluation               AM-PAC PT 6 Clicks Mobility  Outcome Measure Help needed turning from your back to your side while in a flat bed without using bedrails?: A Little Help needed moving from lying on your back to sitting on the side of a flat bed without using bedrails?: A Little Help needed moving to and from a bed to a chair (including a wheelchair)?: A Little Help needed standing up from a chair using your arms (e.g., wheelchair or bedside chair)?: A Little Help needed to walk in hospital room?: A Little Help needed climbing 3-5 steps with a railing? : A Little 6 Click Score: 18    End of Session Equipment Utilized During Treatment: Gait belt Activity Tolerance: Patient tolerated treatment well Patient left: in chair;with call bell/phone within reach;with chair alarm set Nurse Communication: Mobility status PT Visit Diagnosis: Difficulty in walking, not elsewhere classified (R26.2);Other abnormalities of gait and mobility (R26.89);Unsteadiness on feet (R26.81)    Time: 8896-8864 PT Time Calculation (min) (ACUTE ONLY): 32 min   Charges:   PT Evaluation $PT Eval Low Complexity: 1 Low PT Treatments $Gait Training: 8-22 mins PT General Charges $$ ACUTE PT VISIT: 1 Visit         Randall SAUNDERS, PT, DPT Acute Rehabilitation Services Office: [phone] Secure Chat Preferred  Delon CHRISTELLA Callander 09/08/2024, 12:28 PM

## 2024-09-08 NOTE — Progress Notes (Signed)
 PROGRESS NOTE  [name] FMW:[id number] DOB: 1933-08-08 DOA: 09/06/2024 PCP: [name], MD   LOS: 2 days   Brief Narrative / Interim history: This is an 89 year old male with history of CAD with PCI to LAD in 2009, PAF, chronic diastolic CHF, HTN, HLD, prostate cancer with cryotherapy in 2009 comes into the hospital with fall resulting in left hip fracture.  He has been having some chronic lumbar back pain, he was scheduled to have kyphoplasty as an outpatient, and in the hospital, in the preop area he fell and fractured his hip.  His kyphoplasty was canceled and he was admitted to the hospitalist service  Subjective / 24h Interval events: Pt and family are complainig of changes in mobility to the pateint's right upper extremity following surgery. The patient describes and demonstrates rigidity and give away weakness of the limb. This appears to be centered around the patient's elbow. He also describes numbness and tingling to digits of right hand. CT of the C spine and MRI brain have been ordered and neurology has been consulted. X-rays of the shoulder, elbow, and wrist of the right upper extermity have been ordered.  Assesement and Plan: Principal problem Left hip fracture-orthopedic surgery consulted, he will be taken to the OR this morning for operative repair.  Monitor postoperatively for pain control, DVT prophylaxis per Ortho, daily labs and awaiting postop PT evaluation to determine disposition  Active problems  Weakness/lack of mobility of the right upper extreity:Pt and family are complainig of changes in mobility to the pateint's right upper extremity following surgery. The patient describes and demonstrates rigidity and give away weakness of the limb. This appears to be centered around the patient's elbow. He also describes numbness and tingling to digits of right hand. CT of the C spine and MRI brain have been ordered and neurology has been consulted. X-rays of the shoulder,  elbow, and wrist of the right upper extermity have been ordered.  PAF-hold Xarelto  preoperatively, resume once safe per Ortho surgery  CAD-status post DES to LAD 2009.  He has no chest pain, prior to his fall he was feeling at baseline.  Cardiology evaluated patient preoperatively on 9/11.  Chronic combined CHF-most recent 2D echo August 2025 showed LVEF 35-40%, global hypokinesis, mildly reduced RV, normal PA pressure, severe left atrial enlargement, moderate MR.  Currently appears euvolemic and compensated.  Apparently has not tolerated beta-blockers in the past due to fatigue, his GDMT with Entresto , spironolactone  is on hold perioperatively.  Resume postoperatively if blood pressure tolerates  Mild AKI-creatinine 1.3 on admission, baseline 1.  Returned to baseline this morning  Back pain-was scheduled to have lumbar kyphoplasty on the day of admission.  This is now on hold per orthopedic surgery  Scheduled Meds:  acetaminophen   1,000 mg Oral TID   Chlorhexidine  Gluconate Cloth  6 each Topical Daily   cholecalciferol   2,000 Units Oral QPM   dapagliflozin  propanediol  10 mg Oral QAC breakfast   enoxaparin  (LOVENOX ) injection  40 mg Subcutaneous Q24H   gabapentin   300 mg Oral TID   methocarbamol   500 mg Oral TID   mupirocin  ointment  1 Application Nasal BID   predniSONE   10 mg Oral PC lunch   predniSONE   10 mg Oral PC supper   [START ON 09/09/2024] predniSONE   10 mg Oral 3 x daily with food   [START ON 09/10/2024] predniSONE   10 mg Oral 4X daily taper   predniSONE   20 mg Oral AC breakfast   predniSONE   20 mg Oral Nightly   [START ON 09/09/2024] predniSONE   20 mg Oral Nightly   simvastatin   40 mg Oral QHS   Continuous Infusions: PRN Meds:.diazepam , oxyCODONE , tamsulosin   Current Outpatient Medications  Medication Instructions   calcitonin, salmon, (MIACALCIN/FORTICAL) 200 UNIT/ACT nasal spray 1 spray, Daily   Cholecalciferol  (VITAMIN D3) 2000 UNITS TABS 2,000 Int'l Units, Every  evening   dapagliflozin  propanediol (FARXIGA ) 10 mg, Oral, Daily before breakfast   methocarbamol  (ROBAXIN ) 500-1,000 mg, Oral, Every 6 hours PRN   rivaroxaban  (XARELTO ) 20 MG TABS tablet TAKE 1 TABLET BY MOUTH DAILY WITH SUPPER   sacubitril -valsartan  (ENTRESTO ) 97-103 MG 1 tablet, Oral, 2 times daily   simvastatin  (ZOCOR ) 40 mg, Oral, Daily at bedtime   spironolactone  (ALDACTONE ) 12.5 mg, Oral, Daily    Diet Orders (From admission, onward)     Start     Ordered   09/07/24 1753  Diet regular Room service appropriate? Yes; Fluid consistency: Thin  Diet effective now       Question Answer Comment  Room service appropriate? Yes   Fluid consistency: Thin      09/07/24 1752           DVT prophylaxis: enoxaparin  (LOVENOX ) injection 40 mg Start: 09/06/24 0800  Lab Results  Component Value Date   PLT PLATELET CLUMPS NOTED ON SMEAR, UNABLE TO ESTIMATE 09/08/2024      Code Status: Full Code  Family Communication: no family at bedside   Status is: Inpatient Remains inpatient appropriate because: Surgery today  Level of care: Med-Surg  Consultants:  Orthopedic surgery  Cardiology  Neurology  Objective: Vitals:   09/08/24 0005 09/08/24 0404 09/08/24 0424 09/08/24 0817  BP: 106/63 108/81 (!) 95/55 115/63  Pulse: 62 87 86 91  Resp: 16 16 17 15   Temp: 98.3 F (36.8 C) 98.2 F (36.8 C) 97.7 F (36.5 C) 97.7 F (36.5 C)  TempSrc: Oral   Oral  SpO2: 95% 97% 98% 98%  Weight:      Height:        Intake/Output Summary (Last 24 hours) at 09/08/2024 1755 Last data filed at 09/08/2024 1300 Gross per 24 hour  Intake 1000 ml  Output 1100 ml  Net -100 ml   Wt Readings from Last 3 Encounters:  09/07/24 68 kg  09/03/24 66.8 kg  08/20/24 67.1 kg    Examination:  Exam:  Constitutional:  The patient is awake, alert, and oriented x 3. No acute distress. Eyes:  pupils and irises appear normal Normal lids and conjunctivae ENMT:  grossly normal hearing  Lips appear  normal external ears, nose appear normal Oropharynx: mucosa, tongue,posterior pharynx appear normal Neck:  neck appears normal, no masses, normal ROM, supple no thyromegaly Respiratory:  No increased work of breathing. No wheezes, rales, or rhonchi No tactile fremitus Cardiovascular:  Regular rate and rhythm No murmurs, ectopy, or gallups. No lateral PMI. No thrills. Abdomen:  Abdomen is soft, non-tender, non-distended No hernias, masses, or organomegaly Normoactive bowel sounds.  Musculoskeletal:  No cyanosis, clubbing, or edema Skin:  No rashes, lesions, ulcers palpation of skin: no induration or nodules Neurologic:  CN 2-12 intact Patient is unable to extend right forearm at the elbow. He also has limited mobility at the shoulder. There are paresthesias to the digits of his right hand. He does maintain fine motor dexterity.  Psychiatric:  Mental status Mood, affect appropriate Orientation to person, place, time  judgment and insight appear intact    Data Reviewed: I have independently reviewed  following labs and imaging studies   CBC Recent Labs  Lab 09/03/24 1444 09/06/24 1221 09/08/24 0327  WBC 7.8 11.3* 9.7  HGB 15.3 14.7 11.7*  HCT 47.1 45.2 35.3*  PLT 151 142* PLATELET CLUMPS NOTED ON SMEAR, UNABLE TO ESTIMATE  MCV 102.6* 100.7* 101.1*  MCH 33.3 32.7 33.5  MCHC 32.5 32.5 33.1  RDW 13.6 13.5 13.9    Recent Labs  Lab 09/03/24 1444 09/06/24 1221 09/08/24 0327  NA 144  --  139  K 4.6  --  4.2  CL 107  --  106  CO2 26  --  23  GLUCOSE 114*  --  164*  BUN 17  --  16  CREATININE 1.35* 1.01 1.03  CALCIUM 9.8  --  9.3  AST  --   --  30  ALT  --   --  18  ALKPHOS  --   --  50  BILITOT  --   --  1.2  ALBUMIN   --   --  3.4*  MG  --   --  1.8    ------------------------------------------------------------------------------------------------------------------ No results for input(s): CHOL, HDL, LDLCALC, TRIG, CHOLHDL, LDLDIRECT in the  last 72 hours.  No results found for: HGBA1C ------------------------------------------------------------------------------------------------------------------ No results for input(s): TSH, T4TOTAL, T3FREE, THYROIDAB in the last 72 hours.  Invalid input(s): FREET3  Cardiac Enzymes No results for input(s): CKMB, TROPONINI, MYOGLOBIN in the last 168 hours.  Invalid input(s): CK ------------------------------------------------------------------------------------------------------------------    Component Value Date/Time   BNP 449.4 (H) 09/05/2023 0529    CBG: Recent Labs  Lab 09/07/24 2140  GLUCAP 234*    Recent Results (from the past 240 hours)  Surgical pcr screen     Status: Abnormal   Collection Time: 09/03/24  3:24 PM   Specimen: Nasal Mucosa; Nasal Swab  Result Value Ref Range Status   MRSA, PCR NEGATIVE NEGATIVE Final   Staphylococcus aureus POSITIVE (A) NEGATIVE Final    Comment: (NOTE) The Xpert SA Assay (FDA approved for NASAL specimens in patients 50 years of age and older), is one component of a comprehensive surveillance program. It is not intended to diagnose infection nor to guide or monitor treatment. Performed at [facility], [address] [place], [place] [address]      Radiology Studies: DG Wrist Complete Right Result Date: 09/08/2024 CLINICAL DATA:  [id number] Stiffness in joint [id number] EXAM: RIGHT WRIST - COMPLETE 3+ VIEW COMPARISON:  None Available. FINDINGS: Diffusely decreased bone density. There is no evidence of fracture or dislocation. There is no evidence of severe arthropathy or other focal bone abnormality. Soft tissues are unremarkable. Vascular calcification. IMPRESSION: No acute displaced fracture or dislocation. Electronically Signed   By: Morgane  Naveau M.D.   On: 09/08/2024 17:13   DG Shoulder Right Result Date: 09/08/2024 CLINICAL DATA:  [id number] Stiffness in joint [id number] EXAM: RIGHT SHOULDER - 2+ VIEW COMPARISON:   None Available. FINDINGS: There is no evidence of fracture or dislocation. There is no evidence of arthropathy or other focal bone abnormality. Soft tissues are unremarkable. IMPRESSION: Negative. Electronically Signed   By: Morgane  Naveau M.D.   On: 09/08/2024 17:12   DG ELBOW COMPLETE RIGHT (3+VIEW) Result Date: 09/08/2024 CLINICAL DATA:  [id number] Stiffness in joint [id number] EXAM: RIGHT ELBOW - COMPLETE 3+ VIEW COMPARISON:  None Available. FINDINGS: Elbow joint effusion. There is no evidence of arthropathy or other focal bone abnormality. Soft tissues are unremarkable. IMPRESSION: Elbow joint effusion with no definite acute displaced fracture. An occult  fracture is possible. Electronically Signed   By: Morgane  Naveau M.D.   On: 09/08/2024 17:11   Yina Riviere, DO [facility] 6:15 PM  Between 7 am - 7 pm I am available, please contact me via Amion (for emergencies) or Securechat (non urgent messages)  Between 7 pm - 7 am I am not available, please contact night coverage MD/APP via Amion

## 2024-09-09 DIAGNOSIS — I509 Heart failure, unspecified: Secondary | ICD-10-CM | POA: Diagnosis not present

## 2024-09-09 DIAGNOSIS — S72002D Fracture of unspecified part of neck of left femur, subsequent encounter for closed fracture with routine healing: Secondary | ICD-10-CM | POA: Diagnosis not present

## 2024-09-09 DIAGNOSIS — I4819 Other persistent atrial fibrillation: Secondary | ICD-10-CM | POA: Diagnosis not present

## 2024-09-09 DIAGNOSIS — I2510 Atherosclerotic heart disease of native coronary artery without angina pectoris: Secondary | ICD-10-CM

## 2024-09-09 DIAGNOSIS — Z955 Presence of coronary angioplasty implant and graft: Secondary | ICD-10-CM

## 2024-09-09 DIAGNOSIS — I5022 Chronic systolic (congestive) heart failure: Secondary | ICD-10-CM

## 2024-09-09 LAB — CBC WITH DIFFERENTIAL/PLATELET
Abs Immature Granulocytes: 0.08 K/uL — ABNORMAL HIGH (ref 0.00–0.07)
Basophils Absolute: 0 K/uL (ref 0.0–0.1)
Basophils Relative: 0 %
Eosinophils Absolute: 0 K/uL (ref 0.0–0.5)
Eosinophils Relative: 0 %
HCT: 35.7 % — ABNORMAL LOW (ref 39.0–52.0)
Hemoglobin: 11.7 g/dL — ABNORMAL LOW (ref 13.0–17.0)
Immature Granulocytes: 1 %
Lymphocytes Relative: 7 %
Lymphs Abs: 0.9 K/uL (ref 0.7–4.0)
MCH: 33.3 pg (ref 26.0–34.0)
MCHC: 32.8 g/dL (ref 30.0–36.0)
MCV: 101.7 fL — ABNORMAL HIGH (ref 80.0–100.0)
Monocytes Absolute: 0.5 K/uL (ref 0.1–1.0)
Monocytes Relative: 4 %
Neutro Abs: 10.9 K/uL — ABNORMAL HIGH (ref 1.7–7.7)
Neutrophils Relative %: 88 %
Platelets: 93 K/uL — ABNORMAL LOW (ref 150–400)
RBC: 3.51 MIL/uL — ABNORMAL LOW (ref 4.22–5.81)
RDW: 14 % (ref 11.5–15.5)
WBC: 12.4 K/uL — ABNORMAL HIGH (ref 4.0–10.5)
nRBC: 0 % (ref 0.0–0.2)

## 2024-09-09 LAB — BASIC METABOLIC PANEL WITH GFR
Anion gap: 6 (ref 5–15)
BUN: 22 mg/dL (ref 8–23)
CO2: 26 mmol/L (ref 22–32)
Calcium: 9.5 mg/dL (ref 8.9–10.3)
Chloride: 109 mmol/L (ref 98–111)
Creatinine, Ser: 1.16 mg/dL (ref 0.61–1.24)
GFR, Estimated: >60 mL/min (ref >60)
Glucose, Bld: 149 mg/dL — ABNORMAL HIGH (ref 70–99)
Potassium: 5.5 mmol/L — ABNORMAL HIGH (ref 3.5–5.1)
Sodium: 141 mmol/L (ref 135–145)

## 2024-09-09 MED ORDER — LACTULOSE 10 GM/15ML PO SOLN
20.0000 g | Freq: Two times a day (BID) | ORAL | Status: DC
  Administered 2024-09-09 (×2): 20 g via ORAL
  Filled 2024-09-09 (×3): qty 30

## 2024-09-09 MED ORDER — SODIUM ZIRCONIUM CYCLOSILICATE 10 G PO PACK
10.0000 g | PACK | Freq: Once | ORAL | Status: AC
  Administered 2024-09-09: 10 g via ORAL
  Filled 2024-09-09: qty 1

## 2024-09-09 NOTE — Progress Notes (Signed)
 Mobility Specialist Progress Note:    09/09/24 1101  Mobility  Activity Ambulated with assistance (In hallway)  Level of Assistance Contact guard assist, steadying assist  Assistive Device Front wheel walker  Distance Ambulated (ft) 220 ft (x2)  LLE Weight Bearing Per Provider Order WBAT  Activity Response Tolerated well  Mobility Referral Yes  Mobility visit 1 Mobility  Mobility Specialist Start Time (ACUTE ONLY) 1013  Mobility Specialist Stop Time (ACUTE ONLY) 1031  Mobility Specialist Time Calculation (min) (ACUTE ONLY) 18 min   Received pt in chair and eager for mobility. No physical assistance needed. C/o LLE soreness, otherwise tolerated well. Returned to room without fault. Pt left in chair. Personal belongings and call light within reach. All needs met.  Lavanda Pollack Mobility Specialist  Please contact via [employer] or  Rehab Office [phone]

## 2024-09-09 NOTE — Progress Notes (Signed)
 PROGRESS NOTE  [name] FMW:[id number] DOB: July 04, 1933 DOA: 09/06/2024 PCP: [name], MD   LOS: 3 days   Brief Narrative / Interim history: This is an 89 year old male with history of CAD with PCI to LAD in 2009, PAF, chronic diastolic CHF, HTN, HLD, prostate cancer with cryotherapy in 2009 comes into the hospital with fall resulting in left hip fracture.  He has been having some chronic lumbar back pain, he was scheduled to have kyphoplasty as an outpatient, and in the hospital, in the preop area he fell and fractured his hip.  His kyphoplasty was canceled and he was admitted to the hospitalist service  Subjective / 24h Interval events: Pt and family are complainig of changes in mobility to the pateint's right upper extremity following surgery. The patient describes and demonstrates rigidity and give away weakness of the limb. This appears to be centered around the patient's elbow. He also describes numbness and tingling to digits of right hand. CT of the C spine and MRI brain have been ordered and neurology has been consulted. X-rays of the shoulder, elbow, and wrist of the right upper extermity have been ordered.  On 09/09/2024 tX-ray of the elbow demonstrates an effusion of the left elbow, but no evidence of fracture or dislocation. On the right elbow there is no evidence of arthropathy or other focal bone abnormality. The soft tissues were abnormality. The right shoulder demonstrates no evidence of fracture or dislocation. No evidence of arthropathy or other focal bone abnormality. The right wrist demonstrates diffusely decreased bone density. There is no evidence of fracture or dislocation.   MRI brain demonstrates no acute intracaranial abnormality with no evidence for intracranial metastatic disease. There is mild age-related cerebral atrophy with chronic small vessel ischemic disease. There is also mucosal thickening with pneumotized secretions within the left phenoid sinus which  could reflect changes of acute sinusitis.   Neurosurgery was contacted to review the films by neurology. They have stated that they do not seen anything that would require inhospital evaluation or treatment. They have recommended follow up with neurosurgery as outpatient.  Orthopedic surgery PA Duwaine Large has placed the patient on steroids to improve his mobility.  Assesement and Plan: Principal problem Left hip fracture-orthopedic surgery consulted, he will be taken to the OR this morning for operative repair.  Monitor postoperatively for pain control, DVT prophylaxis per Ortho. The patient is doing very well with orthopedic surgery. He will be okay to discharge to home with his daughter and her family tomorrow.   Active problems  Weakness/lack of mobility of the right upper extremity:Pt and family are complainig of changes in mobility to the pateint's right upper extremity following surgery. The patient describes and demonstrates rigidity and give away weakness of the limb. This appears to be centered around the patient's elbow. He also describes numbness and tingling to digits of right hand. CT of the C spine and MRI brain have been ordered and neurology has been consulted. X-rays of the shoulder, elbow, and wrist of the right upper extermity have been ordered. On 09/09/2024 tX-ray of the elbow demonstrates an effusion of the left elbow, but no evidence of fracture or dislocation. On the right elbow there is no evidence of arthropathy or other focal bone abnormality. The soft tissues were abnormality. The right shoulder demonstrates no evidence of fracture or dislocation. No evidence of arthropathy or other focal bone abnormality. The right wrist demonstrates diffusely decreased bone density. There is no evidence of fracture or dislocation.  MRI brain demonstrates no acute intracaranial abnormality with no evidence for intracranial metastatic disease. There is mild age-related cerebral atrophy with  chronic small vessel ischemic disease. There is also mucosal thickening with pneumotized secretions within the left phenoid sinus which could reflect changes of acute sinusitis.   Neurosurgery was contacted to review the films by neurology. They have stated that they do not seen anything that would require inhospital evaluation or treatment. They have recommended follow up with neurosurgery as outpatient.  Patient will require a referral to neurosurgery on Monday.  Orthopedic surgery PA Duwaine Large has placed the patient on steroids to improve his mobility.  PAF-hold Xarelto  preoperatively, resume once safe per Ortho surgery  CAD-status post DES to LAD 2009.  He has no chest pain, prior to his fall he was feeling at baseline.  Cardiology evaluated patient preoperatively on 9/11.  Chronic combined CHF-most recent 2D echo August 2025 showed LVEF 35-40%, global hypokinesis, mildly reduced RV, normal PA pressure, severe left atrial enlargement, moderate MR.  Currently appears euvolemic and compensated.  Apparently has not tolerated beta-blockers in the past due to fatigue, his GDMT with Entresto , spironolactone  is on hold perioperatively.  Resume postoperatively if blood pressure tolerates  Mild AKI-creatinine 1.3 on admission, baseline 1.  Returned to baseline this morning  Back pain-was scheduled to have lumbar kyphoplasty on the day of admission.  This is now on hold per orthopedic surgery  Scheduled Meds:  acetaminophen   1,000 mg Oral TID   Chlorhexidine  Gluconate Cloth  6 each Topical Daily   cholecalciferol   2,000 Units Oral QPM   dapagliflozin  propanediol  10 mg Oral QAC breakfast   enoxaparin  (LOVENOX ) injection  40 mg Subcutaneous Q24H   gabapentin   300 mg Oral TID   lactulose   20 g Oral BID   methocarbamol   500 mg Oral TID   mupirocin  ointment  1 Application Nasal BID   predniSONE   10 mg Oral 3 x daily with food   [START ON 09/10/2024] predniSONE   10 mg Oral 4X daily taper    predniSONE   20 mg Oral Nightly   simvastatin   40 mg Oral QHS   Continuous Infusions: PRN Meds:.diazepam , oxyCODONE , tamsulosin   Current Outpatient Medications  Medication Instructions   calcitonin, salmon, (MIACALCIN/FORTICAL) 200 UNIT/ACT nasal spray 1 spray, Daily   Cholecalciferol  (VITAMIN D3) 2000 UNITS TABS 2,000 Int'l Units, Every evening   dapagliflozin  propanediol (FARXIGA ) 10 mg, Oral, Daily before breakfast   methocarbamol  (ROBAXIN ) 500-1,000 mg, Oral, Every 6 hours PRN   rivaroxaban  (XARELTO ) 20 MG TABS tablet TAKE 1 TABLET BY MOUTH DAILY WITH SUPPER   sacubitril -valsartan  (ENTRESTO ) 97-103 MG 1 tablet, Oral, 2 times daily   simvastatin  (ZOCOR ) 40 mg, Oral, Daily at bedtime   spironolactone  (ALDACTONE ) 12.5 mg, Oral, Daily    Diet Orders (From admission, onward)     Start     Ordered   09/07/24 1753  Diet regular Room service appropriate? Yes; Fluid consistency: Thin  Diet effective now       Question Answer Comment  Room service appropriate? Yes   Fluid consistency: Thin      09/07/24 1752           DVT prophylaxis: enoxaparin  (LOVENOX ) injection 40 mg Start: 09/06/24 0800  Lab Results  Component Value Date   PLT 93 (L) 09/09/2024      Code Status: Full Code  Family Communication: no family at bedside   Status is: Inpatient Remains inpatient appropriate because: Surgery today  Level of  care: Med-Surg  Consultants:  Orthopedic surgery  Cardiology  Neurology  Objective: Vitals:   09/08/24 1918 09/09/24 0522 09/09/24 0842 09/09/24 1619  BP: 126/89 (!) 122/94 112/72 123/88  Pulse: 64 85 66 66  Resp: 19 16 17 18   Temp: 98.1 F (36.7 C) 98.3 F (36.8 C)  98.2 F (36.8 C)  TempSrc: Oral   Oral  SpO2: (!) 52% 100% 98% 99%  Weight:      Height:        Intake/Output Summary (Last 24 hours) at 09/09/2024 1702 Last data filed at 09/09/2024 0835 Gross per 24 hour  Intake 596 ml  Output 1175 ml  Net -579 ml   Wt Readings from Last 3 Encounters:   09/07/24 68 kg  09/03/24 66.8 kg  08/20/24 67.1 kg    Examination:  Exam:  Constitutional:  The patient is awake, alert, and oriented x 3. No acute distress. Eyes:  pupils and irises appear normal Normal lids and conjunctivae ENMT:  grossly normal hearing  Lips appear normal external ears, nose appear normal Oropharynx: mucosa, tongue,posterior pharynx appear normal Neck:  neck appears normal, no masses, normal ROM, supple no thyromegaly Respiratory:  No increased work of breathing. No wheezes, rales, or rhonchi No tactile fremitus Cardiovascular:  Regular rate and rhythm No murmurs, ectopy, or gallups. No lateral PMI. No thrills. Abdomen:  Abdomen is soft, non-tender, non-distended No hernias, masses, or organomegaly Normoactive bowel sounds.  Musculoskeletal:  No cyanosis, clubbing, or edema Skin:  No rashes, lesions, ulcers palpation of skin: no induration or nodules Neurologic:  CN 2-12 intact Patient is unable to extend right forearm at the elbow. He also has limited mobility at the shoulder. There are paresthesias to the digits of his right hand. He does maintain fine motor dexterity.  Psychiatric:  Mental status Mood, affect appropriate Orientation to person, place, time  judgment and insight appear intact    Data Reviewed: I have independently reviewed following labs and imaging studies   CBC Recent Labs  Lab 09/03/24 1444 09/06/24 1221 09/08/24 0327 09/09/24 0526  WBC 7.8 11.3* 9.7 12.4*  HGB 15.3 14.7 11.7* 11.7*  HCT 47.1 45.2 35.3* 35.7*  PLT 151 142* PLATELET CLUMPS NOTED ON SMEAR, UNABLE TO ESTIMATE 93*  MCV 102.6* 100.7* 101.1* 101.7*  MCH 33.3 32.7 33.5 33.3  MCHC 32.5 32.5 33.1 32.8  RDW 13.6 13.5 13.9 14.0  LYMPHSABS  --   --   --  0.9  MONOABS  --   --   --  0.5  EOSABS  --   --   --  0.0  BASOSABS  --   --   --  0.0    Recent Labs  Lab 09/03/24 1444 09/06/24 1221 09/08/24 0327 09/08/24 1645 09/09/24 0526  NA 144  --   139  --  141  K 4.6  --  4.2  --  5.5*  CL 107  --  106  --  109  CO2 26  --  23  --  26  GLUCOSE 114*  --  164*  --  149*  BUN 17  --  16  --  22  CREATININE 1.35* 1.01 1.03  --  1.16  CALCIUM 9.8  --  9.3  --  9.5  AST  --   --  30  --   --   ALT  --   --  18  --   --   ALKPHOS  --   --  50  --   --   BILITOT  --   --  1.2  --   --   ALBUMIN   --   --  3.4*  --   --   MG  --   --  1.8  --   --   CRP  --   --   --  7.0*  --     ------------------------------------------------------------------------------------------------------------------ No results for input(s): CHOL, HDL, LDLCALC, TRIG, CHOLHDL, LDLDIRECT in the last 72 hours.  No results found for: HGBA1C ------------------------------------------------------------------------------------------------------------------ No results for input(s): TSH, T4TOTAL, T3FREE, THYROIDAB in the last 72 hours.  Invalid input(s): FREET3  Cardiac Enzymes No results for input(s): CKMB, TROPONINI, MYOGLOBIN in the last 168 hours.  Invalid input(s): CK ------------------------------------------------------------------------------------------------------------------    Component Value Date/Time   BNP 449.4 (H) 09/05/2023 0529    CBG: Recent Labs  Lab 09/07/24 2140  GLUCAP 234*    Recent Results (from the past 240 hours)  Surgical pcr screen     Status: Abnormal   Collection Time: 09/03/24  3:24 PM   Specimen: Nasal Mucosa; Nasal Swab  Result Value Ref Range Status   MRSA, PCR NEGATIVE NEGATIVE Final   Staphylococcus aureus POSITIVE (A) NEGATIVE Final    Comment: (NOTE) The Xpert SA Assay (FDA approved for NASAL specimens in patients 38 years of age and older), is one component of a comprehensive surveillance program. It is not intended to diagnose infection nor to guide or monitor treatment. Performed at [facility], [address], [place], [place] [address]      Radiology  Studies: MR BRAIN W WO CONTRAST Result Date: 09/09/2024 CLINICAL DATA:  Initial evaluation for right upper extremity paresthesias. Metastatic disease evaluation. EXAM: MRI HEAD WITHOUT AND WITH CONTRAST TECHNIQUE: Multiplanar, multiecho pulse sequences of the brain and surrounding structures were obtained without and with intravenous contrast. CONTRAST:  7mL GADAVIST  GADOBUTROL  1 MMOL/ML IV SOLN COMPARISON:  None available. FINDINGS: Brain: Mild age-related cerebral atrophy. Patchy T2/FLAIR hyperintensity involving the periventricular deep white matter both cerebral hemispheres, consistent with chronic small vessel ischemic disease, mild for age. Patchy involvement of the pons noted. No evidence for acute or subacute infarct. Gray-white matter differentiation maintained. No areas of chronic cortical infarction. No acute or significant chronic intracranial blood products. No mass lesion, midline shift or mass effect. Ventricles normal size without hydrocephalus. No extra-axial fluid collection. Pituitary gland and suprasellar region within normal limits. No abnormal enhancement. Vascular: Major intracranial vascular flow voids are maintained. Skull and upper cervical spine: Degenerative osteoarthritic changes noted about the C1-2 articulations with thickening of the tectorial membrane. No significant stenosis at the craniocervical junction. Bone marrow signal intensity within normal limits. No scalp soft tissue abnormality. Sinuses/Orbits: Prior bilateral ocular lens replacement. Globes and orbital soft tissues demonstrate no acute finding. Mucosal thickening with pneumatized secretions seen within the left sphenoid sinus, which could reflect changes of acute sinusitis. Small right maxillary sinus retention cyst. Paranasal sinuses are otherwise largely clear. Trace right mastoid effusion noted, of doubtful significance. Other: None. IMPRESSION: 1. No acute intracranial abnormality. No evidence for intracranial  metastatic disease. 2. Mild age-related cerebral atrophy with chronic small vessel ischemic disease. 3. Mucosal thickening with pneumatized secretions within the left sphenoid sinus, which could reflect changes of acute sinusitis. Correlation with physical exam suggested. Electronically Signed   By: Morene Hoard M.D.   On: 09/09/2024 00:37   MR CERVICAL SPINE W WO CONTRAST Result Date: 09/09/2024 CLINICAL DATA:  Initial evaluation  for acute myelopathy, metastatic disease evaluation. EXAM: MRI CERVICAL SPINE WITHOUT AND WITH CONTRAST TECHNIQUE: Multiplanar and multiecho pulse sequences of the cervical spine, to include the craniocervical junction and cervicothoracic junction, were obtained without and with intravenous contrast. CONTRAST:  7mL GADAVIST  GADOBUTROL  1 MMOL/ML IV SOLN COMPARISON:  None Available. FINDINGS: Alignment: Straightening with mild reversal of the normal cervical lordosis. Trace facet mediated anterolisthesis of C4 on C5 and C7 on T1. Vertebrae: Mild chronic compression deformity noted involving the superior endplate of T2 and likely T3 without retropulsion. Vertebral body height otherwise maintained. Bone marrow signal intensity within normal limits. No worrisome osseous lesions or evidence for metastatic disease. Mild degenerative reactive endplate changes present about the C5-6 and C6-7 interspaces. Degenerative osteoarthritic changes noted about the C1-2 articulations. No other abnormal marrow edema or enhancement. Cord: Normal signal morphology.  No abnormal enhancement. Posterior Fossa, vertebral arteries, paraspinal tissues: Patchy signal abnormality noted within the pons, most characteristic of chronic microvascular ischemic disease. Craniocervical junction within normal limits. Paraspinous soft tissues within normal limits. Normal flow voids seen within the vertebral arteries bilaterally. Disc levels: C2-C3: Small central disc protrusion minimally indents the ventral thecal  sac. Moderate right facet hypertrophy. No stenosis. C3-C4: Degenerative vertebral disc space narrowing with diffuse disc bulge and bilateral uncovertebral spurring. Moderate left with mild right facet hypertrophy. No spinal stenosis. Moderate left C4 foraminal narrowing. Right neural foramina remains patent. C4-C5: Degenerative intervertebral disc space narrowing with diffuse disc bulge and bilateral uncovertebral spurring. Moderate left with mild right facet hypertrophy. No significant spinal stenosis. Severe left with moderate right C5 foraminal narrowing. C5-C6: Degenerative intervertebral disc space narrowing with diffuse disc osteophyte complex. Posterior component flattens and partially effaces the ventral thecal sac. Superimposed mild facet hypertrophy. Resultant mild spinal stenosis with moderate bilateral C6 foraminal narrowing. C6-C7: Degenerative disc space narrowing with diffuse disc osteophyte complex. Flattening and partial effacement of the ventral thecal sac. Superimposed mild bilateral facet hypertrophy. Resultant mild spinal stenosis. Severe right C7 foraminal narrowing. Left neural foramen remains patent. C7-T1: Minimal disc bulge. Moderate right with mild left facet arthrosis. No spinal stenosis. Foramina remain patent. IMPRESSION: 1. No evidence for metastatic disease within the cervical spine. Normal MRI appearance of the cervical spinal cord. 2. Multilevel cervical spondylosis with resultant mild spinal stenosis at C5-6 and C6-7. 3. Multifactorial degenerative changes with resultant multilevel foraminal narrowing as above. Notable findings include moderate left C4 foraminal stenosis, severe left with moderate right C5 foraminal narrowing, moderate bilateral C6 foraminal stenosis, with severe right C7 foraminal narrowing. Electronically Signed   By: Morene Hoard M.D.   On: 09/09/2024 00:10   Kayzlee Wirtanen, DO [facility] 09/09/2024 5:12 PM  Between 7 am - 7 pm I am available,  please contact me via Amion (for emergencies) or Securechat (non urgent messages)  Between 7 pm - 7 am I am not available, please contact night coverage MD/APP via Amion

## 2024-09-09 NOTE — Plan of Care (Signed)
  Problem: Clinical Measurements: Goal: Ability to maintain clinical measurements within normal limits will improve Outcome: Progressing   Problem: Clinical Measurements: Goal: Will remain free from infection Outcome: Progressing   Problem: Clinical Measurements: Goal: Cardiovascular complication will be avoided Outcome: Progressing   

## 2024-09-09 NOTE — Progress Notes (Signed)
  Progress Note  Patient Name: [name] Date of Encounter: 09/09/2024 [facility] Cardiologist: Peter Swaz[place], MD   Interval Summary   No chest pain or heart failure symptoms.  Euvolemic Daughter and son in law at bedside No cardiac complications - POD 2 OAC has not be restarted.  Complains of RUE mobility s/p surgery - primary team following.   Vital Signs Vitals:   09/08/24 1918 09/09/24 0522 09/09/24 0842 09/09/24 1619  BP: 126/89 (!) 122/94 112/72 123/88  Pulse: 64 85 66 66  Resp: 19 16 17 18   Temp: 98.1 F (36.7 C) 98.3 F (36.8 C)  98.2 F (36.8 C)  TempSrc: Oral   Oral  SpO2: (!) 52% 100% 98% 99%  Weight:      Height:        Intake/Output Summary (Last 24 hours) at 09/09/2024 1848 Last data filed at 09/09/2024 1700 Gross per 24 hour  Intake 596 ml  Output 1176 ml  Net -580 ml      09/07/2024   10:56 AM 09/06/2024    6:09 AM 09/03/2024    1:43 PM  Last 3 Weights  Weight (lbs) 150 lb 148 lb 147 lb 3.2 oz  Weight (kg) 68.04 kg 67.132 kg 66.769 kg      Telemetry/ECG  Not on telemetry  - Personally Reviewed  Physical Exam  GEN: No acute distress.  Age-appropriate.  Hemodynamically stable Neck: No JVD Cardiac: Irregularly irregular, no murmurs, rubs, or gallops.  Respiratory: Clear to auscultation bilaterally. GI: Soft, nontender, non-distended  MS: No edema  Assessment & Plan  CAD: Status post PCI to the LAD 2009 Recently underwent cardiac PET/CT followed by left and right heart catheterization-no coronary interventions performed in November 2024. No postoperative cardiovascular complications. Continue Zocor  40 mg p.o. daily Not on antiplatelets as he is on anticoagulation outpatient for A-fib  Chronic Heart failure with reduced EF: Compensated. Euvolemic. Echo August 2025: LVEF 35-40%, see report for additional details Currently on Farxiga  10 mg p.o. daily Restart spironolactone  12.5 mg p.o. daily, tomorrow if blood pressures are  stable.  Hold if SBP <110 mmHg At home is on Entresto  97/103 mg p.o. twice daily, currently held as blood pressures are stable.  Restart when clinically appropriate. Has not done well with beta-blockers in the past due to fatigue. Will need close outpatient follow-up to uptitrate GDMT.  Still needs to be scheduled -reach out to cardiology closer to discharge to arrange care  Persistent atrial fibrillation: Based on auscultation regular. Not on AV nodal blocking agents. Restart oral anticoagulation when cleared by surgery High CHA2DS2-VASc SCORE  Care discussed with patient, daughter, son-in-law at bedside.   [facility] will sign off.   The patient is ready for discharge   from a cardiac standpoint. Medication Recommendations: See above, restart home medications if blood pressure tolerates. Follow up as an outpatient:  Reach out to cardiology closer to discharge to arrange outpatient follow-up For questions or updates, please contact [facility] Please consult [email or web address] for contact info under   Signed, Hartford Maulden, DO

## 2024-09-09 NOTE — Progress Notes (Signed)
 Brief Neuro Note:  MRI Brain neg for stroke. MR C spine with multilevel neural foraminal stenosis. Recommend Pt and OT and outpatient EMG/NCS to look for radiculpathy and follow up with ortho spine.  No need for inpatient neurologic evaluation. Plan dicussed with Dr. Soledad with the Hospitalist team over secure chat.  [name] [facility]

## 2024-09-09 NOTE — Progress Notes (Signed)
 Subjective: Patient reports pain in the hip as minimal.  Tolerating diet. No CP, SOB.  Has mobilized OOB with PT/OT and did fairly well.  Mainly worried about right arm weakness and numbness in tips of fingers of right side. States this started when he woke up from surgery. Neurology signed off as MRI brain negative for stroke. He states he still has weakness today, no changes in symptoms. This mainly occurs when extending at elbow, He feels he has no control in doing so.   Objective:   VITALS:   Vitals:   09/08/24 0817 09/08/24 1918 09/09/24 0522 09/09/24 0842  BP: 115/63 126/89 (!) 122/94 112/72  Pulse: 91 64 85 66  Resp: 15 19 16 17   Temp: 97.7 F (36.5 C) 98.1 F (36.7 C) 98.3 F (36.8 C)   TempSrc: Oral Oral    SpO2: 98% (!) 52% 100% 98%  Weight:      Height:          Latest Ref Rng & Units 09/09/2024    5:26 AM 09/08/2024    3:27 AM 09/06/2024   12:21 PM  CBC  WBC 4.0 - 10.5 K/uL 12.4  9.7  11.3   Hemoglobin 13.0 - 17.0 g/dL 88.2  88.2  85.2   Hematocrit 39.0 - 52.0 % 35.7  35.3  45.2   Platelets 150 - 400 K/uL 93  PLATELET CLUMPS NOTED ON SMEAR, UNABLE TO ESTIMATE  142       Latest Ref Rng & Units 09/09/2024    5:26 AM 09/08/2024    3:27 AM 09/06/2024   12:21 PM  BMP  Glucose 70 - 99 mg/dL 850  835    BUN 8 - 23 mg/dL 22  16    Creatinine 9.38 - 1.24 mg/dL 8.83  8.96  8.98   Sodium 135 - 145 mmol/L 141  139    Potassium 3.5 - 5.1 mmol/L 5.5  4.2    Chloride 98 - 111 mmol/L 109  106    CO2 22 - 32 mmol/L 26  23    Calcium 8.9 - 10.3 mg/dL 9.5  9.3     Intake/Output      09/13 0701 09/14 0700 09/14 0701 09/15 0700   P.O. 820 236   I.V. (mL/kg)     IV Piggyback     Total Intake(mL/kg) 820 (12.1) 236 (3.5)   Urine (mL/kg/hr) 1375 (0.8) 400 (1.3)   Stool 0    Blood     Total Output 1375 400   Net -555 -164        Stool Occurrence 0 x       Physical Exam: General: NAD.  Laying supine in bed, calm, comfortable, talkative Resp: No increased  wob Cardio: regular rate and rhythm ABD soft Neurologically intact MSK Neurovascularly intact Sensation intact distally Intact pulses distally Dorsiflexion/Plantar flexion intact Incision: dressing C/D/I  R shoulder - non-TTP, full ROM in all directions, 5/5 strength, negative cuff tests R elbow - non-TTP, full ROM, 5/5 strength, negative Tinel's test, patient unable to control active motion from 90-0 degrees, arm immediately drops onto bed R wrist - non-TTP, full ROM, 5/5 strength, negative Tinel's test, negative Phalen's test R hand - non-TTP, full ROM, equal grip strength, no edema or erythema, patient reporting decreased sensation and tingling in distal phalanges 1-5 on palmar side only from DIP joints through finger tufts RUE intention tremor present when reaching for any objects and trying fine motor movements like unscrewing a  cap  C-spine non-TTP, full ROM in all directions  Assessment: 2 Days Post-Op  S/P Procedure(s) (LRB): ARTHROPLASTY, HIP, TOTAL, ANTERIOR APPROACH, LEFT (N/A) by Dr. Kendal on 09/07/24  Principal Problem:   Closed left hip fracture [facility]) Active Problems:   Preoperative clearance   Plan: MRI Brain negative for stroke. 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 Patient and daughter very interested in getting an outpatient nerve conduction study set up prior to discharge from hospital.   Prednisone  pack ordered yesterday, will continue. Discussed with family that this may not give immediate change in symptoms and may take time.   Advance diet Up with therapy Incentive Spirometry Elevate and Apply ice  Weightbearing: WBAT LLE Insicional and dressing care: Dressings left intact until follow-up and Reinforce dressings as needed Orthopedic device(s): None Showering: Keep dressing dry VTE prophylaxis: Lovenox  40mg  qd, SCDs, ambulation Pain control: PRN Follow - up plan: 2 weeks postop in  office with Dr. Kendal  Dispo: PT recommending HHPT.      Army MARLA Daring, PA-C Office [phone] 09/09/2024, 11:34 AM

## 2024-09-10 ENCOUNTER — Encounter (HOSPITAL_COMMUNITY): Payer: Self-pay | Admitting: Student

## 2024-09-10 DIAGNOSIS — S72002D Fracture of unspecified part of neck of left femur, subsequent encounter for closed fracture with routine healing: Secondary | ICD-10-CM | POA: Diagnosis not present

## 2024-09-10 LAB — CBC WITH DIFFERENTIAL/PLATELET
Abs Immature Granulocytes: 0.12 K/uL — ABNORMAL HIGH (ref 0.00–0.07)
Basophils Absolute: 0 K/uL (ref 0.0–0.1)
Basophils Relative: 0 %
Eosinophils Absolute: 0 K/uL (ref 0.0–0.5)
Eosinophils Relative: 0 %
HCT: 33.9 % — ABNORMAL LOW (ref 39.0–52.0)
Hemoglobin: 11.3 g/dL — ABNORMAL LOW (ref 13.0–17.0)
Immature Granulocytes: 1 %
Lymphocytes Relative: 7 %
Lymphs Abs: 1 K/uL (ref 0.7–4.0)
MCH: 33.8 pg (ref 26.0–34.0)
MCHC: 33.3 g/dL (ref 30.0–36.0)
MCV: 101.5 fL — ABNORMAL HIGH (ref 80.0–100.0)
Monocytes Absolute: 0.7 K/uL (ref 0.1–1.0)
Monocytes Relative: 5 %
Neutro Abs: 12.8 K/uL — ABNORMAL HIGH (ref 1.7–7.7)
Neutrophils Relative %: 87 %
Platelets: 85 K/uL — ABNORMAL LOW (ref 150–400)
RBC: 3.34 MIL/uL — ABNORMAL LOW (ref 4.22–5.81)
RDW: 14 % (ref 11.5–15.5)
WBC: 14.6 K/uL — ABNORMAL HIGH (ref 4.0–10.5)
nRBC: 0 % (ref 0.0–0.2)

## 2024-09-10 LAB — BASIC METABOLIC PANEL WITH GFR
Anion gap: 8 (ref 5–15)
BUN: 23 mg/dL (ref 8–23)
CO2: 26 mmol/L (ref 22–32)
Calcium: 9.2 mg/dL (ref 8.9–10.3)
Chloride: 106 mmol/L (ref 98–111)
Creatinine, Ser: 1.02 mg/dL (ref 0.61–1.24)
GFR, Estimated: >60 mL/min (ref >60)
Glucose, Bld: 138 mg/dL — ABNORMAL HIGH (ref 70–99)
Potassium: 4.5 mmol/L (ref 3.5–5.1)
Sodium: 140 mmol/L (ref 135–145)

## 2024-09-10 LAB — VITAMIN D 25 HYDROXY (VIT D DEFICIENCY, FRACTURES): Vit D, 25-Hydroxy: 60.56 ng/mL (ref 30–100)

## 2024-09-10 MED ORDER — SENNOSIDES-DOCUSATE SODIUM 8.6-50 MG PO TABS
1.0000 | ORAL_TABLET | Freq: Every day | ORAL | 0 refills | Status: AC
Start: 2024-09-10 — End: 2024-10-10

## 2024-09-10 MED ORDER — VITAMIN B-12 1000 MCG PO TABS
1000.0000 ug | ORAL_TABLET | Freq: Every day | ORAL | 0 refills | Status: AC
Start: 2024-09-10 — End: 2024-10-10

## 2024-09-10 MED ORDER — GABAPENTIN 300 MG PO CAPS: 300.0000 mg | ORAL_CAPSULE | Freq: Three times a day (TID) | ORAL | 0 refills | Status: DC

## 2024-09-10 MED ORDER — POLYETHYLENE GLYCOL 3350 17 G PO PACK: 17.0000 g | PACK | Freq: Every day | ORAL | 0 refills | Status: AC | PRN

## 2024-09-10 MED ORDER — ACETAMINOPHEN 500 MG PO TABS: 1000.0000 mg | ORAL_TABLET | Freq: Three times a day (TID) | ORAL | 0 refills | Status: DC | PRN

## 2024-09-10 MED ORDER — OXYCODONE HCL 5 MG PO TABS: 5.0000 mg | ORAL_TABLET | ORAL | 0 refills | Status: DC | PRN

## 2024-09-10 MED ORDER — PREDNISONE 10 MG PO TABS: ORAL_TABLET | ORAL | 0 refills | Status: DC

## 2024-09-10 MED ORDER — RIVAROXABAN 20 MG PO TABS
20.0000 mg | ORAL_TABLET | Freq: Every day | ORAL | Status: DC
Start: 2024-09-10 — End: 2024-09-10
  Administered 2024-09-10: 20 mg via ORAL
  Filled 2024-09-10: qty 1

## 2024-09-10 NOTE — Anesthesia Postprocedure Evaluation (Signed)
 Anesthesia Post Note  Patient: [name]  Procedure(s) Performed: ARTHROPLASTY, HIP, TOTAL, ANTERIOR APPROACH, LEFT (Hip)     Patient location during evaluation: PACU Anesthesia Type: Spinal Level of consciousness: oriented and awake and alert Pain management: pain level controlled Vital Signs Assessment: post-procedure vital signs reviewed and stable Respiratory status: spontaneous breathing, respiratory function stable and patient connected to nasal cannula oxygen Cardiovascular status: blood pressure returned to baseline and stable Postop Assessment: no headache, no backache, no apparent nausea or vomiting, spinal receding and patient able to bend at knees Anesthetic complications: no   No notable events documented.               Aceson Labell,W. EDMOND

## 2024-09-10 NOTE — Care Management Important Message (Signed)
 Important Message  Patient Details  Name: [name] MRN: [id number] Date of Birth: 02-Mar-1933   Important Message Given:  Yes - Medicare IM     Jon Cruel 09/10/2024, 12:09 PM

## 2024-09-10 NOTE — Plan of Care (Signed)
  Problem: Clinical Measurements: Goal: Ability to maintain clinical measurements within normal limits will improve Outcome: Progressing   Problem: Clinical Measurements: Goal: Will remain free from infection Outcome: Progressing   Problem: Clinical Measurements: Goal: Cardiovascular complication will be avoided Outcome: Progressing   Problem: Activity: Goal: Risk for activity intolerance will decrease Outcome: Progressing   Problem: Nutrition: Goal: Adequate nutrition will be maintained Outcome: Progressing   Problem: Elimination: Goal: Will not experience complications related to bowel motility Outcome: Progressing

## 2024-09-10 NOTE — Discharge Summary (Signed)
 Physician Discharge Summary  [name] FMW:[id number] DOB: Oct 07, 1933 DOA: 09/06/2024  PCP: [name], MD  Admit date: 09/06/2024 Discharge date: 09/10/2024  Admitted from: Home Discharge disposition: Home with home health PT  Recommendations at discharge:  Cautious use of pain medicines Continue bowel regimen with scheduled and as needed meds Continue tapering course of prednisone  as started by orthopedics for cervical nerve compression. Continue vitamin B12 supplement Continue Xarelto  and other heart medicines as before Follow-up with orthopedics Dr. Kendal as an outpatient Follow-up with ortho spine Dr. Burnetta as an outpatient  Brief narrative: [name] is a 89 y.o. male with PMH significant for HTN, HLD, CAD/LAD stent, paroxysmal A-fib, diastolic CHF, prostate cancer with cryotherapy in 2009, chronic back pain was scheduled to have a kyphoplasty as an outpatient 9/11, patient was actually in the preop area hospital in preparation of kyphoplasty.  He tripped and fell, landed on his left hip and had immediate pain.  Procedure was canceled and he was sent to ED for further evaluation.  X-ray showed a left hip fracture Orthopedics recommended surgical intervention Admitted to [facility] Underwent cardiology evaluation prior to intervention 9/12, underwent left total hip arthroplasty  Subjective: Patient was seen and examined this morning. Sitting up in recliner.  Not in distress.  Daughter at bedside. We had a long conversation about Chart reviewed. Hemodynamically stable, breathing on room air Labs from this morning showed WBC count 14.6, hemoglobin 11.3, platelets 85, renal function and blood pressure normal  Hospital course: Left hip fracture S/p THA -9/12 Dr. Kendal Secondary to fall Imaging and procedure as above Seen by PT.  Home with PT recommended. Pain regimen --- Scheduled: Tylenol  1 g 3 times daily Neurontin  300 mg 3 times daily, Robaxin  500 mg 3 times  daily --- PRN: Oxycodone  5 mg every 4 hours Bowel regimen: Scheduled and as needed bowel regimen DVT prophylaxis: Xarelto    Weakness/lack of mobility of the right upper extremity: Pt and family complained of changes in RUE mobility, tingling and numbness of right hand digits.  Imagings obtained.   Neurology was consulted Right elbow x-ray showed effusion, no fracture or dislocation Right shoulder x-ray unremarkable Right wrist showed diffusely decreased bone density without evidence of fracture or dislocation.  MRI brain did not show any acute intracranial abnormality, showed age-related cerebral atrophy, chronic small vessel ischemic disease. MRI cervical spine showed multilevel cervical spondylosis with moderate to severe foraminal stenosis on C4-C7 level. Per previous documentation, neurology discussed the imaging with neurosurgery, did not recommend any need of inpatient evaluation or intervention.  Recommended outpatient follow-up. 9/15, I spoke to orthospine Dr. Burnetta and discussed about the cervical MRI findings.  Patient was already established with him for kyphoplasty.  Patient will follow-up with him in the office for potential need of cervical vertebrae injection. Orthopedic surgery started the patient on steroids to improve his mobility.  Continue tapering course of prednisone  Home with PT recommended by PT.  Paroxysmal A-fib PTA meds-not on any AV nodal blocking agent. Chronically anticoagulated with Xarelto .  Resumed today after discussing with orthopedics.  CAD s/p LAD stent 2009 HLD no anginal symptoms Was seen by cardiology prior to surgery Continue Xarelto  and simvastatin   Chronic combined CHF HTN most recent 2D echo August 2025 showed LVEF 35-40%, global hypokinesis, mildly reduced RV, normal PA pressure, severe left atrial enlargement, moderate MR.   Currently appears euvolemic and compensated.   Per report, he has not tolerated beta-blockers in the past due to  fatigue,  PTA meds- GDMT with Entresto  97-103 mg twice daily, spironolactone  12.5 mg daily, Farxiga  10 mg daily. Continue the same  Mild AKI creatinine 1.3 on admission, baseline 1.  Returned to baseline this morning Recent Labs    09/23/23 0924 10/26/23 0938 12/01/23 1120 01/02/24 1102 02/02/24 1040 05/22/24 1104 09/03/24 1444 09/06/24 1221 09/08/24 0327 09/09/24 0526 09/10/24 0214  BUN 15 14 14 13 18 15 17   --  16 22 23   CREATININE 0.99 0.92 1.00 0.96 1.02 0.95 1.35* 1.01 1.03 1.16 1.02   Chronic macrocytic anemia Vitamin B12 deficiency Mild drop in hemoglobin postop noted. Continue Vitamin B12 supplement Recent Labs    09/03/24 1444 09/06/24 1221 09/08/24 0327 09/08/24 1645 09/09/24 0526 09/10/24 0214  HGB 15.3 14.7 11.7*  --  11.7* 11.3*  MCV 102.6* 100.7* 101.1*  --  101.7* 101.5*  VITAMINB12  --   --   --  166*  --   --     Thrombocytopenia Noted drop in platelet count postop. Currently on Lovenox .  I would like to stop any heparin  product.  Resume Xarelto  Recent Labs  Lab 09/03/24 1444 09/06/24 1221 09/08/24 0327 09/09/24 0526 09/10/24 0214  PLT 151 142* PLATELET CLUMPS NOTED ON SMEAR, UNABLE TO ESTIMATE 93* 85*     Back pain- was scheduled to have lumbar kyphoplasty on the day of admission.  This is now on hold per orthopedic surgery   PT Follow up Rec: Home Health Pt9/15/2025 0839   Goals of care   Code Status: Full Code   Diet:  Diet Order             Diet general           Diet regular Room service appropriate? Yes; Fluid consistency: Thin  Diet effective now                   Nutritional status:  Body mass index is 23.49 kg/m.       Wounds:  - Wound 09/07/24 1408 Surgical Closed Surgical Incision Leg Left (Active)  Date First Assessed/Time First Assessed: 09/07/24 1408   Primary Wound Type: Surgical  Secondary Wound Type - Surgical: Closed Surgical Incision  Location: Leg  Location Orientation: Left    Assessments  09/07/2024  2:45 PM 09/09/2024  9:00 PM  Site / Wound Assessment Dressing in place / Unable to assess Dressing in place / Unable to assess  Drainage Amount None None  Dressing Type Silver hydrofiber Silver hydrofiber  Dressing Changed New --  Dressing Status Clean, Dry, Intact Clean, Dry, Intact  Margins Attached edges (approximated) --     No associated orders.    Discharge Exam:   Vitals:   09/09/24 1619 09/09/24 2050 09/10/24 0411 09/10/24 0914  BP: 123/88 107/76 108/72 130/85  Pulse: 66 80 85   Resp: 18 18 17 17   Temp: 98.2 F (36.8 C) 98.2 F (36.8 C) 97.8 F (36.6 C) 97.7 F (36.5 C)  TempSrc: Oral Oral Oral   SpO2: 99% 97% 98% 99%  Weight:      Height:        Body mass index is 23.49 kg/m.  General exam: Pleasant, elderly Caucasian male.  Not in distress Skin: No rashes, lesions or ulcers. HEENT: Atraumatic, normocephalic, no obvious bleeding Lungs: Clear to auscultation bilaterally,  CVS: S1, S2, no murmur,   GI/Abd: Soft, nontender, nondistended, bowel sound present,   CNS: Alert, awake, oriented x 3.  Right arm weakness persists Psychiatry: Mood appropriate Extremities:  No pedal edema, no calf tenderness,   Follow ups:    Follow-up Information     Haddix, Franky SQUIBB, MD. Go on 09/25/2024.   Specialty: Orthopedic Surgery Why: 09/25/24 at 10AM for wound check and repeat x-rays Contact information: [address] [place] [address] [phone]         [place], [facility] Follow up.   Specialty: Home Health Services Contact information: [address] [place] [address] [place] [address] [phone]         [name], MD Follow up.   Specialty: Family Medicine Contact information: [address] [place] [address] [phone]         Burnetta Aures, MD Follow up.   Specialty: Orthopedic Surgery Contact information: [address] [place] [address] [place] [address] [phone]                  Discharge Instructions:   Discharge Instructions     Ambulatory referral to Neurosurgery   Complete by: As directed    Please Select To Department: [facility] for Nerve or Spine  Please select To Department: [facility] AT [place] for Cranial or Neurovascular   Clinical Indicator: Spine   Patient with new onset right upper extremity mobility issues immediately following hip surgery. There is an abnormal MRI of the C-spine.   Call MD for:  difficulty breathing, headache or visual disturbances   Complete by: As directed    Call MD for:  extreme fatigue   Complete by: As directed    Call MD for:  hives   Complete by: As directed    Call MD for:  persistant dizziness or light-headedness   Complete by: As directed    Call MD for:  persistant nausea and vomiting   Complete by: As directed    Call MD for:  severe uncontrolled pain   Complete by: As directed    Call MD for:  temperature >100.4   Complete by: As directed    Diet general   Complete by: As directed    Discharge instructions   Complete by: As directed    Recommendations at discharge:   Cautious use of pain medicines  Continue bowel regimen with scheduled and as needed meds  Continue tapering course of prednisone  as started by orthopedics for cervical nerve compression.  Continue vitamin B12 supplement  Continue Xarelto  and other heart medicines as before  Follow-up with orthopedics Dr. Kendal as an outpatient  Follow-up with ortho spine Dr. Burnetta as an outpatient  PDMP reviewed this encounter.   Opioid taper instructions: It is important to wean off of your opioid medication as soon as possible. If you do not need pain medication after your surgery it is ok to stop day one. Opioids include: Codeine, Hydrocodone(Norco, Vicodin), Oxycodone (Percocet, oxycontin ) and hydromorphone amongst others.  Long term and even short term use of opiods can cause: Increased pain  response Dependence Constipation Depression Respiratory depression And more.  Withdrawal symptoms can include Flu like symptoms Nausea, vomiting And more Techniques to manage these symptoms Hydrate well Eat regular healthy meals Stay active Use relaxation techniques(deep breathing, meditating, yoga) Do Not substitute Alcohol to help with tapering If you have been on opioids for less than two weeks and do not have pain than it is ok to stop all together.  Plan to wean off of opioids This plan should start within one week post op of your joint replacement. Maintain the same interval or  time between taking each dose and first decrease the dose.  Cut the total daily intake of opioids by one tablet each day Next start to increase the time between doses. The last dose that should be eliminated is the evening dose.        General discharge instructions: Follow with Primary MD [name], MD in 7 days  Please request your PCP  to go over your hospital tests, procedures, radiology results at the follow up. Please get your medicines reviewed and adjusted.  Your PCP may decide to repeat certain labs or tests as needed. Do not drive, operate heavy machinery, perform activities at heights, swimming or participation in water  activities or provide baby sitting services if your were admitted for syncope or siezures until you have seen by Primary MD or a Neurologist and advised to do so again. [place]  Controlled Substance Reporting System database was reviewed. Do not drive, operate heavy machinery, perform activities at heights, swim, participate in water  activities or provide baby-sitting services while on medications for pain, sleep and mood until your outpatient physician has reevaluated you and advised to do so again.  You are strongly recommended to comply with the dose, frequency and duration of prescribed medications. Activity: As tolerated with Full fall precautions use walker/cane  & assistance as needed Avoid using any recreational substances like cigarette, tobacco, alcohol, or non-prescribed drug. If you experience worsening of your admission symptoms, develop shortness of breath, life threatening emergency, suicidal or homicidal thoughts you must seek medical attention immediately by calling 911 or calling your MD immediately  if symptoms less severe. You must read complete instructions/literature along with all the possible adverse reactions/side effects for all the medicines you take and that have been prescribed to you. Take any new medicine only after you have completely understood and accepted all the possible adverse reactions/side effects.  Wear Seat belts while driving. You were cared for by a hospitalist during your hospital stay. If you have any questions about your discharge medications or the care you received while you were in the hospital after you are discharged, you can call the unit and ask to speak with the hospitalist or the covering physician. Once you are discharged, your primary care physician will handle any further medical issues. Please note that NO REFILLS for any discharge medications will be authorized once you are discharged, as it is imperative that you return to your primary care physician (or establish a relationship with a primary care physician if you do not have one).   Discharge wound care:   Complete by: As directed    Increase activity slowly   Complete by: As directed        Discharge Medications:   Allergies as of 09/10/2024   No Known Allergies      Medication List     TAKE these medications    acetaminophen  500 MG tablet Commonly known as: TYLENOL  Take 2 tablets (1,000 mg total) by mouth 3 (three) times daily as needed for mild pain (pain score 1-3), fever or headache.   calcitonin (salmon) 200 UNIT/ACT nasal spray Commonly known as: MIACALCIN/FORTICAL Place 1 spray into alternate nostrils daily in the afternoon.    cyanocobalamin 1000 MCG tablet Commonly known as: VITAMIN B12 Take 1 tablet (1,000 mcg total) by mouth daily.   dapagliflozin  propanediol 10 MG Tabs tablet Commonly known as: Farxiga  Take 1 tablet (10 mg total) by mouth daily before breakfast.   gabapentin  300 MG capsule Commonly known as: NEURONTIN   Take 1 capsule (300 mg total) by mouth 3 (three) times daily.   methocarbamol  500 MG tablet Commonly known as: ROBAXIN  Take 1-2 tablets (500-1,000 mg total) by mouth every 6 (six) hours as needed.   oxyCODONE  5 MG immediate release tablet Commonly known as: Oxy IR/ROXICODONE  Take 1 tablet (5 mg total) by mouth every 4 (four) hours as needed for severe pain (pain score 7-10).   polyethylene glycol 17 g packet Commonly known as: MiraLax  Take 17 g by mouth daily as needed for severe constipation.   predniSONE  10 MG tablet Commonly known as: DELTASONE  Take 4 tablets daily X 2 days, then, Take 3 tablets daily X 2 days, then, Take 2 tablets daily X 2 days, then, Take 1 tablets daily X 1 day.   rivaroxaban  20 MG Tabs tablet Commonly known as: Xarelto  TAKE 1 TABLET BY MOUTH DAILY WITH SUPPER   sacubitril -valsartan  97-103 MG Commonly known as: ENTRESTO  Take 1 tablet by mouth 2 (two) times daily.   senna-docusate 8.6-50 MG tablet Commonly known as: Senokot-S Take 1 tablet by mouth daily.   simvastatin  40 MG tablet Commonly known as: ZOCOR  TAKE 1 TABLET BY MOUTH AT BEDTIME   spironolactone  25 MG tablet Commonly known as: ALDACTONE  Take 0.5 tablets (12.5 mg total) by mouth daily.   Vitamin D3 50 MCG (2000 UT) Tabs Take 2,000 Int'l Units by mouth every evening.               Durable Medical Equipment  (From admission, onward)           Start     Ordered   09/10/24 0901  For home use only DME Walker rolling  Once       Comments: S/p ARTHROPLASTY, HIP, TOTAL, ANTERIOR APPROACH, LEFT  Question Answer Comment  Walker: With 5 Inch Wheels   Patient needs a walker to  treat with the following condition Weakness      09/10/24 0900   09/10/24 0901  For home use only DME 3 n 1  Once       Comments: ARTHROPLASTY, HIP, TOTAL, ANTERIOR APPROACH, LEFT (   09/10/24 0900              Discharge Care Instructions  (From admission, onward)           Start     Ordered   09/10/24 0000  Discharge wound care:        09/10/24 1423             The results of significant diagnostics from this hospitalization (including imaging, microbiology, ancillary and laboratory) are listed below for reference.    Procedures and Diagnostic Studies:   DG HIP UNILAT W OR W/O PELVIS 2-3 VIEWS LEFT Result Date: 09/07/2024 CLINICAL DATA:  Postop hip fracture EXAM: DG HIP (WITH OR WITHOUT PELVIS) 2-3V LEFT COMPARISON:  09/06/2024 FINDINGS: SI joints are non widened. Interval left hip replacement with intact hardware and normal alignment. Gas in the soft tissues consistent with recent surgery. Vascular calcifications. Prostate clips. IMPRESSION: Status post left hip replacement with expected postsurgical change. Electronically Signed   By: Luke Bun M.D.   On: 09/07/2024 15:43   DG HIP UNILAT WITH PELVIS 2-3 VIEWS LEFT Result Date: 09/07/2024 CLINICAL DATA:  Left hip arthroplasty intraoperative images EXAM: OPERATIVE left HIP (WITH PELVIS IF PERFORMED) 7 VIEWS TECHNIQUE: Fluoroscopic spot image(s) were submitted for interpretation post-operatively. COMPARISON:  September 06, 2021 FINDINGS: Fluoroscopy time: 40 seconds.  Total dose: 3.2 mGy Intraoperative  fluoroscopic images performed during left total hip arthroplasty procedure. The arthroplasty components are well seated and articulate appropriately. IMPRESSION: Intraoperative fluoroscopic images for left total hip arthroplasty. Please see operative notes for further details. Electronically Signed   By: Michaeline Blanch M.D.   On: 09/07/2024 15:18   DG C-Arm 1-60 Min-No Report Result Date: 09/07/2024 Fluoroscopy was utilized  by the requesting physician.  No radiographic interpretation.   DG C-Arm 1-60 Min-No Report Result Date: 09/07/2024 Fluoroscopy was utilized by the requesting physician.  No radiographic interpretation.   X-ray pelvis complete Result Date: 09/06/2024 EXAM: 1 or 2 VIEW(S) XRAY OF THE PELVIS 09/06/2024 06:36:00 AM COMPARISON: None available. CLINICAL HISTORY: Fall 190176. Reason for exam: fall; Per progress notes: Patient upon entering department had a fall and landed on L hip. Daughter and Jay, [place] walking with patient. Dr. Donaciano Sprang notified @ 0600 and order for pelvic xray received. FINDINGS: BONES AND JOINTS: Acute fracture of left femoral neck with proximal migration of distal femur. SOFT TISSUES: Vascular calcifications. IMPRESSION: 1. Acute fracture of left femoral neck with proximal migration of distal femur. Electronically signed by: Waddell Calk MD 09/06/2024 07:01 AM EDT RP Workstation: [id number]     Labs:   Basic Metabolic Panel: Recent Labs  Lab 09/03/24 1444 09/06/24 1221 09/08/24 0327 09/09/24 0526 09/10/24 0214  NA 144  --  139 141 140  K 4.6  --  4.2 5.5* 4.5  CL 107  --  106 109 106  CO2 26  --  23 26 26   GLUCOSE 114*  --  164* 149* 138*  BUN 17  --  16 22 23   CREATININE 1.35* 1.01 1.03 1.16 1.02  CALCIUM 9.8  --  9.3 9.5 9.2  MG  --   --  1.8  --   --    GFR Estimated Creatinine Clearance: 45.9 mL/min (by C-G formula based on SCr of 1.02 mg/dL). Liver Function Tests: Recent Labs  Lab 09/08/24 0327  AST 30  ALT 18  ALKPHOS 50  BILITOT 1.2  PROT 5.4*  ALBUMIN  3.4*   No results for input(s): LIPASE, AMYLASE in the last 168 hours. No results for input(s): AMMONIA in the last 168 hours. Coagulation profile No results for input(s): INR, PROTIME in the last 168 hours.  CBC: Recent Labs  Lab 09/03/24 1444 09/06/24 1221 09/08/24 0327 09/09/24 0526 09/10/24 0214  WBC 7.8 11.3* 9.7 12.4* 14.6*  NEUTROABS  --   --   --  10.9* 12.8*   HGB 15.3 14.7 11.7* 11.7* 11.3*  HCT 47.1 45.2 35.3* 35.7* 33.9*  MCV 102.6* 100.7* 101.1* 101.7* 101.5*  PLT 151 142* PLATELET CLUMPS NOTED ON SMEAR, UNABLE TO ESTIMATE 93* 85*   Cardiac Enzymes: No results for input(s): CKTOTAL, CKMB, CKMBINDEX, TROPONINI in the last 168 hours. BNP: Invalid input(s): POCBNP CBG: Recent Labs  Lab 09/07/24 2140  GLUCAP 234*   D-Dimer No results for input(s): DDIMER in the last 72 hours. Hgb A1c No results for input(s): HGBA1C in the last 72 hours. Lipid Profile No results for input(s): CHOL, HDL, LDLCALC, TRIG, CHOLHDL, LDLDIRECT in the last 72 hours. Thyroid function studies No results for input(s): TSH, T4TOTAL, T3FREE, THYROIDAB in the last 72 hours.  Invalid input(s): FREET3 Anemia work up Recent Labs    09/08/24 1645  VITAMINB12 166*   Microbiology Recent Results (from the past 240 hours)  Surgical pcr screen     Status: Abnormal   Collection Time: 09/03/24  3:24 PM  Specimen: Nasal Mucosa; Nasal Swab  Result Value Ref Range Status   MRSA, PCR NEGATIVE NEGATIVE Final   Staphylococcus aureus POSITIVE (A) NEGATIVE Final    Comment: (NOTE) The Xpert SA Assay (FDA approved for NASAL specimens in patients 66 years of age and older), is one component of a comprehensive surveillance program. It is not intended to diagnose infection nor to guide or monitor treatment. Performed at [facility], [address], [place], [place] [address]     Time coordinating discharge: 45 minutes  Signed: Ashe Gago  [facility] 09/10/2024, 2:24 PM

## 2024-09-10 NOTE — Progress Notes (Signed)
 Occupational Therapy Treatment Patient Details Name: [name] MRN: [id number] DOB: February 15, 1933 Today's Date: 09/10/2024   History of present illness [name] is a 89 y.o. male admitted 09/06/24 after he tripped and fell walking to his bed in pre-op holding prior to kyphoplasty with Dr. Burnetta, sustaining left displaced femoral neck fracture. Kyphoplasty was cancelled. Pt s/p L THA 9/12. PMHx: A-Fib, CAD, PAD, chronic diastolic CHF, HLD, HTN, osteopenia, and prostate CA.   OT comments  Pt progressing toward established OT goals. Facilitating functional mobility/use of RUE with exercises listed below with OT facilitation of safety/normalized movement patterns supporting pt at wrist as needed. Engaged in education regarding LB ADL and shower transfers with pt able to perform with supervision-CGA. Will continue to follow. May benefit from education regarding median nerve glide in future session. Pt to continue to benefit from acute OT services. Recommending discharge home with HHOT and support from family.      If plan is discharge home, recommend the following:  A little help with walking and/or transfers;A lot of help with bathing/dressing/bathroom;Assistance with cooking/housework;Assist for transportation;Help with stairs or ramp for entrance   Equipment Recommendations  None recommended by OT (may need 3:1)    Recommendations for Other Services      Precautions / Restrictions Precautions Precautions: Fall Recall of Precautions/Restrictions: Intact Precaution/Restrictions Comments: No hip precautions Restrictions Weight Bearing Restrictions Per Provider Order: Yes LLE Weight Bearing Per Provider Order: Weight bearing as tolerated       Mobility Bed Mobility Overal bed mobility: Modified Independent                  Transfers Overall transfer level: Needs assistance Equipment used: Rolling walker (2 wheels) Transfers: Sit to/from Stand Sit to Stand:  Supervision           General transfer comment: good carryover for hand placement     Balance Overall balance assessment: Needs assistance Sitting-balance support: No upper extremity supported, Feet supported Sitting balance-Leahy Scale: Good     Standing balance support: During functional activity, No upper extremity supported, Bilateral upper extremity supported, Reliant on assistive device for balance Standing balance-Leahy Scale: Fair Standing balance comment: able to tolerate static standing with min reach outside of BOS without LOB                           ADL either performed or assessed with clinical judgement   ADL Overall ADL's : Needs assistance/impaired Eating/Feeding: Minimal assistance;Sitting Eating/Feeding Details (indicate cue type and reason): bringing cup to mouth with R hand as he normally would during self feeding, needing hands on guidance for coordination/control.                     Toilet Transfer: Supervision/safety;Ambulation;Rolling walker (2 wheels)       Tub/ Shower Transfer: Walk-in shower;Contact guard assist;Ambulation;Rolling walker (2 wheels) Tub/Shower Transfer Details (indicate cue type and reason): cues for carryover during new learning.        Extremity/Trunk Assessment Upper Extremity Assessment Upper Extremity Assessment: Left hand dominant;RUE deficits/detail (abidextrious) RUE Deficits / Details: 5/5 shoulder, pronation, wrist, gross grip strength, 3/5 supination, poor initiation of elbow flexion from extended position 0 degrees-85 degrees with poor motor control; able to perform AROM 90-full range, however, unable to tolerate min resistance, 4/5 elbow extension, reports numbness in finger tips. median nerve distribution tingling with tingling at distal portion each finger RUE Coordination: decreased fine motor;decreased  gross motor   Lower Extremity Assessment Lower Extremity Assessment: Defer to PT evaluation         Vision       Perception     Praxis     Communication Communication Communication: No apparent difficulties   Cognition Arousal: Alert Behavior During Therapy: WFL for tasks assessed/performed Cognition: No apparent impairments                               Following commands: Intact        Cueing   Cueing Techniques: Verbal cues  Exercises Exercises: Other exercises Other Exercises Other Exercises: isometric static hold elbow flexion ~80 degrees with OT guarding and hold for 5 seconds ~5 reps; cup to mouth 5 reps with OT facilitating stability at wrist due to weakness, pronation/supination 5x, isometric elbow extension; full range supinated hand elbow flexion.    Shoulder Instructions       General Comments VSS on RA    Pertinent Vitals/ Pain       Pain Assessment Pain Assessment: No/denies pain  Home Living                                          Prior Functioning/Environment              Frequency  Min 2X/week        Progress Toward Goals  OT Goals(current goals can now be found in the care plan section)  Progress towards OT goals: Progressing toward goals  Acute Rehab OT Goals OT Goal Formulation: With patient Time For Goal Achievement: 09/22/24 Potential to Achieve Goals: Good ADL Goals Pt Will Perform Eating: with modified independence;sitting (with R hand lead) Pt Will Perform Grooming: with supervision;standing Pt Will Perform Lower Body Bathing: with supervision;with adaptive equipment;sit to/from stand Pt Will Perform Lower Body Dressing: with supervision;with adaptive equipment;sit to/from stand Pt Will Transfer to Toilet: with supervision;ambulating (handicapped height) Pt Will Perform Toileting - Clothing Manipulation and hygiene: with supervision;sit to/from stand Pt Will Perform Tub/Shower Transfer: Shower transfer;with supervision;ambulating;shower seat Pt/caregiver will Perform Home Exercise  Program: Increased strength;Right Upper extremity;Independently;With written HEP provided  Plan      Co-evaluation                 AM-PAC OT 6 Clicks Daily Activity     Outcome Measure   Help from another person eating meals?: None Help from another person taking care of personal grooming?: A Little Help from another person toileting, which includes using toliet, bedpan, or urinal?: A Little Help from another person bathing (including washing, rinsing, drying)?: A Little Help from another person to put on and taking off regular upper body clothing?: A Little Help from another person to put on and taking off regular lower body clothing?: A Little 6 Click Score: 19    End of Session Equipment Utilized During Treatment: Gait belt;Rolling walker (2 wheels)  OT Visit Diagnosis: Unsteadiness on feet (R26.81);Other abnormalities of gait and mobility (R26.89);Muscle weakness (generalized) (M62.81);Pain   Activity Tolerance Patient tolerated treatment well   Patient Left in chair;with call bell/phone within reach;with chair alarm set;with family/visitor present   Nurse Communication Mobility status        Time: 9144-9074 OT Time Calculation (min): 30 min  Charges: OT General Charges $OT Visit: 1 Visit OT Treatments $Self  Care/Home Management : 8-22 mins $Therapeutic Exercise: 8-22 mins  Elma JONETTA Lebron FREDERICK, OTR/L [facility] Acute Rehabilitation Office: [phone]   Elma JONETTA Lebron 09/10/2024, 10:32 AM

## 2024-09-10 NOTE — Progress Notes (Signed)
 Physical Therapy Treatment Patient Details Name: [name] MRN: [id number] DOB: Apr 11, 1933 Today's Date: 09/10/2024   History of Present Illness [name] is a 89 y.o. male admitted 09/06/24 after he tripped and fell walking to his bed in pre-op holding prior to kyphoplasty with Dr. Burnetta, sustaining left displaced femoral neck fracture. Kyphoplasty was cancelled. Pt s/p L THA 9/12. PMHx: A-Fib, CAD, PAD, chronic diastolic CHF, HLD, HTN, osteopenia, and prostate CA.    PT Comments  Pt demo mod I bed mobility. Supervision transfers and amb 350' with RW. CGA ascend/descend 10 steps with R rail. Reviewed LE HEP handouts. Current POC remains appropriate.     If plan is discharge home, recommend the following: A little help with walking and/or transfers;A little help with bathing/dressing/bathroom;Assistance with cooking/housework;Assist for transportation;Help with stairs or ramp for entrance   Can travel by private vehicle        Equipment Recommendations  Rolling walker (2 wheels);BSC/3in1    Recommendations for Other Services       Precautions / Restrictions Precautions Precautions: Fall Recall of Precautions/Restrictions: Intact Precaution/Restrictions Comments: No hip precautions Restrictions LLE Weight Bearing Per Provider Order: Weight bearing as tolerated     Mobility  Bed Mobility Overal bed mobility: Modified Independent             General bed mobility comments: +rail    Transfers Overall transfer level: Needs assistance Equipment used: Rolling walker (2 wheels) Transfers: Sit to/from Stand Sit to Stand: Supervision           General transfer comment: Pt stood from lowest bed height. Demo good technique. No physical assist.    Ambulation/Gait Ambulation/Gait assistance: Supervision Gait Distance (Feet): 350 Feet Assistive device: Rolling walker (2 wheels) Gait Pattern/deviations: Step-through pattern, Decreased stride length Gait  velocity: WFL Gait velocity interpretation: 1.31 - 2.62 ft/sec, indicative of limited community ambulator   General Gait Details: Cues for proximity to [employer] Stairs: Yes Stairs assistance: Contact guard assist Stair Management: One rail Right, Forwards, Step to pattern Number of Stairs: 10     Wheelchair Mobility     Tilt Bed    Modified Rankin (Stroke Patients Only)       Balance Overall balance assessment: Needs assistance Sitting-balance support: No upper extremity supported, Feet supported Sitting balance-Leahy Scale: Good     Standing balance support: Bilateral upper extremity supported, During functional activity, Reliant on assistive device for balance Standing balance-Leahy Scale: Poor                              Communication Communication Communication: No apparent difficulties  Cognition Arousal: Alert Behavior During Therapy: WFL for tasks assessed/performed   PT - Cognitive impairments: No apparent impairments                         Following commands: Intact      Cueing Cueing Techniques: Verbal cues  Exercises Other Exercises Other Exercises: reviewed supine exercises on HEP handouts    General Comments General comments (skin integrity, edema, etc.): VSS on RA      Pertinent Vitals/Pain Pain Assessment Pain Assessment: Faces Faces Pain Scale: Hurts a little bit Pain Location: L hip Pain Descriptors / Indicators: Discomfort Pain Intervention(s): Monitored during session, Repositioned    Home Living  Prior Function            PT Goals (current goals can now be found in the care plan section) Acute Rehab PT Goals Patient Stated Goal: independence Progress towards PT goals: Progressing toward goals    Frequency    Min 2X/week      PT Plan      Co-evaluation              AM-PAC PT 6 Clicks Mobility   Outcome Measure  Help needed turning from your  back to your side while in a flat bed without using bedrails?: None Help needed moving from lying on your back to sitting on the side of a flat bed without using bedrails?: None Help needed moving to and from a bed to a chair (including a wheelchair)?: A Little Help needed standing up from a chair using your arms (e.g., wheelchair or bedside chair)?: A Little Help needed to walk in hospital room?: A Little Help needed climbing 3-5 steps with a railing? : A Little 6 Click Score: 20    End of Session Equipment Utilized During Treatment: Gait belt Activity Tolerance: Patient tolerated treatment well Patient left: in chair;with call bell/phone within reach;with family/visitor present Nurse Communication: Mobility status PT Visit Diagnosis: Difficulty in walking, not elsewhere classified (R26.2);Other abnormalities of gait and mobility (R26.89);Unsteadiness on feet (R26.81)     Time: 9194-9166 PT Time Calculation (min) (ACUTE ONLY): 28 min  Charges:    $Gait Training: 8-22 mins $Therapeutic Activity: 8-22 mins PT General Charges $$ ACUTE PT VISIT: 1 Visit                     Sari MATSU., PT  Office # [phone]    Erven Sari Shaker 09/10/2024, 8:41 AM

## 2024-09-10 NOTE — TOC Initial Note (Addendum)
 Transition of Care (TOC) - Initial/Assessment Note   Spoke to patient and his daughter at bedside.   Patient from home. Daughter and son in law live with him. Prior to admission patient independent , walked 2 miles a day , did not use any DME and drove.   Discussed PT/OT recommendations for HHPT/OT , rolling walker and 3 in1. Both in agreement.   Provided medicare.gov list of home health agencies. They will review and call someone they know who had home health and liked the agency. NCM will follow up with them on decision.   Walker and 3 in 1 ordered with Jermaine with [facility]   Followed up with patient. Patient would like [facility] for home health PT/OT. Arna with [facility] accepted referral  Patient Details  Name: [name] MRN: [id number] Date of Birth: 1933-05-20  [facility] CM/SW Contact:    Stephane Powell Jansky, RN Phone Number: 09/10/2024, 10:10 AM  Clinical Narrative:                   Expected Discharge Plan: Home w Home Health Services Barriers to Discharge: Continued Medical Work up   Patient Goals and CMS Choice Patient states their goals for this hospitalization and ongoing recovery are:: to return to home CMS Medicare.gov Compare Post Acute Care list provided to:: Patient Choice offered to / list presented to : Patient      Expected Discharge Plan and Services   Discharge Planning Services: CM Consult Post Acute Care Choice: Home Health, Durable Medical Equipment Living arrangements for the past 2 months: Single Family Home                 DME Arranged: 3-N-1, Walker rolling DME Agency: [employer] Date DME Agency Contacted: 09/10/24 Time DME Agency Contacted: 1009 Representative spoke with at DME Agency: [place] HH Arranged: PT, OT          Prior Living Arrangements/Services Living arrangements for the past 2 months: Single Family Home Lives with:: Adult Children Patient language and need for interpreter  reviewed:: Yes Do you feel safe going back to the place where you live?: Yes      Need for Family Participation in Patient Care: Yes (Comment) Care giver support system in place?: Yes (comment)   Criminal Activity/Legal Involvement Pertinent to Current Situation/Hospitalization: No - Comment as needed  Activities of Daily Living   ADL Screening (condition at time of admission) Independently performs ADLs?: Yes (appropriate for developmental age) Is the patient deaf or have difficulty hearing?: No Does the patient have difficulty seeing, even when wearing glasses/contacts?: No Does the patient have difficulty concentrating, remembering, or making decisions?: No  Permission Sought/Granted   Permission granted to share information with : Yes, Verbal Permission Granted  Share Information with NAME: daughter Zebedee  Permission granted to share info w AGENCY: DME agency,        Emotional Assessment Appearance:: Appears stated age Attitude/Demeanor/Rapport: Engaged Affect (typically observed): Appropriate Orientation: : Oriented to Self, Oriented to Place, Oriented to  Time, Oriented to Situation Alcohol / Substance Use: Not Applicable Psych Involvement: No (comment)  Admission diagnosis:  Osteoporotic compression fracture of vertebra, initial encounter (HCC) [M80.88XA] Closed left hip fracture (HCC) [S72.002A] Patient Active Problem List   Diagnosis Date Noted   Presence of stent in LAD coronary artery 09/09/2024   Chronic HFrEF (heart failure with reduced ejection fraction) (HCC) 09/09/2024   Persistent atrial fibrillation (HCC) 09/09/2024   Atherosclerosis of native coronary  artery of native heart without angina pectoris 09/09/2024   Preoperative clearance 09/07/2024   Closed left hip fracture (HCC) 09/06/2024   History of myocardial infarction 10/02/2020   Anticoagulated 10/02/2020   Chronic atrial fibrillation (HCC) 11/14/2013   CAD S/P percutaneous coronary angioplasty     Essential hypertension    Ischemic cardiomyopathy    Dyslipidemia, goal LDL below 70    History of prostate cancer 05/05/2009   PCP:  [name], MD Pharmacy:   [facility] [address], [place] - [address] [place] [address] Phone: [phone] Fax: [phone]     Social Drivers of Health (SDOH) Social History: SDOH Screenings   Food Insecurity: No Food Insecurity (09/06/2024)  Housing: Low Risk  (09/06/2024)  Transportation Needs: No Transportation Needs (09/06/2024)  Utilities: Not At Risk (09/06/2024)  Social Connections: Socially Isolated (09/06/2024)  Tobacco Use: Low Risk  (09/07/2024)   SDOH Interventions:     Readmission Risk Interventions     No data to display

## 2024-09-10 NOTE — TOC CM/SW Note (Signed)
    Durable Medical Equipment  (From admission, onward)           Start     Ordered   09/10/24 0901  For home use only DME Walker rolling  Once       Comments: S/p ARTHROPLASTY, HIP, TOTAL, ANTERIOR APPROACH, LEFT  Question Answer Comment  Walker: With 5 Inch Wheels   Patient needs a walker to treat with the following condition Weakness      09/10/24 0900   09/10/24 0901  For home use only DME 3 n 1  Once       Comments: ARTHROPLASTY, HIP, TOTAL, ANTERIOR APPROACH, LEFT (   09/10/24 0900           Patient confined to a room with no bathroom , therefore needs a 3 in 1 / bedside commode

## 2024-09-10 NOTE — Discharge Instructions (Signed)
 Orthopaedic Trauma Service Discharge Instructions   General Discharge Instructions  WEIGHT BEARING STATUS: Weightbearing as tolerated left lower extremity  RANGE OF MOTION/ACTIVITY: Okay for unrestricted hip and knee range of motion  Wound Care: Keep your Aquacel dressing in place until follow-up.  You are okay to shower and allow the dressing to get wet.  If the Aquacel dressing becomes soiled or falls off, clean gently around your incision with soap and water  before applying a new dry dressing.    DVT/PE prophylaxis: Xarelto   Diet: as you were eating previously.  Can use over the counter stool softeners and bowel preparations, such as Miralax , to help with bowel movements.  Narcotics can be constipating.  Be sure to drink plenty of fluids  PAIN MEDICATION USE AND EXPECTATIONS  You have likely been given narcotic medications to help control your pain.  After a traumatic event that results in an fracture (broken bone) with or without surgery, it is ok to use narcotic pain medications to help control one's pain.  We understand that everyone responds to pain differently and each individual patient will be evaluated on a regular basis for the continued need for narcotic medications. Ideally, narcotic medication use should last no more than 6-8 weeks (coinciding with fracture healing).   As a patient it is your responsibility as well to monitor narcotic medication use and report the amount and frequency you use these medications when you come to your office visit.   We would also advise that if you are using narcotic medications, you should take a dose prior to therapy to maximize you participation.  IF YOU ARE ON NARCOTIC MEDICATIONS IT IS NOT PERMISSIBLE TO OPERATE A MOTOR VEHICLE (MOTORCYCLE/CAR/TRUCK/MOPED) OR HEAVY MACHINERY DO NOT MIX NARCOTICS WITH OTHER CNS (CENTRAL NERVOUS SYSTEM) DEPRESSANTS SUCH AS ALCOHOL  POST-OPERATIVE OPIOID TAPER INSTRUCTIONS: It is important to wean off of  your opioid medication as soon as possible. If you do not need pain medication after your surgery it is ok to stop day one. Opioids include: Codeine, Hydrocodone(Norco, Vicodin), Oxycodone (Percocet, oxycontin ) and hydromorphone amongst others.  Long term and even short term use of opiods can cause: Increased pain response Dependence Constipation Depression Respiratory depression And more.  Withdrawal symptoms can include Flu like symptoms Nausea, vomiting And more Techniques to manage these symptoms Hydrate well Eat regular healthy meals Stay active Use relaxation techniques(deep breathing, meditating, yoga) Do Not substitute Alcohol to help with tapering If you have been on opioids for less than two weeks and do not have pain than it is ok to stop all together.  Plan to wean off of opioids This plan should start within one week post op of your fracture surgery  Maintain the same interval or time between taking each dose and first decrease the dose.  Cut the total daily intake of opioids by one tablet each day Next start to increase the time between doses. The last dose that should be eliminated is the evening dose.    STOP SMOKING OR USING NICOTINE PRODUCTS!!!!  As discussed nicotine severely impairs your body's ability to heal surgical and traumatic wounds but also impairs bone healing.  Wounds and bone heal by forming microscopic blood vessels (angiogenesis) and nicotine is a vasoconstrictor (essentially, shrinks blood vessels).  Therefore, if vasoconstriction occurs to these microscopic blood vessels they essentially disappear and are unable to deliver necessary nutrients to the healing tissue.  This is one modifiable factor that you can do to dramatically increase your chances of  healing your injury.  (This means no smoking, no nicotine gum, patches, etc)  ICE AND ELEVATE INJURED/OPERATIVE EXTREMITY  Using ice and elevating the injured extremity above your heart can help with  swelling and pain control.  Icing in a pulsatile fashion, such as 20 minutes on and 20 minutes off, can be followed.    Do not place ice directly on skin. Make sure there is a barrier between to skin and the ice pack.    Using frozen items such as frozen peas works well as the conform nicely to the are that needs to be iced.  USE AN ACE WRAP OR TED HOSE FOR SWELLING CONTROL  In addition to icing and elevation, Ace wraps or TED hose are used to help limit and resolve swelling.  It is recommended to use Ace wraps or TED hose until you are informed to stop.    When using Ace Wraps start the wrapping distally (farthest away from the body) and wrap proximally (closer to the body)   Example: If you had surgery on your leg or thing and you do not have a splint on, start the ace wrap at the toes and work your way up to the thigh        If you had surgery on your upper extremity and do not have a splint on, start the ace wrap at your fingers and work your way up to the upper arm   CALL THE OFFICE FOR MEDICATION REFILLS OR WITH ANY QUESTIONS/CONCERNS: [phone]   VISIT OUR WEBSITE FOR ADDITIONAL INFORMATION: [email or web address]     Discharge Wound Care Instructions  Do NOT apply any ointments, solutions or lotions to surgical wounds.  These prevent needed drainage and even though solutions like hydrogen peroxide kill bacteria, they also damage cells lining the pin sites that help fight infection.  Applying lotions or ointments can keep the wounds moist and can cause them to breakdown and open up as well. This can increase the risk for infection. When in doubt call the office.  Call office for the following: Temperature greater than 101F Persistent nausea and vomiting Severe uncontrolled pain Redness, tenderness, or signs of infection (pain, swelling, redness, odor or green/yellow discharge around the site) Difficulty breathing, headache or visual disturbances Hives Persistent dizziness or  light-headedness Extreme fatigue Any other questions or concerns you may have after discharge  In an emergency, call 911 or go to an Emergency Department at a nearby hospital  OTHER HELPFUL INFORMATION  You should wean off your narcotic medicines as soon as you are able.  Most patients will be off or using minimal narcotics before their first postop appointment.   Do not drink alcoholic beverages or take illicit drugs when taking pain medications.  In most states it is against the law to drive while you are in a splint or sling.  And certainly against the law to drive while taking narcotics.  Pain medication may make you constipated.  Below are a few solutions to try in this order: Decrease the amount of pain medication if you aren't having pain. Drink lots of decaffeinated fluids. Drink prune juice and/or each dried prunes  If the first 3 don't work start with additional solutions Take Colace - an over-the-counter stool softener Take Senokot - an over-the-counter laxative Take Miralax  - a stronger over-the-counter laxative

## 2024-09-10 NOTE — Progress Notes (Signed)
 Orthopaedic Trauma Progress Note  SUBJECTIVE: Patient doing extremely well today.  Pain has been well-controlled.  He has been up and mobilizing with therapies and has done extremely well with this.  He has been tolerating diet and fluids.  No chest pain. No SOB. No nausea/vomiting.  Continues to have right arm weakness and numbness.  Notes that symptoms are most noticeable when arm is in a supinated position.  Has difficulty utilizing that hand and fingers when trying to tie his pants no change in symptoms over the last 24 hours.  Patient's daughter at bedside.  Patient eager to discharge home today.  MRI of the cervical spine shows multilevel cervical spondylosis with mild spinal stenosis.  OBJECTIVE:  Vitals:   09/10/24 0411 09/10/24 0914  BP: 108/72 130/85  Pulse: 85   Resp: 17 17  Temp: 97.8 F (36.6 C) 97.7 F (36.5 C)  SpO2: 98% 99%    Opiates Today (MME): Today's  total administered Morphine Milligram Equivalents: 0 Opiates Yesterday (MME): Yesterday's total administered Morphine Milligram Equivalents: 0  General: Sitting up in bedside chair, no acute distress.  Pleasant and cooperative Respiratory: No increased work of breathing.   Operative Extremity (left lower extremity): Aquacel dressing to the anterior hip is clean, dry, intact.  No significant tenderness to palpation over the hip or throughout the thigh.  Able to get full knee extension.  Ankle DF/PF intact.  No significant calf tenderness.  Compartments are soft and compressible. + DP pulse  RUE: Nontender to palpation over the shoulder, elbow, wrist, hand.  Full range of motion of the shoulder in all directions. Negative Tinel's test at the elbow and wrist level.  Unable to control active motion of the elbow from 0 to 90 degrees., patient unable to control active motion from 90-0 degrees, arm immediately drops onto bed.  Full range of motion of the wrist and hand. Equal grip strength bilaterally.  Patient reports decreased  sensation and tingling in distal phalanges 1-5 on palmar side only from DIP joints through finger tufts. RUE intention tremor present when reaching for any objects and trying fine motor movements like unscrewing a cap  IMAGING: Stable post op imaging left hip.   LABS:  Results for orders placed or performed during the hospital encounter of 09/06/24 (from the past 24 hours)  CBC with Differential/Platelet     Status: Abnormal   Collection Time: 09/10/24  2:14 AM  Result Value Ref Range   WBC 14.6 (H) 4.0 - 10.5 K/uL   RBC 3.34 (L) 4.22 - 5.81 MIL/uL   Hemoglobin 11.3 (L) 13.0 - 17.0 g/dL   HCT 66.0 (L) 60.9 - 47.9 %   MCV 101.5 (H) 80.0 - 100.0 fL   MCH 33.8 26.0 - 34.0 pg   MCHC 33.3 30.0 - 36.0 g/dL   RDW 85.9 88.4 - 84.4 %   Platelets 85 (L) 150 - 400 K/uL   nRBC 0.0 0.0 - 0.2 %   Neutrophils Relative % 87 %   Neutro Abs 12.8 (H) 1.7 - 7.7 K/uL   Lymphocytes Relative 7 %   Lymphs Abs 1.0 0.7 - 4.0 K/uL   Monocytes Relative 5 %   Monocytes Absolute 0.7 0.1 - 1.0 K/uL   Eosinophils Relative 0 %   Eosinophils Absolute 0.0 0.0 - 0.5 K/uL   Basophils Relative 0 %   Basophils Absolute 0.0 0.0 - 0.1 K/uL   Immature Granulocytes 1 %   Abs Immature Granulocytes 0.12 (H) 0.00 - 0.07 K/uL  Basic metabolic panel with GFR     Status: Abnormal   Collection Time: 09/10/24  2:14 AM  Result Value Ref Range   Sodium 140 135 - 145 mmol/L   Potassium 4.5 3.5 - 5.1 mmol/L   Chloride 106 98 - 111 mmol/L   CO2 26 22 - 32 mmol/L   Glucose, Bld 138 (H) 70 - 99 mg/dL   BUN 23 8 - 23 mg/dL   Creatinine, Ser 8.97 0.61 - 1.24 mg/dL   Calcium 9.2 8.9 - 89.6 mg/dL   GFR, Estimated >39 >39 mL/min   Anion gap 8 5 - 15    ASSESSMENT: [name] is a 89 y.o. male, 3 Days Post-Op s/p fall Procedures: LEFT TOTAL HIP ARTHROPLASTY FOR FRACTURE, ANTERIOR APPROACH   CV/Blood loss: Acute blood loss anemia, Hgb 11.3 this AM. Hemodynamically stable  PLAN: Weightbearing: WBAT LLE ROM: Unrestricted  ROM.  No hip precautions needed Incisional and dressing care: Dressings left intact until follow-up  Showering: Okay to shower.  Aquacel dressing may get wet Orthopedic device(s): None  Pain management:  1. Tylenol  1000 mg TID 2. Robaxin  500 mg TID 3. Oxycodone  5 mg q 4 hours PRN 4. Neurontin  300 mg TID VTE prophylaxis: Okay to resume home dose Xarelto  today.  Continue SCDs ID:  Ancef  2gm post op completed Foley/Lines:  No foley, KVO IVFs Dispo: PT/OT evaluation ongoing, recommending home health therapies.  Okay for discharge from ortho standpoint once cleared by medicine team and therapies.  My office will set up a EMG/nerve conduction study to be completed on an outpatient basis, hopefully hopefully to be completed within the next 1 to 2 weeks.  D/C recommendations: - Tylenol , Robaxin , oxycodone , home dose Neurontin  for pain control - Home dose Xarelto  for DVT prophylaxis  Follow - up plan: 09/25/2024 at 10 AM with Dr. Callie wound check and repeat x-rays. Will review results of EMG/NCS with patient at first postop appointment.   Contact information:  Franky Light MD, Lauraine Moores PA-C. After hours and holidays please check [email or web address] for group call information for [facility]   Lauraine PATRIC Moores, PA-C [phone] (office) [email or web address]

## 2024-09-12 ENCOUNTER — Encounter: Payer: Self-pay | Admitting: Cardiology

## 2024-09-13 ENCOUNTER — Other Ambulatory Visit: Payer: Self-pay

## 2024-09-13 MED ORDER — FUROSEMIDE 20 MG PO TABS: 20.0000 mg | ORAL_TABLET | Freq: Every day | ORAL | 3 refills | Status: DC

## 2024-09-13 NOTE — Telephone Encounter (Signed)
 Spoke to patient's daughter Zebedee Dr.Jordan advised to start Lasix  20 mg daily.Restrict sodium.Follow up appointment scheduled with Dr.Jordan 10/8 at 3:20 pm.

## 2024-09-23 NOTE — Progress Notes (Signed)
 [name] Date of Birth: 1933/01/04   History of Present Illness: Skip is seen today for followup CHF and increased edema.  He has a history of an anterior myocardial infarction in November 2009. This was treated with a drug-eluting stent to the mid LAD.  He had a stress Myoview  study November 2012. He is able to walk for 8 minutes. He had no clinical symptoms. Images showed a fixed anterior septal and apical defect. There was no ischemia. Ejection fraction was 44%.   In November 2014 he was found to be in atrial fibrillation with a controlled response. He was started on Xarelto . Echo showed EF 40-45% with moderate biatrial enlargement. Treated with rate control and anticoagulation.   He was recently seen in the ED on 09/05/23 and treated for community acquired PNA. Troponins normal. Ecg without acute change. Afib controlled. BNP was elevated 449. This was repeated on 9/17 and was 466. No old values for comparison. Echo done showing EF 35-40%. Moderate pulmonary HTN with RV systolic pressure estimated at 54 mm Hg. Severe biatrial enlargement. Moderate MR. He had potassium of 3.4 in ED. This was repleted and on repeat was 5.7 at PCP.   We performed a cardiac CT PET to evaluate further and this was interpreted as high risk. Cardiac cath showed nonobstructive disease except for a small PDA. He had mild pulmonary HTN with normal LV filling pressures.  Repeat Echo recently showed no change in EF but improvement in pulmonary pressures and RV function.   On Sept 11 He was scheduled for kyphoplasty for L5 compression fracture but had a fall with fracture of his left hip. He underwent left THR. Also placed on steroids for cervical radiculopathy. Noted significant increase in LE edema after hospitalization and lasix  was resumed.  On follow  up today LE edema has resolved completely. No longer on steroids. Still getting PT twice a week. Hoping to graduate from walker. Did not feel well on gabapentin   and this was stopped.     Current Outpatient Medications on File Prior to Visit  Medication Sig Dispense Refill   Cholecalciferol  (VITAMIN D3) 2000 UNITS TABS Take 2,000 Int'l Units by mouth every evening.     cyanocobalamin (VITAMIN B12) 1000 MCG tablet Take 1 tablet (1,000 mcg total) by mouth daily. 30 tablet 0   dapagliflozin  propanediol (FARXIGA ) 10 MG TABS tablet Take 1 tablet (10 mg total) by mouth daily before breakfast. 90 tablet 3   methocarbamol  (ROBAXIN ) 500 MG tablet Take 1-2 tablets (500-1,000 mg total) by mouth every 6 (six) hours as needed. 60 tablet 2   polyethylene glycol (MIRALAX ) 17 g packet Take 17 g by mouth daily as needed for severe constipation. 14 each 0   rivaroxaban  (XARELTO ) 20 MG TABS tablet TAKE 1 TABLET BY MOUTH DAILY WITH SUPPER 90 tablet 3   sacubitril -valsartan  (ENTRESTO ) 97-103 MG Take 1 tablet by mouth 2 (two) times daily. 180 tablet 3   senna-docusate (SENOKOT-S) 8.6-50 MG tablet Take 1 tablet by mouth daily. 30 tablet 0   simvastatin  (ZOCOR ) 40 MG tablet TAKE 1 TABLET BY MOUTH AT BEDTIME 90 tablet 3   spironolactone  (ALDACTONE ) 25 MG tablet Take 0.5 tablets (12.5 mg total) by mouth daily. 45 tablet 3   furosemide  (LASIX ) 20 MG tablet Take 1 tablet (20 mg total) by mouth daily. 90 tablet 3   No current facility-administered medications on file prior to visit.    No Known Allergies  Past Medical History:  Diagnosis Date  Atrial fibrillation [facility])    Coronary artery disease    Nov 2009-MI-stenting of the mid LAD drug-eluding stent   Hx of radiation therapy 05/15/14- 07/11/14   prostate 7800 cGy 40 sessions, seminal vesicles 5600 cGy 40 sessions   Hyperlipidemia    Hypertension    LV dysfunction    MI (myocardial infarction) (HCC) 1998   Osteopenia    Prostate CA (HCC) 05/05/2009   prostate cancer 11/09-tx with cryotherapy, Dr Chales    Past Surgical History:  Procedure Laterality Date   CARDIAC CATHETERIZATION     Nov 2009-drug eluding  stent to mid LAD   HERNIA REPAIR     inguinal   IR KYPHO EA ADDL LEVEL THORACIC OR LUMBAR  04/28/2023   IR KYPHO LUMBAR INC FX REDUCE BONE BX UNI/BIL CANNULATION INC/IMAGING  04/28/2023   KYPHOPLASTY N/A 09/06/2024   Procedure: KYPHOPLASTY;  Surgeon: Burnetta Aures, MD;  Location: [facility] OR;  Service: Orthopedics;  Laterality: N/A;   PROSTATE BIOPSY  09/25/2008   gleason 4+3=7   PROSTATE CRYOABLATION  05/05/2009   Dr Chales   RIGHT/LEFT HEART CATH AND CORONARY ANGIOGRAPHY N/A 11/02/2023   Procedure: RIGHT/LEFT HEART CATH AND CORONARY ANGIOGRAPHY;  Surgeon: [place], Rylie Knierim M, MD;  Location: [facility] INVASIVE CV LAB;  Service: Cardiovascular;  Laterality: N/A;   TOTAL HIP ARTHROPLASTY N/A 09/07/2024   Procedure: ARTHROPLASTY, HIP, TOTAL, ANTERIOR APPROACH, LEFT;  Surgeon: Kendal Franky SQUIBB, MD;  Location: [facility] OR;  Service: Orthopedics;  Laterality: N/A;  LEFT TOTAL HIP ANTERIOR    Social History   Tobacco Use  Smoking Status Never  Smokeless Tobacco Never    Social History   Substance and Sexual Activity  Alcohol Use No    Family History  Problem Relation Age of Onset   Heart disease Mother    Heart failure Mother    Heart disease Brother    Hematuria Father    Cancer Father        prostate   Heart disease Sister     Review of Systems: As noted in history of present illness.  All other systems were reviewed and are negative.  Physical Exam: BP 100/64 (BP Location: Left Arm, Patient Position: Sitting, Cuff Size: Normal)   Pulse 77   Ht 5' 6.5 (1.689 m)   Wt 145 lb (65.8 kg)   SpO2 99%   BMI 23.05 kg/m  GENERAL:  Well appearing elderly WM in NAD HEENT:  PERRL, EOMI, sclera are clear. Oropharynx is clear. NECK:  no JVD. carotid upstroke brisk and symmetric, no bruits, no thyromegaly or adenopathy LUNGS:  Clear to auscultation bilaterally CHEST:  Unremarkable HEART:  IRRR,  PMI not displaced or sustained,S1 and S2 within normal limits, no S3, no S4: no clicks, no rubs, no  murmurs ABD:  Soft, nontender. BS +, no masses or bruits. No hepatomegaly, no splenomegaly EXT:  2 + pulses throughout, No edema, no cyanosis no clubbing SKIN:  Warm and dry.  No rashes NEURO:  Alert and oriented x 3. Cranial nerves II through XII intact. PSYCH:  Cognitively intact   LABORATORY DATA: Lab Results  Component Value Date   WBC 14.6 (H) 09/10/2024   HGB 11.3 (L) 09/10/2024   HCT 33.9 (L) 09/10/2024   PLT 85 (L) 09/10/2024   GLUCOSE 138 (H) 09/10/2024   CHOL 166 05/22/2024   TRIG 99 05/22/2024   HDL 68 05/22/2024   LDLCALC 80 05/22/2024   ALT 18 09/08/2024   AST 30 09/08/2024   NA  140 09/10/2024   K 4.5 09/10/2024   CL 106 09/10/2024   CREATININE 1.02 09/10/2024   BUN 23 09/10/2024   CO2 26 09/10/2024   TSH 2.450 05/22/2024   INR 1.1 10/26/2023   Labs dated 10/04/19: cholesterol 145, triglycerides 75, HDL 68, LDL 61. CBC and chemistries normal Dated 10/21/20: cholesterol 141, triglycerides 64, HDL 71, LDL 56. CBC normal Dated 11/17/20: potassium 5.4 otherwise CMET normal. Dated 11/03/21: cholesterol 139, triglycerides 64, HDL 71, LDL 55. CMET and CBC normal. Dated 11/10/22: cholesterol 145, triglycerides 63, HDL 71, LDL 61. CBC and CMET normal Dated 09/12/23: potassium 5.7 otherwise BMET normal Dated 09/18/24: normal blood count. Macrocytic indices but B12 and folate normal. TSH normal.           Echo 09/16/23: IMPRESSIONS     1. Left ventricular ejection fraction, by estimation, is 35 to 40%. The  left ventricle has moderately decreased function. The left ventricle  demonstrates regional wall motion abnormalities (see scoring  diagram/findings for description). The left  ventricular internal cavity size was moderately dilated. Left ventricular  diastolic function could not be evaluated. There is disproportionately  severe hypokinesis in the apex and mid-apical anterior and septal wlls.   2. Right ventricular systolic function is moderately reduced. The  right  ventricular size is mildly enlarged. There is moderately elevated  pulmonary artery systolic pressure. The estimated right ventricular  systolic pressure is 53.9 mmHg.   3. Left atrial size was severely dilated.   4. Right atrial size was severely dilated.   5. The mitral valve is normal in structure. Moderate mitral valve  regurgitation. No evidence of mitral stenosis.   6. The aortic valve is tricuspid. There is mild calcification of the  aortic valve. There is mild thickening of the aortic valve. Aortic valve  regurgitation is not visualized. Aortic valve sclerosis/calcification is  present, without any evidence of  aortic stenosis.   7. Aortic dilatation noted. There is borderline dilatation of the aortic  root, measuring 38 mm. There is borderline dilatation of the ascending  aorta, measuring 38 mm.   8. The inferior vena cava is dilated in size with <50% respiratory  variability, suggesting right atrial pressure of 15 mmHg.   Comparison(s): Prior images unable to be directly viewed, comparison made  by report only.   PET CT 10/12/23:   There is a medium size, severe, fixed defect present in the apical septum and apex consistent with prior LAD infarction. There is a largely fixed, medium size, severe defect present in the basal to mid lateral wall consistent with infarction in the LCX territory. LVEF is moderately reduced and increases slightly with stress (32%->35%). MBF is not accurate due to prior revascularization. The findings are consistent with the patient's known LAD infarction, but the lateral infarct appears new. There is no ischemia on this study. High-risk study based on new infarction and moderately reduced LVEF. Findings suggest ischemic cardiomyopathy.   LV perfusion is abnormal. There is no evidence of ischemia. There is evidence of infarction. Defect 1: There is a medium defect with severe reduction in uptake present in the apical to mid septal and apex location(s)  that is fixed. There is abnormal wall motion in the defect area. Consistent with infarction. Defect 2: There is a medium defect with severe reduction in uptake present in the mid to basal lateral location(s) that is fixed. There is abnormal wall motion in the defect area. Consistent with infarction.   Rest left ventricular function is  abnormal. Rest global function is moderately reduced. There were multiple regional abnormalities. Rest EF: 32%. Stress left ventricular function is abnormal. Stress global function is moderately reduced. There were multiple regional abnormalities. Stress EF: 34%. End diastolic cavity size is mildly enlarged.   Myocardial blood flow reserve is not reported in this patient due to technical or patient-specific concerns that affect accuracy.   Coronary calcium assessment not performed due to prior revascularization.   Findings are consistent with infarction. The study is high risk.   Electronically signed by Darryle Decent, MD ____________________________________________  Cardiac cath 11/02/23:  RIGHT/LEFT HEART CATH AND CORONARY ANGIOGRAPHY   Conclusion  Single vessel obstructive disease involving the PDA. This is not significantly changed from 2009. Widely patent LAD stent. Otherwise nonobstructive disease Normal LV filling pressures. LVEDP 11 mm Hg. PCWP 13/14 with mean 14 mm Hg Mild pulmonary HTN. PAP 43/21 mean 26 mm Hg. RA pressure normal 6 mm Hg Cardiac output 4.2 L/min, index 2.23.    Plan: recommend optimizing CHF therapy.    Diagnostic Dominance: Right  Intervention  Echo 07/30/24: IMPRESSIONS     1. Left ventricular ejection fraction, by estimation, is 35 to 40%. The  left ventricle has moderately decreased function. The left ventricle  demonstrates global hypokinesis. There is mild left ventricular  hypertrophy of the basal-septal segment. Left  ventricular diastolic function could not be evaluated.   2. Right ventricular systolic function is  mildly reduced. The right  ventricular size is normal. There is normal pulmonary artery systolic  pressure.   3. Left atrial size was severely dilated.   4. The mitral valve is degenerative. Moderate mitral valve regurgitation.  No evidence of mitral stenosis.   5. The aortic valve is tricuspid. Aortic valve regurgitation is not  visualized. Aortic valve sclerosis/calcification is present, without any  evidence of aortic stenosis.   6. Aortic dilatation noted. There is mild dilatation of the ascending  aorta, measuring 39 mm.   7. The inferior vena cava is normal in size with greater than 50%  respiratory variability, suggesting right atrial pressure of 3 mmHg.    Assessment / Plan: 1. Coronary disease with remote anterior myocardial infarction 2009 treated with drug-eluting stent to the LAD. Recent cardiac cath in November showed patent LAD stent. No obstructive disease in LCx or ramus. Chronic obstruction in small PDA. Continue medical therapy. No angina.  2. Hypertension, well controlled  3. Hypercholesterolemia, on Zocor . LDL 80.   4. Chronic combined systolic/diastolic CHF. Left ventricular dysfunction. Ejection fraction has decreased to 35%. BNP elevated. Cardiac cath showed normal LV filling pressures and cardiac output. Currently on Entresto , aldactone , Farxiga . Back on lasix . No evidence of volume overload. Weight is stable. Metoprolol  discontinued duet to fatigue. continue Entresto  to 97/103 mg bid and  Farxiga  10 mg daily.  Continue low dose aldactone . Repeat Echo recently showed no significant change in EF 35-40%. I would recommend taking lasix  only PRN for swelling or weight gain  5. Atrial fibrillation. Rate is controlled on no medication.  Patient is  asymptomatic. [place] score is 3. Continue Xarelto  20 mg daily.   6. Vertebral compression fracture. Planned  vertebroplasty but fell and fractured hip  7. S/p THR   Follow up in 4 months.

## 2024-09-26 ENCOUNTER — Telehealth: Admitting: Physician Assistant

## 2024-09-26 DIAGNOSIS — R29898 Other symptoms and signs involving the musculoskeletal system: Secondary | ICD-10-CM | POA: Diagnosis not present

## 2024-09-26 NOTE — Progress Notes (Signed)
 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  However, this has improved tremendously according to the patient.  At first he had very little function, but states that he currently is almost back to normal.  He has been following with orthopedics.  He would like to cancel his EMG since he has had so much improvement.  I am in agreement with this plan.  I have offered to the patient and happy to see him in person and if anything were to change to please contact us .  I connected with  [name] on 09/26/24 by a video enabled telemedicine application and verified that I am speaking with the correct person using two identifiers.   I discussed the limitations of evaluation and management by telemedicine. The patient expressed understanding and agreed to proceed.    This visit was performed via telephone.  Patient location: home Provider location: office  I spent a total of 15 minutes non-face-to-face activities for this visit on the date of this encounter including review of current clinical condition and response to treatment.

## 2024-10-03 ENCOUNTER — Encounter: Payer: Self-pay | Admitting: Cardiology

## 2024-10-03 ENCOUNTER — Ambulatory Visit: Attending: Cardiology | Admitting: Cardiology

## 2024-10-03 VITALS — BP 100/64 | HR 77 | Ht 66.5 in | Wt 145.0 lb

## 2024-10-03 DIAGNOSIS — I482 Chronic atrial fibrillation, unspecified: Secondary | ICD-10-CM | POA: Insufficient documentation

## 2024-10-03 DIAGNOSIS — I2510 Atherosclerotic heart disease of native coronary artery without angina pectoris: Secondary | ICD-10-CM | POA: Insufficient documentation

## 2024-10-03 DIAGNOSIS — Z9861 Coronary angioplasty status: Secondary | ICD-10-CM | POA: Insufficient documentation

## 2024-10-03 DIAGNOSIS — I5042 Chronic combined systolic (congestive) and diastolic (congestive) heart failure: Secondary | ICD-10-CM | POA: Insufficient documentation

## 2024-10-03 MED ORDER — FUROSEMIDE 20 MG PO TABS: 20.0000 mg | ORAL_TABLET | Freq: Every day | ORAL | 3 refills | Status: AC | PRN

## 2024-10-03 NOTE — Patient Instructions (Addendum)
 Medication Instructions:   Change lasix  20 mg  to as needed  only take  as need for swelling or edema  *If you need a refill on your cardiac medications before your next appointment, please call your pharmacy*   Lab Work:  Not needed    Testing/Procedures: Not needed   Follow-Up: At [facility], you and your health needs are our priority.  As part of our continuing mission to provide you with exceptional heart care, we have created designated Provider Care Teams.  These Care Teams include your primary Cardiologist (physician) and Advanced Practice Providers (APPs -  Physician Assistants and Nurse Practitioners) who all work together to provide you with the care you need, when you need it.     Your next appointment:   4 month(s)  The format for your next appointment:   In Person  Provider:   Peter [place], MD   Other Instructions

## 2024-11-06 ENCOUNTER — Ambulatory Visit: Attending: Student

## 2024-11-06 DIAGNOSIS — M6281 Muscle weakness (generalized): Secondary | ICD-10-CM | POA: Diagnosis present

## 2024-11-06 DIAGNOSIS — Z96642 Presence of left artificial hip joint: Secondary | ICD-10-CM | POA: Diagnosis present

## 2024-11-06 DIAGNOSIS — R293 Abnormal posture: Secondary | ICD-10-CM | POA: Insufficient documentation

## 2024-11-06 NOTE — Therapy (Signed)
 OUTPATIENT PHYSICAL THERAPY LOWER EXTREMITY EVALUATION   Patient Name: [name] MRN: [id number] DOB:Jul 06, 1933, 89 y.o., male Today's Date: 11/06/2024  END OF SESSION:  PT End of Session - 11/06/24 1751     Visit Number 1    Date for Recertification  01/15/25    Authorization Type Medicare    PT Start Time 1752    PT Stop Time 1835    PT Time Calculation (min) 43 min          Past Medical History:  Diagnosis Date   Atrial fibrillation [facility])    Coronary artery disease    Nov 2009-MI-stenting of the mid LAD drug-eluding stent   Hx of radiation therapy 05/15/14- 07/11/14   prostate 7800 cGy 40 sessions, seminal vesicles 5600 cGy 40 sessions   Hyperlipidemia    Hypertension    LV dysfunction    MI (myocardial infarction) (HCC) 1998   Osteopenia    Prostate CA (HCC) 05/05/2009   prostate cancer 11/09-tx with cryotherapy, Dr Chales   Past Surgical History:  Procedure Laterality Date   CARDIAC CATHETERIZATION     Nov 2009-drug eluding stent to mid LAD   HERNIA REPAIR     inguinal   IR KYPHO EA ADDL LEVEL THORACIC OR LUMBAR  04/28/2023   IR KYPHO LUMBAR INC FX REDUCE BONE BX UNI/BIL CANNULATION INC/IMAGING  04/28/2023   KYPHOPLASTY N/A 09/06/2024   Procedure: KYPHOPLASTY;  Surgeon: Burnetta Aures, MD;  Location: [facility] OR;  Service: Orthopedics;  Laterality: N/A;   PROSTATE BIOPSY  09/25/2008   gleason 4+3=7   PROSTATE CRYOABLATION  05/05/2009   Dr Chales   RIGHT/LEFT HEART CATH AND CORONARY ANGIOGRAPHY N/A 11/02/2023   Procedure: RIGHT/LEFT HEART CATH AND CORONARY ANGIOGRAPHY;  Surgeon: Jordan, Peter M, MD;  Location: [facility] INVASIVE CV LAB;  Service: Cardiovascular;  Laterality: N/A;   TOTAL HIP ARTHROPLASTY N/A 09/07/2024   Procedure: ARTHROPLASTY, HIP, TOTAL, ANTERIOR APPROACH, LEFT;  Surgeon: Kendal Franky SQUIBB, MD;  Location: [facility] OR;  Service: Orthopedics;  Laterality: N/A;  LEFT TOTAL HIP ANTERIOR   Patient Active Problem List   Diagnosis Date Noted   Presence of stent  in LAD coronary artery 09/09/2024   Chronic HFrEF (heart failure with reduced ejection fraction) (HCC) 09/09/2024   Persistent atrial fibrillation (HCC) 09/09/2024   Atherosclerosis of native coronary artery of native heart without angina pectoris 09/09/2024   Preoperative clearance 09/07/2024   Closed left hip fracture (HCC) 09/06/2024   History of myocardial infarction 10/02/2020   Anticoagulated 10/02/2020   Chronic atrial fibrillation (HCC) 11/14/2013   CAD S/P percutaneous coronary angioplasty    Essential hypertension    Ischemic cardiomyopathy    Dyslipidemia, goal LDL below 70    History of prostate cancer 05/05/2009    PCP: Olam Pinal  REFERRING PROVIDER: Lauraine Moores  REFERRING DIAG:  Lt THA 9/12    THERAPY DIAG:  No diagnosis found.  Rationale for Evaluation and Treatment: Rehabilitation  ONSET DATE: 09/07/24  SUBJECTIVE:   SUBJECTIVE STATEMENT: On the way to his kyphoplasty surgery on 09/07/24, pt experienced a fall in the hospital and following that he required the hip surgery. Kypholasty was not done, and scheduled out after hip rehab. Soreness in the left hip but not much pain; still experiencing low level pain in the back.   PERTINENT HISTORY: See above  Hx of back sx- khyphoplasty 2024 and 2025 Total hip 2025  PAIN:  Are you having pain? Yes: NPRS scale: 2/10 L hip; 4/10 LBP  Pain location: L hip;  Pain description: soreness Aggravating factors: Prolonged standing  Relieving factors: Rest  PRECAUTIONS: None  RED FLAGS: None   WEIGHT BEARING RESTRICTIONS: No; WBAT  FALLS:  Has patient fallen in last 6 months? Yes. Number of falls 1; in the hospital   LIVING ENVIRONMENT: Lives with: lives with their family and lives with their daughter Lives in: House/apartment Stairs: Yes: Internal: 6 (garage) steps; can reach both; does not use staircase to the upstairs  Has following equipment at home: Single point cane  OCCUPATION: Retired  PLOF:  Needs assistance with homemaking  PATIENT GOALS: to strengthening the left leg   NEXT MD VISIT:   OBJECTIVE:  Note: Objective measures were completed at Evaluation unless otherwise noted.  DIAGNOSTIC FINDINGS: Status post left hip replacement with expected postsurgical change.  AP pelvis and lateral views of L hip show stable hip arthroplasty. No signs of any subsidence, displacement or loosening.   COGNITION: Overall cognitive status: Within functional limits for tasks assessed     SENSATION: WFL  POSTURE: rounded shoulders, forward head, and increased thoracic kyphosis   LOWER EXTREMITY ROM: full ROM BLE    LOWER EXTREMITY MMT: 4+/5 BLE    LUMBAR ROM Flexion: mid shin, pain in back Extension: 25% with pain Right lateral flexion: mid thigh Left lateral flexion: mid thigh  Right rotation: 75% some pain Left rotation: 75% some pain   FUNCTIONAL TESTS:  5 times sit to stand: 35s Timed up and go (TUG): 16s 370ft   GAIT: Distance walked: in clinic distances Assistive device utilized: Single point cane Level of assistance: Modified independence Comments: decreased hip flexion, some circumduction with LLE                                                                                                                                 TREATMENT DATE: 11/06/24 EVAL    PATIENT EDUCATION:  Education details: POC, HEP Person educated: Patient Education method: Medical Illustrator Education comprehension: verbalized understanding and returned demonstration  HOME EXERCISE PROGRAM: Access Code: [email or web address] URL: [email or web address] Date: 11/06/2024 Prepared by: Almetta Fam  Exercises - Standing Single Leg Stance with Counter Support  - 2 x daily - 7 x weekly - 2 sets - 10 reps - Standing March with Counter Support  - 2 x daily - 7 x weekly - 2 sets - 10 reps - Standing 3-Way Leg Reach with Resistance at Ankles and Counter Support  - 2 x  daily - 7 x weekly - 2 sets - 10 reps - Supine Bridge  - 2 x daily - 7 x weekly - 2 sets - 10 reps - Sit to Stand  - 2 x daily - 7 x weekly - 2 sets - 10 reps  ASSESSMENT:  CLINICAL IMPRESSION: Patient is a 89 y.o. male who was seen today for physical therapy evaluation and treatment for L THA on 09/07/24. He was walking in  the hospital to go in for back surgery and suffered a fall which resulted in a broken hip. Patient received home health PT. He is progressing really well, reports low levels of pain for the hip and is able to walk without [facility] but uses it mostly for safety. Patient has a kyphotic back posture in sitting and standing. Pt exhibits circumduction of LLE during swing phase of gait and a forward trunk lean. Pt will benefit from PT to increase muscular endurance, increase quality of dynamic gait activities, and decrease pain in the back.    OBJECTIVE IMPAIRMENTS: Abnormal gait, decreased activity tolerance, decreased balance, difficulty walking, decreased strength, postural dysfunction, and pain.   ACTIVITY LIMITATIONS: Prolonged standing, pain with trunk rotation  PARTICIPATION LIMITATIONS: assistance with household chores   PERSONAL FACTORS: Age and Time since onset of injury/illness/exacerbation are also affecting patient's functional outcome.   REHAB POTENTIAL: Good  CLINICAL DECISION MAKING: Stable/uncomplicated  EVALUATION COMPLEXITY: Low   GOALS: Goals reviewed with patient? Yes  SHORT TERM GOALS: Target date: 12/11/24  Patient will be independent with initial HEP. Baseline:  Goal status: INITIAL  2.  Patient will complete TUG <12s  Baseline: 16s w/o AD  Goal status: INITIAL   LONG TERM GOALS: Target date: 01/16/24 Patient will be independent with advanced/ongoing HEP to improve outcomes and carryover.  Baseline:  Goal status: INITIAL  2.  Patient will report at least 50-75% improvement in low back and hip pain to improve QOL. Baseline: 2/10 hip, 4/10  back Goal status: INITIAL  3.  Patient will demonstrate improved functional LE strength as demonstrated by 5xSTS <20s. Baseline: 35s from chair height Goal status: INITIAL  4.  Patient will be able to ambulate 500' with normal gait pattern without increased pain to access community.  Baseline:  Goal status: INITIAL  5.  Patient will increase distance to at least 537ft Baseline: 361ft Goal status: INITIAL  6. Patient will be able to tolerate 15 minutes of standing without initiation of back pain in order to progress upright tolerance and endurance  Baseline:  Goal Status: INITAL   PLAN:  PT FREQUENCY: 2x/week  PT DURATION: 10 weeks  PLANNED INTERVENTIONS: 97110-Therapeutic exercises, 97530- Therapeutic activity, 97112- Neuromuscular re-education, 97535- Self Care, [id number]- Manual therapy, [phone]- Gait training, and Patient/Family education  PLAN FOR NEXT SESSION: LE strengthening, balance, gait dynamics   Almetta Fam, PT 11/06/2024, 6:43 PM

## 2024-11-08 ENCOUNTER — Ambulatory Visit

## 2024-11-08 DIAGNOSIS — Z96642 Presence of left artificial hip joint: Secondary | ICD-10-CM | POA: Diagnosis not present

## 2024-11-08 DIAGNOSIS — R293 Abnormal posture: Secondary | ICD-10-CM

## 2024-11-08 DIAGNOSIS — M6281 Muscle weakness (generalized): Secondary | ICD-10-CM

## 2024-11-08 NOTE — Therapy (Signed)
 OUTPATIENT PHYSICAL THERAPY LOWER EXTREMITY TREATMENT   Patient Name: [name] MRN: [id number] DOB:1933/05/31, 89 y.o., male Today's Date: 11/08/2024  END OF SESSION:  PT End of Session - 11/08/24 1044     Visit Number 2    Date for Recertification  01/16/24    Authorization Type Medicare    PT Start Time 1015    PT Stop Time 1055    PT Time Calculation (min) 40 min    Activity Tolerance Patient tolerated treatment well    Behavior During Therapy [facility] for tasks assessed/performed           Past Medical History:  Diagnosis Date   Atrial fibrillation (HCC)    Coronary artery disease    Nov 2009-MI-stenting of the mid LAD drug-eluding stent   Hx of radiation therapy 05/15/14- 07/11/14   prostate 7800 cGy 40 sessions, seminal vesicles 5600 cGy 40 sessions   Hyperlipidemia    Hypertension    LV dysfunction    MI (myocardial infarction) (HCC) 1998   Osteopenia    Prostate CA (HCC) 05/05/2009   prostate cancer 11/09-tx with cryotherapy, Dr Chales   Past Surgical History:  Procedure Laterality Date   CARDIAC CATHETERIZATION     Nov 2009-drug eluding stent to mid LAD   HERNIA REPAIR     inguinal   IR KYPHO EA ADDL LEVEL THORACIC OR LUMBAR  04/28/2023   IR KYPHO LUMBAR INC FX REDUCE BONE BX UNI/BIL CANNULATION INC/IMAGING  04/28/2023   KYPHOPLASTY N/A 09/06/2024   Procedure: KYPHOPLASTY;  Surgeon: Burnetta Aures, MD;  Location: [facility] OR;  Service: Orthopedics;  Laterality: N/A;   PROSTATE BIOPSY  09/25/2008   gleason 4+3=7   PROSTATE CRYOABLATION  05/05/2009   Dr Chales   RIGHT/LEFT HEART CATH AND CORONARY ANGIOGRAPHY N/A 11/02/2023   Procedure: RIGHT/LEFT HEART CATH AND CORONARY ANGIOGRAPHY;  Surgeon: Jordan, Peter M, MD;  Location: [facility] INVASIVE CV LAB;  Service: Cardiovascular;  Laterality: N/A;   TOTAL HIP ARTHROPLASTY N/A 09/07/2024   Procedure: ARTHROPLASTY, HIP, TOTAL, ANTERIOR APPROACH, LEFT;  Surgeon: Kendal Franky SQUIBB, MD;  Location: [facility] OR;  Service: Orthopedics;   Laterality: N/A;  LEFT TOTAL HIP ANTERIOR   Patient Active Problem List   Diagnosis Date Noted   Presence of stent in LAD coronary artery 09/09/2024   Chronic HFrEF (heart failure with reduced ejection fraction) (HCC) 09/09/2024   Persistent atrial fibrillation (HCC) 09/09/2024   Atherosclerosis of native coronary artery of native heart without angina pectoris 09/09/2024   Preoperative clearance 09/07/2024   Closed left hip fracture (HCC) 09/06/2024   History of myocardial infarction 10/02/2020   Anticoagulated 10/02/2020   Chronic atrial fibrillation (HCC) 11/14/2013   CAD S/P percutaneous coronary angioplasty    Essential hypertension    Ischemic cardiomyopathy    Dyslipidemia, goal LDL below 70    History of prostate cancer 05/05/2009    PCP: Olam Pinal  REFERRING PROVIDER: Lauraine Moores  REFERRING DIAG:  Lt THA 9/12    THERAPY DIAG:  S/P total left hip arthroplasty  Muscle weakness (generalized)  Abnormal posture  Rationale for Evaluation and Treatment: Rehabilitation  ONSET DATE: 09/07/24  SUBJECTIVE:   SUBJECTIVE STATEMENT: 11/08/24: No pain today upon arrival of PT; no recent falls since last visit. Pt reported starting HEP since evaluation and feeling good with doing them at home.    On the way to his kyphoplasty surgery on 09/07/24, pt experienced a fall in the hospital and following that he required the hip surgery.  Kypholasty was not done, and scheduled out after hip rehab. Soreness in the left hip but not much pain; still experiencing low level pain in the back.   PERTINENT HISTORY: See above  Hx of back sx- khyphoplasty 2024 and 2025 Total hip 2025  PAIN:  Are you having pain? Yes: NPRS scale: 2/10 L hip; 4/10 LBP Pain location: L hip;  Pain description: soreness Aggravating factors: Prolonged standing  Relieving factors: Rest  PRECAUTIONS: None  RED FLAGS: None   WEIGHT BEARING RESTRICTIONS: No; WBAT  FALLS:  Has patient fallen in last 6  months? Yes. Number of falls 1; in the hospital   LIVING ENVIRONMENT: Lives with: lives with their family and lives with their daughter Lives in: House/apartment Stairs: Yes: Internal: 6 (garage) steps; can reach both; does not use staircase to the upstairs  Has following equipment at home: Single point cane  OCCUPATION: Retired  PLOF: Needs assistance with homemaking  PATIENT GOALS: to strengthening the left leg   NEXT MD VISIT:   OBJECTIVE:  Note: Objective measures were completed at Evaluation unless otherwise noted.  DIAGNOSTIC FINDINGS: Status post left hip replacement with expected postsurgical change.  AP pelvis and lateral views of L hip show stable hip arthroplasty. No signs of any subsidence, displacement or loosening.   COGNITION: Overall cognitive status: Within functional limits for tasks assessed     SENSATION: WFL  POSTURE: rounded shoulders, forward head, and increased thoracic kyphosis   LOWER EXTREMITY ROM: full ROM BLE    LOWER EXTREMITY MMT: 4+/5 BLE    LUMBAR ROM Flexion: mid shin, pain in back Extension: 25% with pain Right lateral flexion: mid thigh Left lateral flexion: mid thigh  Right rotation: 75% some pain Left rotation: 75% some pain   FUNCTIONAL TESTS:  5 times sit to stand: 35s Timed up and go (TUG): 16s 337ft   GAIT: Distance walked: in clinic distances Assistive device utilized: Single point cane Level of assistance: Modified independence Comments: decreased hip flexion, some circumduction with LLE                                                                                                                                 TREATMENT DATE:  11/08/24 STS on airex seat elevated 2x10 Pallof Press 10# 2x10 Toe Taps 6  Standing Hip abduction/ext 2x10 Hamstring [place] resistance band 2x10 Lateral walk foam beam Marches on foam pad  NuStep L5x  11/06/24 EVAL    PATIENT EDUCATION:  Education details:  POC, HEP Person educated: Patient Education method: Medical Illustrator Education comprehension: verbalized understanding and returned demonstration  HOME EXERCISE PROGRAM: Access Code: [email or web address] URL: [email or web address] Date: 11/06/2024 Prepared by: Almetta Fam  Exercises - Standing Single Leg Stance with Counter Support  - 2 x daily - 7 x weekly - 2 sets - 10 reps - Standing March with Counter Support  - 2 x daily - 7 x weekly -  2 sets - 10 reps - Standing 3-Way Leg Reach with Resistance at Ankles and Counter Support  - 2 x daily - 7 x weekly - 2 sets - 10 reps - Supine Bridge  - 2 x daily - 7 x weekly - 2 sets - 10 reps - Sit to Stand  - 2 x daily - 7 x weekly - 2 sets - 10 reps  ASSESSMENT:  CLINICAL IMPRESSION: 11/08/24: Pt exhibits kyphotic posture and discomfort in back with prolonged standing. Requires cues for hip and trunk extension during dynamic activities. Pt requires CGA to minA with SL stance and balance activities. Pt tolerated treatment well with minimal need for rest periods. Pt will benefit from continued PT to address balance and strength deficits to promote fall prevention.   Patient is a 89 y.o. male who was seen today for physical therapy evaluation and treatment for L THA on 09/07/24. He was walking in the hospital to go in for back surgery and suffered a fall which resulted in a broken hip. Patient received home health PT. He is progressing really well, reports low levels of pain for the hip and is able to walk without [facility] but uses it mostly for safety. Patient has a kyphotic back posture in sitting and standing. Pt exhibits circumduction of LLE during swing phase of gait and a forward trunk lean. Pt will benefit from PT to increase muscular endurance, increase quality of dynamic gait activities, and decrease pain in the back.    OBJECTIVE IMPAIRMENTS: Abnormal gait, decreased activity tolerance, decreased balance, difficulty walking,  decreased strength, postural dysfunction, and pain.   ACTIVITY LIMITATIONS: Prolonged standing, pain with trunk rotation  PARTICIPATION LIMITATIONS: assistance with household chores   PERSONAL FACTORS: Age and Time since onset of injury/illness/exacerbation are also affecting patient's functional outcome.   REHAB POTENTIAL: Good  CLINICAL DECISION MAKING: Stable/uncomplicated  EVALUATION COMPLEXITY: Low   GOALS: Goals reviewed with patient? Yes  SHORT TERM GOALS: Target date: 12/11/24  Patient will be independent with initial HEP. Baseline:  Goal status: IN PROGRESS more than half of HEP independently 11/08/24  2.  Patient will complete TUG <12s  Baseline: 16s w/o AD  Goal status: INITIAL   LONG TERM GOALS: Target date: 01/16/24 Patient will be independent with advanced/ongoing HEP to improve outcomes and carryover.  Baseline:  Goal status: INITIAL  2.  Patient will report at least 50-75% improvement in low back and hip pain to improve QOL. Baseline: 2/10 hip, 4/10 back Goal status: INITIAL  3.  Patient will demonstrate improved functional LE strength as demonstrated by 5xSTS <20s. Baseline: 35s from chair height Goal status: INITIAL  4.  Patient will be able to ambulate 500' with normal gait pattern without increased pain to access community.  Baseline:  Goal status: INITIAL  5.  Patient will increase distance to at least 531ft Baseline: 319ft Goal status: INITIAL  6. Patient will be able to tolerate 15 minutes of standing without initiation of back pain in order to progress upright tolerance and endurance  Baseline:  Goal Status: INITAL   PLAN:  PT FREQUENCY: 2x/week  PT DURATION: 10 weeks  PLANNED INTERVENTIONS: 97110-Therapeutic exercises, 97530- Therapeutic activity, 97112- Neuromuscular re-education, 97535- Self Care, 02859- Manual therapy, [phone]- Gait training, and Patient/Family education  PLAN FOR NEXT SESSION: LE strengthening, balance, gait  dynamics   Thersia Alder, Student-PT 11/08/2024, 10:47 AM

## 2024-11-12 ENCOUNTER — Encounter: Payer: Self-pay | Admitting: Physical Therapy

## 2024-11-12 ENCOUNTER — Ambulatory Visit: Admitting: Physical Therapy

## 2024-11-12 DIAGNOSIS — Z96642 Presence of left artificial hip joint: Secondary | ICD-10-CM | POA: Diagnosis not present

## 2024-11-12 DIAGNOSIS — M6281 Muscle weakness (generalized): Secondary | ICD-10-CM

## 2024-11-12 DIAGNOSIS — R293 Abnormal posture: Secondary | ICD-10-CM

## 2024-11-12 NOTE — Therapy (Signed)
 OUTPATIENT PHYSICAL THERAPY LOWER EXTREMITY TREATMENT   Patient Name: [name] MRN: [id number] DOB:[birth year], 89 y.o., male Today's Date: 11/12/2024  END OF SESSION:  PT End of Session - 11/12/24 1019     Visit Number 3    Date for Recertification  01/16/24    PT Start Time 1015    PT Stop Time 1100    PT Time Calculation (min) 45 min    Activity Tolerance Patient tolerated treatment well    Behavior During Therapy [facility] for tasks assessed/performed           Past Medical History:  Diagnosis Date   Atrial fibrillation (HCC)    Coronary artery disease    Nov 2009-MI-stenting of the mid LAD drug-eluding stent   Hx of radiation therapy 05/15/14- 07/11/14   prostate 7800 cGy 40 sessions, seminal vesicles 5600 cGy 40 sessions   Hyperlipidemia    Hypertension    LV dysfunction    MI (myocardial infarction) (HCC) 1998   Osteopenia    Prostate CA (HCC) 05/05/2009   prostate cancer 11/09-tx with cryotherapy, Dr Chales   Past Surgical History:  Procedure Laterality Date   CARDIAC CATHETERIZATION     Nov 2009-drug eluding stent to mid LAD   HERNIA REPAIR     inguinal   IR KYPHO EA ADDL LEVEL THORACIC OR LUMBAR  04/28/2023   IR KYPHO LUMBAR INC FX REDUCE BONE BX UNI/BIL CANNULATION INC/IMAGING  04/28/2023   KYPHOPLASTY N/A 09/06/2024   Procedure: KYPHOPLASTY;  Surgeon: Burnetta Aures, MD;  Location: [facility] OR;  Service: Orthopedics;  Laterality: N/A;   PROSTATE BIOPSY  09/25/2008   gleason 4+3=7   PROSTATE CRYOABLATION  05/05/2009   Dr Chales   RIGHT/LEFT HEART CATH AND CORONARY ANGIOGRAPHY N/A 11/02/2023   Procedure: RIGHT/LEFT HEART CATH AND CORONARY ANGIOGRAPHY;  Surgeon: Jordan, Peter M, MD;  Location: [facility] INVASIVE CV LAB;  Service: Cardiovascular;  Laterality: N/A;   TOTAL HIP ARTHROPLASTY N/A 09/07/2024   Procedure: ARTHROPLASTY, HIP, TOTAL, ANTERIOR APPROACH, LEFT;  Surgeon: Kendal Franky SQUIBB, MD;  Location: [facility] OR;  Service: Orthopedics;  Laterality: N/A;  LEFT TOTAL HIP  ANTERIOR   Patient Active Problem List   Diagnosis Date Noted   Presence of stent in LAD coronary artery 09/09/2024   Chronic HFrEF (heart failure with reduced ejection fraction) (HCC) 09/09/2024   Persistent atrial fibrillation (HCC) 09/09/2024   Atherosclerosis of native coronary artery of native heart without angina pectoris 09/09/2024   Preoperative clearance 09/07/2024   Closed left hip fracture (HCC) 09/06/2024   History of myocardial infarction 10/02/2020   Anticoagulated 10/02/2020   Chronic atrial fibrillation (HCC) 11/14/2013   CAD S/P percutaneous coronary angioplasty    Essential hypertension    Ischemic cardiomyopathy    Dyslipidemia, goal LDL below 70    History of prostate cancer 05/05/2009    PCP: Olam Pinal  REFERRING PROVIDER: Lauraine Moores  REFERRING DIAG:  Lt THA 9/12    THERAPY DIAG:  S/P total left hip arthroplasty  Muscle weakness (generalized)  Abnormal posture  Rationale for Evaluation and Treatment: Rehabilitation  ONSET DATE: 09/07/24  SUBJECTIVE:   SUBJECTIVE STATEMENT: Doing pretty good, ready to get off this cane   On the way to his kyphoplasty surgery on 09/07/24, pt experienced a fall in the hospital and following that he required the hip surgery. Kypholasty was not done, and scheduled out after hip rehab. Soreness in the left hip but not much pain; still experiencing low level pain in the  back.   PERTINENT HISTORY: See above  Hx of back sx- khyphoplasty 2024 and 2025 Total hip 2025  PAIN:  Are you having pain? Yes: NPRS scale: 0/10 L hip; 2/10 LBP Pain location: L hip;  Pain description: soreness Aggravating factors: Prolonged standing  Relieving factors: Rest  PRECAUTIONS: None  RED FLAGS: None   WEIGHT BEARING RESTRICTIONS: No; WBAT  FALLS:  Has patient fallen in last 6 months? Yes. Number of falls 1; in the hospital   LIVING ENVIRONMENT: Lives with: lives with their family and lives with their daughter Lives in:  House/apartment Stairs: Yes: Internal: 6 (garage) steps; can reach both; does not use staircase to the upstairs  Has following equipment at home: Single point cane  OCCUPATION: Retired  PLOF: Needs assistance with homemaking  PATIENT GOALS: to strengthening the left leg   NEXT MD VISIT:   OBJECTIVE:  Note: Objective measures were completed at Evaluation unless otherwise noted.  DIAGNOSTIC FINDINGS: Status post left hip replacement with expected postsurgical change.  AP pelvis and lateral views of L hip show stable hip arthroplasty. No signs of any subsidence, displacement or loosening.   COGNITION: Overall cognitive status: Within functional limits for tasks assessed     SENSATION: WFL  POSTURE: rounded shoulders, forward head, and increased thoracic kyphosis   LOWER EXTREMITY ROM: full ROM BLE    LOWER EXTREMITY MMT: 4+/5 BLE    LUMBAR ROM Flexion: mid shin, pain in back Extension: 25% with pain Right lateral flexion: mid thigh Left lateral flexion: mid thigh  Right rotation: 75% some pain Left rotation: 75% some pain   FUNCTIONAL TESTS:  5 times sit to stand: 35s Timed up and go (TUG): 16s 322ft   GAIT: Distance walked: in clinic distances Assistive device utilized: Single point cane Level of assistance: Modified independence Comments: decreased hip flexion, some circumduction with LLE                                                                                                                                 TREATMENT DATE:  1117/25 NuStep L 5 x 6 min Sit to stands 2x10 from elevated mat HS curls 25lb 2x10 Leg Ext 2x10 Bridges x10 SLR LLE x10, 2lb x10 Sideling hip abd LLE x10, 2lb x10 Standing march 2lb 2x10 1 hand for support Hip add ball squeeze 2x10 4in step ups x10 each  6inn x5 each   11/08/24 STS on airex seat elevated 2x10 Pallof Press 10# 2x10 Toe Taps 6  Standing Hip abduction/ext 2x10 Hamstring Curls Green resistance band  2x10 Lateral walk foam beam Marches on foam pad  NuStep L5x  11/06/24 EVAL    PATIENT EDUCATION:  Education details: POC, HEP Person educated: Patient Education method: Medical Illustrator Education comprehension: verbalized understanding and returned demonstration  HOME EXERCISE PROGRAM: Access Code: [email or web address] URL: [email or web address] Date: 11/06/2024 Prepared by: Almetta Fam  Exercises - Standing Single Leg Stance with  Counter Support  - 2 x daily - 7 x weekly - 2 sets - 10 reps - Standing March with Counter Support  - 2 x daily - 7 x weekly - 2 sets - 10 reps - Standing 3-Way Leg Reach with Resistance at Ankles and Counter Support  - 2 x daily - 7 x weekly - 2 sets - 10 reps - Supine Bridge  - 2 x daily - 7 x weekly - 2 sets - 10 reps - Sit to Stand  - 2 x daily - 7 x weekly - 2 sets - 10 reps  ASSESSMENT:  CLINICAL IMPRESSION: 11/12/24: Pt has  kyphotic posture and discomfort in back with prolonged standing. Femurs are long in comparison to his torso make sit to stands more difficult. Postural cue need with standing intervention. L weakness noted with hip isolated interventions.  Pt tolerated treatment well with minimal need for rest periods. Pt will benefit from continued PT to address balance and strength deficits to promote fall prevention.   Patient is a 89 y.o. male who was seen today for physical therapy evaluation and treatment for L THA on 09/07/24. He was walking in the hospital to go in for back surgery and suffered a fall which resulted in a broken hip. Patient received home health PT. He is progressing really well, reports low levels of pain for the hip and is able to walk without [facility] but uses it mostly for safety. Patient has a kyphotic back posture in sitting and standing. Pt exhibits circumduction of LLE during swing phase of gait and a forward trunk lean. Pt will benefit from PT to increase muscular endurance, increase quality of  dynamic gait activities, and decrease pain in the back.    OBJECTIVE IMPAIRMENTS: Abnormal gait, decreased activity tolerance, decreased balance, difficulty walking, decreased strength, postural dysfunction, and pain.   ACTIVITY LIMITATIONS: Prolonged standing, pain with trunk rotation  PARTICIPATION LIMITATIONS: assistance with household chores   PERSONAL FACTORS: Age and Time since onset of injury/illness/exacerbation are also affecting patient's functional outcome.   REHAB POTENTIAL: Good  CLINICAL DECISION MAKING: Stable/uncomplicated  EVALUATION COMPLEXITY: Low   GOALS: Goals reviewed with patient? Yes  SHORT TERM GOALS: Target date: 12/11/24  Patient will be independent with initial HEP. Baseline:  Goal status: IN PROGRESS more than half of HEP independently 11/08/24  2.  Patient will complete TUG <12s  Baseline: 16s w/o AD  Goal status: INITIAL   LONG TERM GOALS: Target date: 01/16/24 Patient will be independent with advanced/ongoing HEP to improve outcomes and carryover.  Baseline:  Goal status: INITIAL  2.  Patient will report at least 50-75% improvement in low back and hip pain to improve QOL. Baseline: 2/10 hip, 4/10 back Goal status: INITIAL  3.  Patient will demonstrate improved functional LE strength as demonstrated by 5xSTS <20s. Baseline: 35s from chair height Goal status: INITIAL  4.  Patient will be able to ambulate 500' with normal gait pattern without increased pain to access community.  Baseline:  Goal status: INITIAL  5.  Patient will increase distance to at least 575ft Baseline: 377ft Goal status: INITIAL  6. Patient will be able to tolerate 15 minutes of standing without initiation of back pain in order to progress upright tolerance and endurance  Baseline:  Goal Status: INITAL   PLAN:  PT FREQUENCY: 2x/week  PT DURATION: 10 weeks  PLANNED INTERVENTIONS: 97110-Therapeutic exercises, 97530- Therapeutic activity, [id number]-  Neuromuscular re-education, 97535- Self Care, [id number]- Manual therapy, [id number]- Gait  training, and Patient/Family education  PLAN FOR NEXT SESSION: LE strengthening, balance, gait dynamics   Tanda KANDICE Sorrow, PTA 11/12/2024, 10:19 AM

## 2024-11-16 ENCOUNTER — Encounter: Payer: Self-pay | Admitting: Physical Therapy

## 2024-11-16 ENCOUNTER — Ambulatory Visit: Admitting: Physical Therapy

## 2024-11-16 DIAGNOSIS — M6281 Muscle weakness (generalized): Secondary | ICD-10-CM

## 2024-11-16 DIAGNOSIS — R293 Abnormal posture: Secondary | ICD-10-CM

## 2024-11-16 DIAGNOSIS — Z96642 Presence of left artificial hip joint: Secondary | ICD-10-CM

## 2024-11-16 NOTE — Therapy (Signed)
 OUTPATIENT PHYSICAL THERAPY LOWER EXTREMITY TREATMENT   Patient Name: [name] MRN: [id number] DOB:January 04, 1933, 89 y.o., male Today's Date: 11/16/2024  END OF SESSION:  PT End of Session - 11/16/24 1101     Visit Number 4    Date for Recertification  01/16/24    PT Start Time 1101    PT Stop Time 1145    PT Time Calculation (min) 44 min    Activity Tolerance Patient tolerated treatment well    Behavior During Therapy [facility] for tasks assessed/performed           Past Medical History:  Diagnosis Date   Atrial fibrillation (HCC)    Coronary artery disease    Nov 2009-MI-stenting of the mid LAD drug-eluding stent   Hx of radiation therapy 05/15/14- 07/11/14   prostate 7800 cGy 40 sessions, seminal vesicles 5600 cGy 40 sessions   Hyperlipidemia    Hypertension    LV dysfunction    MI (myocardial infarction) (HCC) 1998   Osteopenia    Prostate CA (HCC) 05/05/2009   prostate cancer 11/09-tx with cryotherapy, Dr Chales   Past Surgical History:  Procedure Laterality Date   CARDIAC CATHETERIZATION     Nov 2009-drug eluding stent to mid LAD   HERNIA REPAIR     inguinal   IR KYPHO EA ADDL LEVEL THORACIC OR LUMBAR  04/28/2023   IR KYPHO LUMBAR INC FX REDUCE BONE BX UNI/BIL CANNULATION INC/IMAGING  04/28/2023   KYPHOPLASTY N/A 09/06/2024   Procedure: KYPHOPLASTY;  Surgeon: Burnetta Aures, MD;  Location: [facility] OR;  Service: Orthopedics;  Laterality: N/A;   PROSTATE BIOPSY  09/25/2008   gleason 4+3=7   PROSTATE CRYOABLATION  05/05/2009   Dr Chales   RIGHT/LEFT HEART CATH AND CORONARY ANGIOGRAPHY N/A 11/02/2023   Procedure: RIGHT/LEFT HEART CATH AND CORONARY ANGIOGRAPHY;  Surgeon: Jordan, Peter M, MD;  Location: [facility] INVASIVE CV LAB;  Service: Cardiovascular;  Laterality: N/A;   TOTAL HIP ARTHROPLASTY N/A 09/07/2024   Procedure: ARTHROPLASTY, HIP, TOTAL, ANTERIOR APPROACH, LEFT;  Surgeon: Kendal Franky SQUIBB, MD;  Location: [facility] OR;  Service: Orthopedics;  Laterality: N/A;  LEFT TOTAL HIP  ANTERIOR   Patient Active Problem List   Diagnosis Date Noted   Presence of stent in LAD coronary artery 09/09/2024   Chronic HFrEF (heart failure with reduced ejection fraction) (HCC) 09/09/2024   Persistent atrial fibrillation (HCC) 09/09/2024   Atherosclerosis of native coronary artery of native heart without angina pectoris 09/09/2024   Preoperative clearance 09/07/2024   Closed left hip fracture (HCC) 09/06/2024   History of myocardial infarction 10/02/2020   Anticoagulated 10/02/2020   Chronic atrial fibrillation (HCC) 11/14/2013   CAD S/P percutaneous coronary angioplasty    Essential hypertension    Ischemic cardiomyopathy    Dyslipidemia, goal LDL below 70    History of prostate cancer 05/05/2009    PCP: Olam Pinal  REFERRING PROVIDER: Lauraine Moores  REFERRING DIAG:  Lt THA 9/12    THERAPY DIAG:  S/P total left hip arthroplasty  Muscle weakness (generalized)  Abnormal posture  Rationale for Evaluation and Treatment: Rehabilitation  ONSET DATE: 09/07/24  SUBJECTIVE:   SUBJECTIVE STATEMENT: Some DOMS after last session, doing good today   On the way to his kyphoplasty surgery on 09/07/24, pt experienced a fall in the hospital and following that he required the hip surgery. Kypholasty was not done, and scheduled out after hip rehab. Soreness in the left hip but not much pain; still experiencing low level pain in the back.  PERTINENT HISTORY: See above  Hx of back sx- khyphoplasty 2024 and 2025 Total hip 2025  PAIN:  Are you having pain? Yes: NPRS scale: 0/10 L hip; 2/10 LBP Pain location: L hip;  Pain description: soreness Aggravating factors: Prolonged standing  Relieving factors: Rest  PRECAUTIONS: None  RED FLAGS: None   WEIGHT BEARING RESTRICTIONS: No; WBAT  FALLS:  Has patient fallen in last 6 months? Yes. Number of falls 1; in the hospital   LIVING ENVIRONMENT: Lives with: lives with their family and lives with their daughter Lives  in: House/apartment Stairs: Yes: Internal: 6 (garage) steps; can reach both; does not use staircase to the upstairs  Has following equipment at home: Single point cane  OCCUPATION: Retired  PLOF: Needs assistance with homemaking  PATIENT GOALS: to strengthening the left leg   NEXT MD VISIT:   OBJECTIVE:  Note: Objective measures were completed at Evaluation unless otherwise noted.  DIAGNOSTIC FINDINGS: Status post left hip replacement with expected postsurgical change.  AP pelvis and lateral views of L hip show stable hip arthroplasty. No signs of any subsidence, displacement or loosening.   COGNITION: Overall cognitive status: Within functional limits for tasks assessed     SENSATION: WFL  POSTURE: rounded shoulders, forward head, and increased thoracic kyphosis   LOWER EXTREMITY ROM: full ROM BLE    LOWER EXTREMITY MMT: 4+/5 BLE    LUMBAR ROM Flexion: mid shin, pain in back Extension: 25% with pain Right lateral flexion: mid thigh Left lateral flexion: mid thigh  Right rotation: 75% some pain Left rotation: 75% some pain   FUNCTIONAL TESTS:  5 times sit to stand: 35s Timed up and go (TUG): 16s 373ft   GAIT: Distance walked: in clinic distances Assistive device utilized: Single point cane Level of assistance: Modified independence Comments: decreased hip flexion, some circumduction with LLE                                                                                                                                 TREATMENT DATE:  11/16/24 NuStep L 5 x 6 min Goals   TUG 10.93  5X S2S  11.67 Leg press 30lb 2x10 HS curls 25lb 2x12 Leg Ext 2x12 6in step ups x10, min assit with LLE 20l resisted side steps Lateral 4in step ups 1 rails  1117/25 NuStep L 5 x 6 min Sit to stands 2x10 from elevated mat HS curls 25lb 2x10 Leg Ext 2x10 Bridges x10 SLR LLE x10, 2lb x10 Sideling hip abd LLE x10, 2lb x10 Standing march 2lb 2x10 1 hand for  support Hip add ball squeeze 2x10 4in step ups x10 each  6inn x5 each   11/08/24 STS on airex seat elevated 2x10 Pallof Press 10# 2x10 Toe Taps 6  Standing Hip abduction/ext 2x10 Hamstring Curls Green resistance band 2x10 Lateral walk foam beam Marches on foam pad  NuStep L5x  11/06/24 EVAL    PATIENT  EDUCATION:  Education details: POC, HEP Person educated: Patient Education method: Medical Illustrator Education comprehension: verbalized understanding and returned demonstration  HOME EXERCISE PROGRAM: Access Code: [email or web address] URL: [email or web address] Date: 11/06/2024 Prepared by: Almetta Fam  Exercises - Standing Single Leg Stance with Counter Support  - 2 x daily - 7 x weekly - 2 sets - 10 reps - Standing March with Counter Support  - 2 x daily - 7 x weekly - 2 sets - 10 reps - Standing 3-Way Leg Reach with Resistance at Ankles and Counter Support  - 2 x daily - 7 x weekly - 2 sets - 10 reps - Supine Bridge  - 2 x daily - 7 x weekly - 2 sets - 10 reps - Sit to Stand  - 2 x daily - 7 x weekly - 2 sets - 10 reps  ASSESSMENT:  CLINICAL IMPRESSION: 11/16/24: Pt has kyphotic posture and discomfort in back with prolonged standing. Pt has progressed meeting both TUG and 5x S2S goal. Session focused more on functional interventions.  LLE weakness noted with forward step ups and resisted side steps. CGA required with resisted side steps due to instability and weakness. Pt will benefit from continued PT to address balance and strength deficits to promote fall prevention.   Patient is a 89 y.o. male who was seen today for physical therapy evaluation and treatment for L THA on 09/07/24. He was walking in the hospital to go in for back surgery and suffered a fall which resulted in a broken hip. Patient received home health PT. He is progressing really well, reports low levels of pain for the hip and is able to walk without [facility] but uses it mostly for safety.  Patient has a kyphotic back posture in sitting and standing. Pt exhibits circumduction of LLE during swing phase of gait and a forward trunk lean. Pt will benefit from PT to increase muscular endurance, increase quality of dynamic gait activities, and decrease pain in the back.    OBJECTIVE IMPAIRMENTS: Abnormal gait, decreased activity tolerance, decreased balance, difficulty walking, decreased strength, postural dysfunction, and pain.   ACTIVITY LIMITATIONS: Prolonged standing, pain with trunk rotation  PARTICIPATION LIMITATIONS: assistance with household chores   PERSONAL FACTORS: Age and Time since onset of injury/illness/exacerbation are also affecting patient's functional outcome.   REHAB POTENTIAL: Good  CLINICAL DECISION MAKING: Stable/uncomplicated  EVALUATION COMPLEXITY: Low   GOALS: Goals reviewed with patient? Yes  SHORT TERM GOALS: Target date: 12/11/24  Patient will be independent with initial HEP. Baseline:  Goal status: IN PROGRESS more than half of HEP independently 11/08/24, Met 11/16/24  2.  Patient will complete TUG <12s  Baseline: 16s w/o AD  Goal status: 10.93 w/o AD Met 11/16/24   LONG TERM GOALS: Target date: 01/16/24 Patient will be independent with advanced/ongoing HEP to improve outcomes and carryover.  Baseline:  Goal status: INITIAL  2.  Patient will report at least 50-75% improvement in low back and hip pain to improve QOL. Baseline: 2/10 hip, 4/10 back Goal status: INITIAL  3.  Patient will demonstrate improved functional LE strength as demonstrated by 5xSTS <20s. Baseline: 35s from chair height Goal status: Met 11.67 11/16/24  4.  Patient will be able to ambulate 500' with normal gait pattern without increased pain to access community.  Baseline:  Goal status: INITIAL  5.  Patient will increase distance to at least 532ft Baseline: 372ft Goal status: INITIAL  6. Patient will be able to tolerate 15 minutes of  standing without  initiation of back pain in order to progress upright tolerance and endurance  Baseline:  Goal Status: INITAL   PLAN:  PT FREQUENCY: 2x/week  PT DURATION: 10 weeks  PLANNED INTERVENTIONS: 97110-Therapeutic exercises, 97530- Therapeutic activity, 97112- Neuromuscular re-education, 97535- Self Care, 02859- Manual therapy, [phone]- Gait training, and Patient/Family education  PLAN FOR NEXT SESSION: LE strengthening, balance, gait dynamics   Tanda KANDICE Sorrow, PTA 11/16/2024, 11:02 AM

## 2024-11-20 ENCOUNTER — Ambulatory Visit

## 2024-11-20 DIAGNOSIS — Z96642 Presence of left artificial hip joint: Secondary | ICD-10-CM

## 2024-11-20 DIAGNOSIS — R293 Abnormal posture: Secondary | ICD-10-CM

## 2024-11-20 DIAGNOSIS — M6281 Muscle weakness (generalized): Secondary | ICD-10-CM

## 2024-11-20 NOTE — Therapy (Signed)
 OUTPATIENT PHYSICAL THERAPY LOWER EXTREMITY TREATMENT   Patient Name: [name] MRN: [id number] DOB:1932/12/28, 89 y.o., male Today's Date: 11/20/2024  END OF SESSION:  PT End of Session - 11/20/24 1110     Visit Number 5    Date for Recertification  01/16/24    PT Start Time 1100    PT Stop Time 1145    PT Time Calculation (min) 45 min    Activity Tolerance Patient tolerated treatment well    Behavior During Therapy [facility] for tasks assessed/performed            Past Medical History:  Diagnosis Date   Atrial fibrillation (HCC)    Coronary artery disease    Nov 2009-MI-stenting of the mid LAD drug-eluding stent   Hx of radiation therapy 05/15/14- 07/11/14   prostate 7800 cGy 40 sessions, seminal vesicles 5600 cGy 40 sessions   Hyperlipidemia    Hypertension    LV dysfunction    MI (myocardial infarction) (HCC) 1998   Osteopenia    Prostate CA (HCC) 05/05/2009   prostate cancer 11/09-tx with cryotherapy, Dr Chales   Past Surgical History:  Procedure Laterality Date   CARDIAC CATHETERIZATION     Nov 2009-drug eluding stent to mid LAD   HERNIA REPAIR     inguinal   IR KYPHO EA ADDL LEVEL THORACIC OR LUMBAR  04/28/2023   IR KYPHO LUMBAR INC FX REDUCE BONE BX UNI/BIL CANNULATION INC/IMAGING  04/28/2023   KYPHOPLASTY N/A 09/06/2024   Procedure: KYPHOPLASTY;  Surgeon: Burnetta Aures, MD;  Location: [facility] OR;  Service: Orthopedics;  Laterality: N/A;   PROSTATE BIOPSY  09/25/2008   gleason 4+3=7   PROSTATE CRYOABLATION  05/05/2009   Dr Chales   RIGHT/LEFT HEART CATH AND CORONARY ANGIOGRAPHY N/A 11/02/2023   Procedure: RIGHT/LEFT HEART CATH AND CORONARY ANGIOGRAPHY;  Surgeon: Jordan, Peter M, MD;  Location: [facility] INVASIVE CV LAB;  Service: Cardiovascular;  Laterality: N/A;   TOTAL HIP ARTHROPLASTY N/A 09/07/2024   Procedure: ARTHROPLASTY, HIP, TOTAL, ANTERIOR APPROACH, LEFT;  Surgeon: Kendal Franky SQUIBB, MD;  Location: [facility] OR;  Service: Orthopedics;  Laterality: N/A;  LEFT TOTAL  HIP ANTERIOR   Patient Active Problem List   Diagnosis Date Noted   Presence of stent in LAD coronary artery 09/09/2024   Chronic HFrEF (heart failure with reduced ejection fraction) (HCC) 09/09/2024   Persistent atrial fibrillation (HCC) 09/09/2024   Atherosclerosis of native coronary artery of native heart without angina pectoris 09/09/2024   Preoperative clearance 09/07/2024   Closed left hip fracture (HCC) 09/06/2024   History of myocardial infarction 10/02/2020   Anticoagulated 10/02/2020   Chronic atrial fibrillation (HCC) 11/14/2013   CAD S/P percutaneous coronary angioplasty    Essential hypertension    Ischemic cardiomyopathy    Dyslipidemia, goal LDL below 70    History of prostate cancer 05/05/2009    PCP: Olam Pinal  REFERRING PROVIDER: Lauraine Moores  REFERRING DIAG:  Lt THA 9/12    THERAPY DIAG:  S/P total left hip arthroplasty  Muscle weakness (generalized)  Abnormal posture  Rationale for Evaluation and Treatment: Rehabilitation  ONSET DATE: 09/07/24  SUBJECTIVE:   SUBJECTIVE STATEMENT: Some DOMS following last session BLE soreness/pain was 6/10; today the pain is lower level at 3/10.    On the way to his kyphoplasty surgery on 09/07/24, pt experienced a fall in the hospital and following that he required the hip surgery. Kypholasty was not done, and scheduled out after hip rehab. Soreness in the left hip but  not much pain; still experiencing low level pain in the back.   PERTINENT HISTORY: See above  Hx of back sx- khyphoplasty 2024 and 2025 Total hip 2025  PAIN:  Are you having pain? Yes: NPRS scale: 0/10 L hip; 2/10 LBP Pain location: L hip;  Pain description: soreness Aggravating factors: Prolonged standing  Relieving factors: Rest  PRECAUTIONS: None  RED FLAGS: None   WEIGHT BEARING RESTRICTIONS: No; WBAT  FALLS:  Has patient fallen in last 6 months? Yes. Number of falls 1; in the hospital   LIVING ENVIRONMENT: Lives with: lives  with their family and lives with their daughter Lives in: House/apartment Stairs: Yes: Internal: 6 (garage) steps; can reach both; does not use staircase to the upstairs  Has following equipment at home: Single point cane  OCCUPATION: Retired  PLOF: Needs assistance with homemaking  PATIENT GOALS: to strengthening the left leg   NEXT MD VISIT:   OBJECTIVE:  Note: Objective measures were completed at Evaluation unless otherwise noted.  DIAGNOSTIC FINDINGS: Status post left hip replacement with expected postsurgical change.  AP pelvis and lateral views of L hip show stable hip arthroplasty. No signs of any subsidence, displacement or loosening.   COGNITION: Overall cognitive status: Within functional limits for tasks assessed     SENSATION: WFL  POSTURE: rounded shoulders, forward head, and increased thoracic kyphosis   LOWER EXTREMITY ROM: full ROM BLE    LOWER EXTREMITY MMT: 4+/5 BLE    LUMBAR ROM Flexion: mid shin, pain in back Extension: 25% with pain Right lateral flexion: mid thigh Left lateral flexion: mid thigh  Right rotation: 75% some pain Left rotation: 75% some pain   FUNCTIONAL TESTS:  5 times sit to stand: 35s Timed up and go (TUG): 16s 370ft   GAIT: Distance walked: in clinic distances Assistive device utilized: Single point cane Level of assistance: Modified independence Comments: decreased hip flexion, some circumduction with LLE                                                                                                                                 TREATMENT DATE:  11/20/24 Reasses Hip 3 way (no resistance) 2x10 Seated marches 2 1/2# 2x20 Seated hamstring curls green resistance band 2x10 Lateral step ups/overs on airex  [place] taps on airex  Airex balance (feet together, eyes closed, half tandem, tandem) STS on airex 2x10   11/16/24 NuStep L 5 x 6 min Goals   TUG 10.93  5X S2S  11.67 Leg press 30lb 2x10 HS  curls 25lb 2x12 Leg Ext 2x12 6in step ups x10, min assit with LLE 20l resisted side steps Lateral 4in step ups 1 rails  1117/25 NuStep L 5 x 6 min Sit to stands 2x10 from elevated mat HS curls 25lb 2x10 Leg Ext 2x10 Bridges x10 SLR LLE x10, 2lb x10 Sideling hip abd LLE x10, 2lb x10 Standing march 2lb 2x10 1 hand for  support Hip add ball squeeze 2x10 4in step ups x10 each  6inn x5 each   11/08/24 STS on airex seat elevated 2x10 Pallof Press 10# 2x10 Toe Taps 6  Standing Hip abduction/ext 2x10 Hamstring Curls Green resistance band 2x10 Lateral walk foam beam Marches on foam pad  NuStep L5x  11/06/24 EVAL    PATIENT EDUCATION:  Education details: POC, HEP Person educated: Patient Education method: Medical Illustrator Education comprehension: verbalized understanding and returned demonstration  HOME EXERCISE PROGRAM: Access Code: [email or web address] URL: [email or web address] Date: 11/06/2024 Prepared by: Almetta Fam  Exercises - Standing Single Leg Stance with Counter Support  - 2 x daily - 7 x weekly - 2 sets - 10 reps - Standing March with Counter Support  - 2 x daily - 7 x weekly - 2 sets - 10 reps - Standing 3-Way Leg Reach with Resistance at Ankles and Counter Support  - 2 x daily - 7 x weekly - 2 sets - 10 reps - Supine Bridge  - 2 x daily - 7 x weekly - 2 sets - 10 reps - Sit to Stand  - 2 x daily - 7 x weekly - 2 sets - 10 reps  ASSESSMENT:  CLINICAL IMPRESSION: Pt exhibits some soreness in the BLE today exercises were performed at a lower intensity to promote pain reduction and recovery. Session focused on balance and light strengthening today. Pt requires CGA for dynamic balance activities.   Patient is a 89 y.o. male who was seen today for physical therapy evaluation and treatment for L THA on 09/07/24. He was walking in the hospital to go in for back surgery and suffered a fall which resulted in a broken hip. Patient received home  health PT. He is progressing really well, reports low levels of pain for the hip and is able to walk without [facility] but uses it mostly for safety. Patient has a kyphotic back posture in sitting and standing. Pt exhibits circumduction of LLE during swing phase of gait and a forward trunk lean. Pt will benefit from PT to increase muscular endurance, increase quality of dynamic gait activities, and decrease pain in the back.    OBJECTIVE IMPAIRMENTS: Abnormal gait, decreased activity tolerance, decreased balance, difficulty walking, decreased strength, postural dysfunction, and pain.   ACTIVITY LIMITATIONS: Prolonged standing, pain with trunk rotation  PARTICIPATION LIMITATIONS: assistance with household chores   PERSONAL FACTORS: Age and Time since onset of injury/illness/exacerbation are also affecting patient's functional outcome.   REHAB POTENTIAL: Good  CLINICAL DECISION MAKING: Stable/uncomplicated  EVALUATION COMPLEXITY: Low   GOALS: Goals reviewed with patient? Yes  SHORT TERM GOALS: Target date: 12/11/24  Patient will be independent with initial HEP. Baseline:  Goal status: IN PROGRESS more than half of HEP independently 11/08/24, Met 11/16/24  2.  Patient will complete TUG <12s  Baseline: 16s w/o AD  Goal status: 10.93 w/o AD Met 11/16/24   LONG TERM GOALS: Target date: 01/16/24 Patient will be independent with advanced/ongoing HEP to improve outcomes and carryover.  Baseline:  Goal status: MET 11/20/24  2.  Patient will report at least 50-75% improvement in low back and hip pain to improve QOL. Baseline: 2/10 hip, 4/10 back Goal status: INITIAL  3.  Patient will demonstrate improved functional LE strength as demonstrated by 5xSTS <20s. Baseline: 35s from chair height Goal status: Met 11.67 11/16/24  4.  Patient will be able to ambulate 500' with normal gait pattern without increased pain to access community.  Baseline:  Goal status: INITIAL  5.  Patient will  increase distance to at least 530ft Baseline: 374ft Goal status: IN PROGRESS 439ft 11/20/24   6. Patient will be able to tolerate 15 minutes of standing without initiation of back pain in order to progress upright tolerance and endurance  Baseline:  Goal Status: INITAL   PLAN:  PT FREQUENCY: 2x/week  PT DURATION: 10 weeks  PLANNED INTERVENTIONS: 97110-Therapeutic exercises, 97530- Therapeutic activity, 97112- Neuromuscular re-education, 97535- Self Care, 02859- Manual therapy, [phone]- Gait training, and Patient/Family education  PLAN FOR NEXT SESSION: LE strengthening, balance, gait dynamics   Thersia Alder, Student-PT 11/20/2024, 12:28 PM

## 2024-11-27 ENCOUNTER — Encounter: Payer: Self-pay | Admitting: Physical Therapy

## 2024-11-27 ENCOUNTER — Ambulatory Visit: Admitting: Physical Therapy

## 2024-11-27 DIAGNOSIS — Z96642 Presence of left artificial hip joint: Secondary | ICD-10-CM | POA: Insufficient documentation

## 2024-11-27 DIAGNOSIS — M6281 Muscle weakness (generalized): Secondary | ICD-10-CM | POA: Diagnosis present

## 2024-11-27 DIAGNOSIS — R293 Abnormal posture: Secondary | ICD-10-CM | POA: Insufficient documentation

## 2024-11-27 NOTE — Therapy (Signed)
 OUTPATIENT PHYSICAL THERAPY LOWER EXTREMITY TREATMENT   Patient Name: [name] MRN: [id number] DOB:05/15/1933, 89 y.o., male Today's Date: 11/27/2024  END OF SESSION:  PT End of Session - 11/27/24 1145     Visit Number 6    Date for Recertification  01/16/24    PT Start Time 1145    PT Stop Time 1230    PT Time Calculation (min) 45 min    Activity Tolerance Patient tolerated treatment well    Behavior During Therapy [facility] for tasks assessed/performed            Past Medical History:  Diagnosis Date   Atrial fibrillation [facility])    Coronary artery disease    Nov 2009-MI-stenting of the mid LAD drug-eluding stent   Hx of radiation therapy 05/15/14- 07/11/14   prostate 7800 cGy 40 sessions, seminal vesicles 5600 cGy 40 sessions   Hyperlipidemia    Hypertension    LV dysfunction    MI (myocardial infarction) (HCC) 1998   Osteopenia    Prostate CA (HCC) 05/05/2009   prostate cancer 11/09-tx with cryotherapy, Dr Chales   Past Surgical History:  Procedure Laterality Date   CARDIAC CATHETERIZATION     Nov 2009-drug eluding stent to mid LAD   HERNIA REPAIR     inguinal   IR KYPHO EA ADDL LEVEL THORACIC OR LUMBAR  04/28/2023   IR KYPHO LUMBAR INC FX REDUCE BONE BX UNI/BIL CANNULATION INC/IMAGING  04/28/2023   KYPHOPLASTY N/A 09/06/2024   Procedure: KYPHOPLASTY;  Surgeon: Burnetta Aures, MD;  Location: [facility] OR;  Service: Orthopedics;  Laterality: N/A;   PROSTATE BIOPSY  09/25/2008   gleason 4+3=7   PROSTATE CRYOABLATION  05/05/2009   Dr Chales   RIGHT/LEFT HEART CATH AND CORONARY ANGIOGRAPHY N/A 11/02/2023   Procedure: RIGHT/LEFT HEART CATH AND CORONARY ANGIOGRAPHY;  Surgeon: Jordan, Peter M, MD;  Location: [facility] INVASIVE CV LAB;  Service: Cardiovascular;  Laterality: N/A;   TOTAL HIP ARTHROPLASTY N/A 09/07/2024   Procedure: ARTHROPLASTY, HIP, TOTAL, ANTERIOR APPROACH, LEFT;  Surgeon: Kendal Franky SQUIBB, MD;  Location: [facility] OR;  Service: Orthopedics;  Laterality: N/A;  LEFT TOTAL  HIP ANTERIOR   Patient Active Problem List   Diagnosis Date Noted   Presence of stent in LAD coronary artery 09/09/2024   Chronic HFrEF (heart failure with reduced ejection fraction) (HCC) 09/09/2024   Persistent atrial fibrillation (HCC) 09/09/2024   Atherosclerosis of native coronary artery of native heart without angina pectoris 09/09/2024   Preoperative clearance 09/07/2024   Closed left hip fracture (HCC) 09/06/2024   History of myocardial infarction 10/02/2020   Anticoagulated 10/02/2020   Chronic atrial fibrillation (HCC) 11/14/2013   CAD S/P percutaneous coronary angioplasty    Essential hypertension    Ischemic cardiomyopathy    Dyslipidemia, goal LDL below 70    History of prostate cancer 05/05/2009    PCP: Olam Pinal  REFERRING PROVIDER: Lauraine Moores  REFERRING DIAG:  Lt THA 9/12    THERAPY DIAG:  S/P total left hip arthroplasty  Muscle weakness (generalized)  Abnormal posture  Rationale for Evaluation and Treatment: Rehabilitation  ONSET DATE: 09/07/24  SUBJECTIVE:   SUBJECTIVE STATEMENT: Feeling pretty good,    On the way to his kyphoplasty surgery on 09/07/24, pt experienced a fall in the hospital and following that he required the hip surgery. Kypholasty was not done, and scheduled out after hip rehab. Soreness in the left hip but not much pain; still experiencing low level pain in the back.   PERTINENT  HISTORY: See above  Hx of back sx- khyphoplasty 2024 and 2025 Total hip 2025  PAIN:  Are you having pain? Yes: NPRS scale: 0/10 L hip; 2/10 LBP Pain location: L hip;  Pain description: soreness Aggravating factors: Prolonged standing  Relieving factors: Rest  PRECAUTIONS: None  RED FLAGS: None   WEIGHT BEARING RESTRICTIONS: No; WBAT  FALLS:  Has patient fallen in last 6 months? Yes. Number of falls 1; in the hospital   LIVING ENVIRONMENT: Lives with: lives with their family and lives with their daughter Lives in:  House/apartment Stairs: Yes: Internal: 6 (garage) steps; can reach both; does not use staircase to the upstairs  Has following equipment at home: Single point cane  OCCUPATION: Retired  PLOF: Needs assistance with homemaking  PATIENT GOALS: to strengthening the left leg   NEXT MD VISIT:   OBJECTIVE:  Note: Objective measures were completed at Evaluation unless otherwise noted.  DIAGNOSTIC FINDINGS: Status post left hip replacement with expected postsurgical change.  AP pelvis and lateral views of L hip show stable hip arthroplasty. No signs of any subsidence, displacement or loosening.   COGNITION: Overall cognitive status: Within functional limits for tasks assessed     SENSATION: WFL  POSTURE: rounded shoulders, forward head, and increased thoracic kyphosis   LOWER EXTREMITY ROM: full ROM BLE    LOWER EXTREMITY MMT: 4+/5 BLE    LUMBAR ROM Flexion: mid shin, pain in back Extension: 25% with pain Right lateral flexion: mid thigh Left lateral flexion: mid thigh  Right rotation: 75% some pain Left rotation: 75% some pain   FUNCTIONAL TESTS:  5 times sit to stand: 35s Timed up and go (TUG): 16s 316ft   GAIT: Distance walked: in clinic distances Assistive device utilized: Single point cane Level of assistance: Modified independence Comments: decreased hip flexion, some circumduction with LLE                                                                                                                                 TREATMENT DATE:  11/27/24 NuStep L 5 x 5 min Bike L 4 x 4 min 4in box on airex forward & lateral step ups 2x10 HS curls 35lb 2x10 Leg Ext 10lb 2x12 Shoulder Ext 5lb 2x10  Heel raises 2x10 Leg press 50lb 2x10 Supine Lle SLR and abd 2x10   11/20/24 Reasses Hip 3 way (no resistance) 2x10 Seated marches 2 1/2# 2x20 Seated hamstring curls green resistance band 2x10 Lateral step ups/overs on airex  [place] taps on airex  Airex  balance (feet together, eyes closed, half tandem, tandem) STS on airex 2x10   11/16/24 NuStep L 5 x 6 min Goals   TUG 10.93  5X S2S  11.67 Leg press 30lb 2x10 HS curls 25lb 2x12 Leg Ext 2x12 6in step ups x10, min assit with LLE 20l resisted side steps Lateral 4in step ups 1 rails  1117/25 NuStep L 5 x  6 min Sit to stands 2x10 from elevated mat HS curls 25lb 2x10 Leg Ext 2x10 Bridges x10 SLR LLE x10, 2lb x10 Sideling hip abd LLE x10, 2lb x10 Standing march 2lb 2x10 1 hand for support Hip add ball squeeze 2x10 4in step ups x10 each  6inn x5 each     PATIENT EDUCATION:  Education details: POC, HEP Person educated: Patient Education method: Medical Illustrator Education comprehension: verbalized understanding and returned demonstration  HOME EXERCISE PROGRAM: Access Code: [email or web address] URL: [email or web address] Date: 11/06/2024 Prepared by: Almetta Fam  Exercises - Standing Single Leg Stance with Counter Support  - 2 x daily - 7 x weekly - 2 sets - 10 reps - Standing March with Counter Support  - 2 x daily - 7 x weekly - 2 sets - 10 reps - Standing 3-Way Leg Reach with Resistance at Ankles and Counter Support  - 2 x daily - 7 x weekly - 2 sets - 10 reps - Supine Bridge  - 2 x daily - 7 x weekly - 2 sets - 10 reps - Sit to Stand  - 2 x daily - 7 x weekly - 2 sets - 10 reps  ASSESSMENT:  CLINICAL IMPRESSION: Pt enters doing well. Some instability today with step ps using airex pad. Compensation with step ups when using LLE. Increase resistance tolerated woth machine level interventions. Tactile cues for posture required woth shoulder ext to du weakness  Patient is a 89 y.o. male who was seen today for physical therapy evaluation and treatment for L THA on 09/07/24. He was walking in the hospital to go in for back surgery and suffered a fall which resulted in a broken hip. Patient received home health PT. He is progressing really well, reports low  levels of pain for the hip and is able to walk without [facility] but uses it mostly for safety. Patient has a kyphotic back posture in sitting and standing. Pt exhibits circumduction of LLE during swing phase of gait and a forward trunk lean. Pt will benefit from PT to increase muscular endurance, increase quality of dynamic gait activities, and decrease pain in the back.    OBJECTIVE IMPAIRMENTS: Abnormal gait, decreased activity tolerance, decreased balance, difficulty walking, decreased strength, postural dysfunction, and pain.   ACTIVITY LIMITATIONS: Prolonged standing, pain with trunk rotation  PARTICIPATION LIMITATIONS: assistance with household chores   PERSONAL FACTORS: Age and Time since onset of injury/illness/exacerbation are also affecting patient's functional outcome.   REHAB POTENTIAL: Good  CLINICAL DECISION MAKING: Stable/uncomplicated  EVALUATION COMPLEXITY: Low   GOALS: Goals reviewed with patient? Yes  SHORT TERM GOALS: Target date: 12/11/24  Patient will be independent with initial HEP. Baseline:  Goal status: IN PROGRESS more than half of HEP independently 11/08/24, Met 11/16/24  2.  Patient will complete TUG <12s  Baseline: 16s w/o AD  Goal status: 10.93 w/o AD Met 11/16/24   LONG TERM GOALS: Target date: 01/16/24 Patient will be independent with advanced/ongoing HEP to improve outcomes and carryover.  Baseline:  Goal status: MET 11/20/24  2.  Patient will report at least 50-75% improvement in low back and hip pain to improve QOL. Baseline: 2/10 hip, 4/10 back Goal status: Met 80% 11/27/24  3.  Patient will demonstrate improved functional LE strength as demonstrated by 5xSTS <20s. Baseline: 35s from chair height Goal status: Met 11.67 11/16/24  4.  Patient will be able to ambulate 500' with normal gait pattern without increased pain to access community.  Baseline:  Goal  status: INITIAL  5.  Patient will increase distance to at least 567ft Baseline:  318ft Goal status: IN PROGRESS 440ft 11/20/24   6. Patient will be able to tolerate 15 minutes of standing without initiation of back pain in order to progress upright tolerance and endurance  Baseline:  Goal Status: INITAL   PLAN:  PT FREQUENCY: 2x/week  PT DURATION: 10 weeks  PLANNED INTERVENTIONS: 97110-Therapeutic exercises, 97530- Therapeutic activity, 97112- Neuromuscular re-education, 97535- Self Care, 02859- Manual therapy, [phone]- Gait training, and Patient/Family education  PLAN FOR NEXT SESSION: LE strengthening, balance, gait dynamics   Tanda KANDICE Sorrow, PTA 11/27/2024, 11:46 AM

## 2024-11-30 ENCOUNTER — Encounter: Payer: Self-pay | Admitting: Physical Therapy

## 2024-11-30 ENCOUNTER — Ambulatory Visit: Admitting: Physical Therapy

## 2024-11-30 DIAGNOSIS — Z96642 Presence of left artificial hip joint: Secondary | ICD-10-CM | POA: Diagnosis not present

## 2024-11-30 DIAGNOSIS — M6281 Muscle weakness (generalized): Secondary | ICD-10-CM

## 2024-11-30 DIAGNOSIS — R293 Abnormal posture: Secondary | ICD-10-CM

## 2024-11-30 NOTE — Therapy (Signed)
 OUTPATIENT PHYSICAL THERAPY LOWER EXTREMITY TREATMENT   Patient Name: [name] MRN: [id number] DOB:[birth year], 89 y.o., male Today's Date: 11/30/2024  END OF SESSION:  PT End of Session - 11/30/24 1014     Visit Number 7    Date for Recertification  01/16/24    PT Start Time 1013    PT Stop Time 1100    PT Time Calculation (min) 47 min            Past Medical History:  Diagnosis Date   Atrial fibrillation (HCC)    Coronary artery disease    Nov 2009-MI-stenting of the mid LAD drug-eluding stent   Hx of radiation therapy 05/15/14- 07/11/14   prostate 7800 cGy 40 sessions, seminal vesicles 5600 cGy 40 sessions   Hyperlipidemia    Hypertension    LV dysfunction    MI (myocardial infarction) (HCC) 1998   Osteopenia    Prostate CA (HCC) 05/05/2009   prostate cancer 11/09-tx with cryotherapy, Dr Chales   Past Surgical History:  Procedure Laterality Date   CARDIAC CATHETERIZATION     Nov 2009-drug eluding stent to mid LAD   HERNIA REPAIR     inguinal   IR KYPHO EA ADDL LEVEL THORACIC OR LUMBAR  04/28/2023   IR KYPHO LUMBAR INC FX REDUCE BONE BX UNI/BIL CANNULATION INC/IMAGING  04/28/2023   KYPHOPLASTY N/A 09/06/2024   Procedure: KYPHOPLASTY;  Surgeon: Burnetta Aures, MD;  Location: [facility] OR;  Service: Orthopedics;  Laterality: N/A;   PROSTATE BIOPSY  09/25/2008   gleason 4+3=7   PROSTATE CRYOABLATION  05/05/2009   Dr Chales   RIGHT/LEFT HEART CATH AND CORONARY ANGIOGRAPHY N/A 11/02/2023   Procedure: RIGHT/LEFT HEART CATH AND CORONARY ANGIOGRAPHY;  Surgeon: Jordan, Peter M, MD;  Location: [facility] INVASIVE CV LAB;  Service: Cardiovascular;  Laterality: N/A;   TOTAL HIP ARTHROPLASTY N/A 09/07/2024   Procedure: ARTHROPLASTY, HIP, TOTAL, ANTERIOR APPROACH, LEFT;  Surgeon: Kendal Franky SQUIBB, MD;  Location: [facility] OR;  Service: Orthopedics;  Laterality: N/A;  LEFT TOTAL HIP ANTERIOR   Patient Active Problem List   Diagnosis Date Noted   Presence of stent in LAD coronary artery  09/09/2024   Chronic HFrEF (heart failure with reduced ejection fraction) (HCC) 09/09/2024   Persistent atrial fibrillation (HCC) 09/09/2024   Atherosclerosis of native coronary artery of native heart without angina pectoris 09/09/2024   Preoperative clearance 09/07/2024   Closed left hip fracture (HCC) 09/06/2024   History of myocardial infarction 10/02/2020   Anticoagulated 10/02/2020   Chronic atrial fibrillation (HCC) 11/14/2013   CAD S/P percutaneous coronary angioplasty    Essential hypertension    Ischemic cardiomyopathy    Dyslipidemia, goal LDL below 70    History of prostate cancer 05/05/2009    PCP: Olam Pinal  REFERRING PROVIDER: Lauraine Moores  REFERRING DIAG:  Lt THA 9/12    THERAPY DIAG:  S/P total left hip arthroplasty  Muscle weakness (generalized)  Abnormal posture  Rationale for Evaluation and Treatment: Rehabilitation  ONSET DATE: 09/07/24  SUBJECTIVE:   SUBJECTIVE STATEMENT: Doing fairly well, come soreness form last session   On the way to his kyphoplasty surgery on 09/07/24, pt experienced a fall in the hospital and following that he required the hip surgery. Kypholasty was not done, and scheduled out after hip rehab. Soreness in the left hip but not much pain; still experiencing low level pain in the back.   PERTINENT HISTORY: See above  Hx of back sx- khyphoplasty 2024 and 2025 Total hip 2025  PAIN:  Are you having pain? Yes: NPRS scale: 0/10 L hip; 2/10 LBP Pain location: L hip;  Pain description: soreness Aggravating factors: Prolonged standing  Relieving factors: Rest  PRECAUTIONS: None  RED FLAGS: None   WEIGHT BEARING RESTRICTIONS: No; WBAT  FALLS:  Has patient fallen in last 6 months? Yes. Number of falls 1; in the hospital   LIVING ENVIRONMENT: Lives with: lives with their family and lives with their daughter Lives in: House/apartment Stairs: Yes: Internal: 6 (garage) steps; can reach both; does not use staircase to the  upstairs  Has following equipment at home: Single point cane  OCCUPATION: Retired  PLOF: Needs assistance with homemaking  PATIENT GOALS: to strengthening the left leg   NEXT MD VISIT:   OBJECTIVE:  Note: Objective measures were completed at Evaluation unless otherwise noted.  DIAGNOSTIC FINDINGS: Status post left hip replacement with expected postsurgical change.  AP pelvis and lateral views of L hip show stable hip arthroplasty. No signs of any subsidence, displacement or loosening.   COGNITION: Overall cognitive status: Within functional limits for tasks assessed     SENSATION: WFL  POSTURE: rounded shoulders, forward head, and increased thoracic kyphosis   LOWER EXTREMITY ROM: full ROM BLE    LOWER EXTREMITY MMT: 4+/5 BLE    LUMBAR ROM Flexion: mid shin, pain in back Extension: 25% with pain Right lateral flexion: mid thigh Left lateral flexion: mid thigh  Right rotation: 75% some pain Left rotation: 75% some pain   FUNCTIONAL TESTS:  5 times sit to stand: 35s Timed up and go (TUG): 16s 370ft   GAIT: Distance walked: in clinic distances Assistive device utilized: Single point cane Level of assistance: Modified independence Comments: decreased hip flexion, some circumduction with LLE                                                                                                                                 TREATMENT DATE:  11/30/24 NuStep L 5 x 6 min 8in step ups from airex x10 each Sit to stand x10, holding red ball x10 HS curls 35lb 2x15 Leg Ext 10lb 2x15 On BOSU reaching  Heel raises from floor  Resisted side steps 20lb x 5 each   11/27/24 NuStep L 5 x 5 min Bike L 4 x 4 min 4in box on airex forward & lateral step ups 2x10 HS curls 35lb 2x10 Leg Ext 10lb 2x12 Shoulder Ext 5lb 2x10  Heel raises 2x10 Leg press 50lb 2x10 Supine LLE SLR and abd 2x10   11/20/24 Reasses Hip 3 way (no resistance) 2x10 Seated marches 2 1/2#  2x20 Seated hamstring curls green resistance band 2x10 Lateral step ups/overs on airex  [place] taps on airex  Airex balance (feet together, eyes closed, half tandem, tandem) STS on airex 2x10   11/16/24 NuStep L 5 x 6 min Goals   TUG 10.93  5X S2S  11.67 Leg press 30lb  2x10 HS curls 25lb 2x12 Leg Ext 2x12 6in step ups x10, min assit with LLE 20l resisted side steps Lateral 4in step ups 1 rails  1117/25 NuStep L 5 x 6 min Sit to stands 2x10 from elevated mat HS curls 25lb 2x10 Leg Ext 2x10 Bridges x10 SLR LLE x10, 2lb x10 Sideling hip abd LLE x10, 2lb x10 Standing march 2lb 2x10 1 hand for support Hip add ball squeeze 2x10 4in step ups x10 each  6inn x5 each     PATIENT EDUCATION:  Education details: POC, HEP Person educated: Patient Education method: Medical Illustrator Education comprehension: verbalized understanding and returned demonstration  HOME EXERCISE PROGRAM: Access Code: [email or web address] URL: [email or web address] Date: 11/06/2024 Prepared by: Almetta Fam  Exercises - Standing Single Leg Stance with Counter Support  - 2 x daily - 7 x weekly - 2 sets - 10 reps - Standing March with Counter Support  - 2 x daily - 7 x weekly - 2 sets - 10 reps - Standing 3-Way Leg Reach with Resistance at Ankles and Counter Support  - 2 x daily - 7 x weekly - 2 sets - 10 reps - Supine Bridge  - 2 x daily - 7 x weekly - 2 sets - 10 reps - Sit to Stand  - 2 x daily - 7 x weekly - 2 sets - 10 reps  ASSESSMENT:  CLINICAL IMPRESSION: Again enters doing well ambulating w/ SPC. He sated he only keeps it with him for confidence.  Some instability today when standing on BOSU. Increase resistance tolerated with leg curls and extensions. Compensation with step ups and sit to stands using momentum.    Patient is a 89 y.o. male who was seen today for physical therapy evaluation and treatment for L THA on 09/07/24. He was walking in the hospital to go in for  back surgery and suffered a fall which resulted in a broken hip. Patient received home health PT. He is progressing really well, reports low levels of pain for the hip and is able to walk without [facility] but uses it mostly for safety. Patient has a kyphotic back posture in sitting and standing. Pt exhibits circumduction of LLE during swing phase of gait and a forward trunk lean. Pt will benefit from PT to increase muscular endurance, increase quality of dynamic gait activities, and decrease pain in the back.    OBJECTIVE IMPAIRMENTS: Abnormal gait, decreased activity tolerance, decreased balance, difficulty walking, decreased strength, postural dysfunction, and pain.   ACTIVITY LIMITATIONS: Prolonged standing, pain with trunk rotation  PARTICIPATION LIMITATIONS: assistance with household chores   PERSONAL FACTORS: Age and Time since onset of injury/illness/exacerbation are also affecting patient's functional outcome.   REHAB POTENTIAL: Good  CLINICAL DECISION MAKING: Stable/uncomplicated  EVALUATION COMPLEXITY: Low   GOALS: Goals reviewed with patient? Yes  SHORT TERM GOALS: Target date: 12/11/24  Patient will be independent with initial HEP. Baseline:  Goal status: IN PROGRESS more than half of HEP independently 11/08/24, Met 11/16/24  2.  Patient will complete TUG <12s  Baseline: 16s w/o AD  Goal status: 10.93 w/o AD Met 11/16/24   LONG TERM GOALS: Target date: 01/16/24 Patient will be independent with advanced/ongoing HEP to improve outcomes and carryover.  Baseline:  Goal status: MET 11/20/24  2.  Patient will report at least 50-75% improvement in low back and hip pain to improve QOL. Baseline: 2/10 hip, 4/10 back Goal status: Met 80% 11/27/24  3.  Patient will demonstrate improved functional LE strength  as demonstrated by 5xSTS <20s. Baseline: 35s from chair height Goal status: Met 11.67 11/16/24  4.  Patient will be able to ambulate 500' with normal gait pattern without  increased pain to access community.  Baseline:  Goal status: Progressing 11/30/24  5.  Patient will increase distance to at least 531ft Baseline: 361ft Goal status: IN PROGRESS 465ft 11/20/24   6. Patient will be able to tolerate 15 minutes of standing without initiation of back pain in order to progress upright tolerance and endurance  Baseline:  Goal Status: INITAL   PLAN:  PT FREQUENCY: 2x/week  PT DURATION: 10 weeks  PLANNED INTERVENTIONS: 97110-Therapeutic exercises, 97530- Therapeutic activity, 97112- Neuromuscular re-education, 97535- Self Care, [id number]- Manual therapy, [phone]- Gait training, and Patient/Family education  PLAN FOR NEXT SESSION: LE strengthening, balance, gait dynamics   Tanda KANDICE Sorrow, PTA 11/30/2024, 10:14 AM

## 2024-12-05 ENCOUNTER — Ambulatory Visit: Admitting: Physical Therapy

## 2024-12-05 ENCOUNTER — Encounter: Payer: Self-pay | Admitting: Physical Therapy

## 2024-12-05 DIAGNOSIS — R293 Abnormal posture: Secondary | ICD-10-CM

## 2024-12-05 DIAGNOSIS — Z96642 Presence of left artificial hip joint: Secondary | ICD-10-CM | POA: Diagnosis not present

## 2024-12-05 DIAGNOSIS — M6281 Muscle weakness (generalized): Secondary | ICD-10-CM

## 2024-12-05 NOTE — Therapy (Signed)
 OUTPATIENT PHYSICAL THERAPY LOWER EXTREMITY TREATMENT   Patient Name: [name] MRN: [id number] DOB:[birth year], 89 y.o., male Today's Date: 12/05/2024  END OF SESSION:  PT End of Session - 12/05/24 1017     Visit Number 8    Date for Recertification  01/16/24    PT Start Time 1015    PT Stop Time 1100    PT Time Calculation (min) 45 min    Activity Tolerance Patient tolerated treatment well    Behavior During Therapy [facility] for tasks assessed/performed            Past Medical History:  Diagnosis Date   Atrial fibrillation (HCC)    Coronary artery disease    Nov 2009-MI-stenting of the mid LAD drug-eluding stent   Hx of radiation therapy 05/15/14- 07/11/14   prostate 7800 cGy 40 sessions, seminal vesicles 5600 cGy 40 sessions   Hyperlipidemia    Hypertension    LV dysfunction    MI (myocardial infarction) (HCC) 1998   Osteopenia    Prostate CA (HCC) 05/05/2009   prostate cancer 11/09-tx with cryotherapy, Dr Chales   Past Surgical History:  Procedure Laterality Date   CARDIAC CATHETERIZATION     Nov 2009-drug eluding stent to mid LAD   HERNIA REPAIR     inguinal   IR KYPHO EA ADDL LEVEL THORACIC OR LUMBAR  04/28/2023   IR KYPHO LUMBAR INC FX REDUCE BONE BX UNI/BIL CANNULATION INC/IMAGING  04/28/2023   KYPHOPLASTY N/A 09/06/2024   Procedure: KYPHOPLASTY;  Surgeon: Burnetta Aures, MD;  Location: [facility] OR;  Service: Orthopedics;  Laterality: N/A;   PROSTATE BIOPSY  09/25/2008   gleason 4+3=7   PROSTATE CRYOABLATION  05/05/2009   Dr Chales   RIGHT/LEFT HEART CATH AND CORONARY ANGIOGRAPHY N/A 11/02/2023   Procedure: RIGHT/LEFT HEART CATH AND CORONARY ANGIOGRAPHY;  Surgeon: Jordan, Peter M, MD;  Location: [facility] INVASIVE CV LAB;  Service: Cardiovascular;  Laterality: N/A;   TOTAL HIP ARTHROPLASTY N/A 09/07/2024   Procedure: ARTHROPLASTY, HIP, TOTAL, ANTERIOR APPROACH, LEFT;  Surgeon: Kendal Franky SQUIBB, MD;  Location: [facility] OR;  Service: Orthopedics;  Laterality: N/A;  LEFT TOTAL  HIP ANTERIOR   Patient Active Problem List   Diagnosis Date Noted   Presence of stent in LAD coronary artery 09/09/2024   Chronic HFrEF (heart failure with reduced ejection fraction) (HCC) 09/09/2024   Persistent atrial fibrillation (HCC) 09/09/2024   Atherosclerosis of native coronary artery of native heart without angina pectoris 09/09/2024   Preoperative clearance 09/07/2024   Closed left hip fracture (HCC) 09/06/2024   History of myocardial infarction 10/02/2020   Anticoagulated 10/02/2020   Chronic atrial fibrillation (HCC) 11/14/2013   CAD S/P percutaneous coronary angioplasty    Essential hypertension    Ischemic cardiomyopathy    Dyslipidemia, goal LDL below 70    History of prostate cancer 05/05/2009    PCP: Olam Pinal  REFERRING PROVIDER: Lauraine Moores  REFERRING DIAG:  Lt THA 9/12    THERAPY DIAG:  S/P total left hip arthroplasty  Abnormal posture  Muscle weakness (generalized)  Rationale for Evaluation and Treatment: Rehabilitation  ONSET DATE: 09/07/24  SUBJECTIVE:   SUBJECTIVE STATEMENT: Hip doctor was fine yesterday   On the way to his kyphoplasty surgery on 09/07/24, pt experienced a fall in the hospital and following that he required the hip surgery. Kypholasty was not done, and scheduled out after hip rehab. Soreness in the left hip but not much pain; still experiencing low level pain in the back.  PERTINENT HISTORY: See above  Hx of back sx- khyphoplasty 2024 and 2025 Total hip 2025  PAIN:  Are you having pain? Yes: NPRS scale: 0/10 L hip; 4/10 LBP Pain location: L hip;  Pain description: soreness Aggravating factors: Prolonged standing  Relieving factors: Rest  PRECAUTIONS: None  RED FLAGS: None   WEIGHT BEARING RESTRICTIONS: No; WBAT  FALLS:  Has patient fallen in last 6 months? Yes. Number of falls 1; in the hospital   LIVING ENVIRONMENT: Lives with: lives with their family and lives with their daughter Lives in:  House/apartment Stairs: Yes: Internal: 6 (garage) steps; can reach both; does not use staircase to the upstairs  Has following equipment at home: Single point cane  OCCUPATION: Retired  PLOF: Needs assistance with homemaking  PATIENT GOALS: to strengthening the left leg   NEXT MD VISIT:   OBJECTIVE:  Note: Objective measures were completed at Evaluation unless otherwise noted.  DIAGNOSTIC FINDINGS: Status post left hip replacement with expected postsurgical change.  AP pelvis and lateral views of L hip show stable hip arthroplasty. No signs of any subsidence, displacement or loosening.   COGNITION: Overall cognitive status: Within functional limits for tasks assessed     SENSATION: WFL  POSTURE: rounded shoulders, forward head, and increased thoracic kyphosis   LOWER EXTREMITY ROM: full ROM BLE    LOWER EXTREMITY MMT: 4+/5 BLE    LUMBAR ROM Flexion: mid shin, pain in back Extension: 25% with pain Right lateral flexion: mid thigh Left lateral flexion: mid thigh  Right rotation: 75% some pain Left rotation: 75% some pain   FUNCTIONAL TESTS:  5 times sit to stand: 35s Timed up and go (TUG): 16s 325ft   GAIT: Distance walked: in clinic distances Assistive device utilized: Single point cane Level of assistance: Modified independence Comments: decreased hip flexion, some circumduction with LLE                                                                                                                                 TREATMENT DATE:  12/05/24 Bike L 3 x 6 min Sit to stands holding 3lb dumbbell x10, 5lb x10 Rows blue 2x10 HS curls 35lb 2x15 Leg Ext 15lb 2x15 6in step ups x10 each Sime instability requiring min guard at times  Tmill pushes 3x30 Leg press 40lb 2x15  11/30/24 NuStep L 5 x 6 min 8in step ups from airex x10 each Sit to stand x10, holding red ball x10 HS curls 35lb 2x15 Leg Ext 10lb 2x15 On BOSU reaching  Heel raises from floor   Resisted side steps 20lb x 5 each   11/27/24 NuStep L 5 x 5 min Bike L 4 x 4 min 4in box on airex forward & lateral step ups 2x10 HS curls 35lb 2x10 Leg Ext 10lb 2x12 Shoulder Ext 5lb 2x10  Heel raises 2x10 Leg press 50lb 2x10 Supine LLE SLR and abd 2x10   11/20/24 Reasses  Hip 3 way (no resistance) 2x10 Seated marches 2 1/2# 2x20 Seated hamstring curls green resistance band 2x10 Lateral step ups/overs on airex  [place] taps on airex  Airex balance (feet together, eyes closed, half tandem, tandem) STS on airex 2x10   11/16/24 NuStep L 5 x 6 min Goals   TUG 10.93  5X S2S  11.67 Leg press 30lb 2x10 HS curls 25lb 2x12 Leg Ext 2x12 6in step ups x10, min assit with LLE 20l resisted side steps Lateral 4in step ups 1 rails  1117/25 NuStep L 5 x 6 min Sit to stands 2x10 from elevated mat HS curls 25lb 2x10 Leg Ext 2x10 Bridges x10 SLR LLE x10, 2lb x10 Sideling hip abd LLE x10, 2lb x10 Standing march 2lb 2x10 1 hand for support Hip add ball squeeze 2x10 4in step ups x10 each  6inn x5 each     PATIENT EDUCATION:  Education details: POC, HEP Person educated: Patient Education method: Medical Illustrator Education comprehension: verbalized understanding and returned demonstration  HOME EXERCISE PROGRAM: Access Code: [email or web address] URL: [email or web address] Date: 11/06/2024 Prepared by: Almetta Fam  Exercises - Standing Single Leg Stance with Counter Support  - 2 x daily - 7 x weekly - 2 sets - 10 reps - Standing March with Counter Support  - 2 x daily - 7 x weekly - 2 sets - 10 reps - Standing 3-Way Leg Reach with Resistance at Ankles and Counter Support  - 2 x daily - 7 x weekly - 2 sets - 10 reps - Supine Bridge  - 2 x daily - 7 x weekly - 2 sets - 10 reps - Sit to Stand  - 2 x daily - 7 x weekly - 2 sets - 10 reps  ASSESSMENT:  CLINICAL IMPRESSION: Pt enters doing well ambulating w/o SPC.  He stated that's his hip surgeon  was very pleased with his Increase resistance tolerated with sit to stands. Compensation with step ups noted due to weakness. No reports of increase pain during session. He will return to the MD for his back on Wednesday.  Patient is a 89 y.o. male who was seen today for physical therapy evaluation and treatment for L THA on 09/07/24. He was walking in the hospital to go in for back surgery and suffered a fall which resulted in a broken hip. Patient received home health PT. He is progressing really well, reports low levels of pain for the hip and is able to walk without [facility] but uses it mostly for safety. Patient has a kyphotic back posture in sitting and standing. Pt exhibits circumduction of LLE during swing phase of gait and a forward trunk lean. Pt will benefit from PT to increase muscular endurance, increase quality of dynamic gait activities, and decrease pain in the back.    OBJECTIVE IMPAIRMENTS: Abnormal gait, decreased activity tolerance, decreased balance, difficulty walking, decreased strength, postural dysfunction, and pain.   ACTIVITY LIMITATIONS: Prolonged standing, pain with trunk rotation  PARTICIPATION LIMITATIONS: assistance with household chores   PERSONAL FACTORS: Age and Time since onset of injury/illness/exacerbation are also affecting patient's functional outcome.   REHAB POTENTIAL: Good  CLINICAL DECISION MAKING: Stable/uncomplicated  EVALUATION COMPLEXITY: Low   GOALS: Goals reviewed with patient? Yes  SHORT TERM GOALS: Target date: 12/11/24  Patient will be independent with initial HEP. Baseline:  Goal status: IN PROGRESS more than half of HEP independently 11/08/24, Met 11/16/24  2.  Patient will complete TUG <12s  Baseline: 16s w/o AD  Goal  status: 10.93 w/o AD Met 11/16/24   LONG TERM GOALS: Target date: 01/16/24 Patient will be independent with advanced/ongoing HEP to improve outcomes and carryover.  Baseline:  Goal status: MET 11/20/24  2.  Patient  will report at least 50-75% improvement in low back and hip pain to improve QOL. Baseline: 2/10 hip, 4/10 back Goal status: Met 80% 11/27/24  3.  Patient will demonstrate improved functional LE strength as demonstrated by 5xSTS <20s. Baseline: 35s from chair height Goal status: Met 11.67 11/16/24  4.  Patient will be able to ambulate 500' with normal gait pattern without increased pain to access community.  Baseline:  Goal status: Progressing 11/30/24  5.  Patient will increase distance to at least 522ft Baseline: 326ft Goal status: IN PROGRESS 429ft 11/20/24   6. Patient will be able to tolerate 15 minutes of standing without initiation of back pain in order to progress upright tolerance and endurance  Baseline:  Goal Status: INITAL   PLAN:  PT FREQUENCY: 2x/week  PT DURATION: 10 weeks  PLANNED INTERVENTIONS: 97110-Therapeutic exercises, 97530- Therapeutic activity, 97112- Neuromuscular re-education, 97535- Self Care, [id number]- Manual therapy, [phone]- Gait training, and Patient/Family education  PLAN FOR NEXT SESSION: LE strengthening, balance, gait dynamics   Tanda KANDICE Sorrow, PTA 12/05/2024, 10:18 AM

## 2024-12-07 ENCOUNTER — Ambulatory Visit: Admitting: Physical Therapy

## 2024-12-07 ENCOUNTER — Encounter: Payer: Self-pay | Admitting: Physical Therapy

## 2024-12-07 DIAGNOSIS — M6281 Muscle weakness (generalized): Secondary | ICD-10-CM

## 2024-12-07 DIAGNOSIS — Z96642 Presence of left artificial hip joint: Secondary | ICD-10-CM | POA: Diagnosis not present

## 2024-12-07 DIAGNOSIS — R293 Abnormal posture: Secondary | ICD-10-CM

## 2024-12-07 NOTE — Therapy (Signed)
 OUTPATIENT PHYSICAL THERAPY LOWER EXTREMITY TREATMENT   Patient Name: [name] MRN: [id number] DOB:1933/04/02, 89 y.o., male Today's Date: 12/07/2024  END OF SESSION:  PT End of Session - 12/07/24 1015     Visit Number 9    Date for Recertification  01/16/24    Authorization Type Medicare    PT Start Time 1013    PT Stop Time 1058    PT Time Calculation (min) 45 min    Activity Tolerance Patient tolerated treatment well    Behavior During Therapy [facility] for tasks assessed/performed            Past Medical History:  Diagnosis Date   Atrial fibrillation (HCC)    Coronary artery disease    Nov 2009-MI-stenting of the mid LAD drug-eluding stent   Hx of radiation therapy 05/15/14- 07/11/14   prostate 7800 cGy 40 sessions, seminal vesicles 5600 cGy 40 sessions   Hyperlipidemia    Hypertension    LV dysfunction    MI (myocardial infarction) (HCC) 1998   Osteopenia    Prostate CA (HCC) 05/05/2009   prostate cancer 11/09-tx with cryotherapy, Dr Chales   Past Surgical History:  Procedure Laterality Date   CARDIAC CATHETERIZATION     Nov 2009-drug eluding stent to mid LAD   HERNIA REPAIR     inguinal   IR KYPHO EA ADDL LEVEL THORACIC OR LUMBAR  04/28/2023   IR KYPHO LUMBAR INC FX REDUCE BONE BX UNI/BIL CANNULATION INC/IMAGING  04/28/2023   KYPHOPLASTY N/A 09/06/2024   Procedure: KYPHOPLASTY;  Surgeon: Burnetta Aures, MD;  Location: [facility] OR;  Service: Orthopedics;  Laterality: N/A;   PROSTATE BIOPSY  09/25/2008   gleason 4+3=7   PROSTATE CRYOABLATION  05/05/2009   Dr Chales   RIGHT/LEFT HEART CATH AND CORONARY ANGIOGRAPHY N/A 11/02/2023   Procedure: RIGHT/LEFT HEART CATH AND CORONARY ANGIOGRAPHY;  Surgeon: Jordan, Peter M, MD;  Location: [facility] INVASIVE CV LAB;  Service: Cardiovascular;  Laterality: N/A;   TOTAL HIP ARTHROPLASTY N/A 09/07/2024   Procedure: ARTHROPLASTY, HIP, TOTAL, ANTERIOR APPROACH, LEFT;  Surgeon: Kendal Franky SQUIBB, MD;  Location: [facility] OR;  Service: Orthopedics;   Laterality: N/A;  LEFT TOTAL HIP ANTERIOR   Patient Active Problem List   Diagnosis Date Noted   Presence of stent in LAD coronary artery 09/09/2024   Chronic HFrEF (heart failure with reduced ejection fraction) (HCC) 09/09/2024   Persistent atrial fibrillation (HCC) 09/09/2024   Atherosclerosis of native coronary artery of native heart without angina pectoris 09/09/2024   Preoperative clearance 09/07/2024   Closed left hip fracture (HCC) 09/06/2024   History of myocardial infarction 10/02/2020   Anticoagulated 10/02/2020   Chronic atrial fibrillation (HCC) 11/14/2013   CAD S/P percutaneous coronary angioplasty    Essential hypertension    Ischemic cardiomyopathy    Dyslipidemia, goal LDL below 70    History of prostate cancer 05/05/2009    PCP: Olam Pinal  REFERRING PROVIDER: Lauraine Moores  REFERRING DIAG:  Lt THA 9/12    THERAPY DIAG:  S/P total left hip arthroplasty  Abnormal posture  Muscle weakness (generalized)  Rationale for Evaluation and Treatment: Rehabilitation  ONSET DATE: 09/07/24  SUBJECTIVE:   SUBJECTIVE STATEMENT: I am always stiff in the morning and after sitting, hard to get going at first   On the way to his kyphoplasty surgery on 09/07/24, pt experienced a fall in the hospital and following that he required the hip surgery. Kypholasty was not done, and scheduled out after hip rehab. Soreness in  the left hip but not much pain; still experiencing low level pain in the back.   PERTINENT HISTORY: See above  Hx of back sx- khyphoplasty 2024 and 2025 Total hip 2025  PAIN:  Are you having pain? Yes: NPRS scale: 0/10 L hip; 4/10 LBP Pain location: L hip;  Pain description: soreness Aggravating factors: Prolonged standing  Relieving factors: Rest  PRECAUTIONS: None  RED FLAGS: None   WEIGHT BEARING RESTRICTIONS: No; WBAT  FALLS:  Has patient fallen in last 6 months? Yes. Number of falls 1; in the hospital   LIVING ENVIRONMENT: Lives  with: lives with their family and lives with their daughter Lives in: House/apartment Stairs: Yes: Internal: 6 (garage) steps; can reach both; does not use staircase to the upstairs  Has following equipment at home: Single point cane  OCCUPATION: Retired  PLOF: Needs assistance with homemaking  PATIENT GOALS: to strengthening the left leg   NEXT MD VISIT:   OBJECTIVE:  Note: Objective measures were completed at Evaluation unless otherwise noted.  DIAGNOSTIC FINDINGS: Status post left hip replacement with expected postsurgical change.  AP pelvis and lateral views of L hip show stable hip arthroplasty. No signs of any subsidence, displacement or loosening.   COGNITION: Overall cognitive status: Within functional limits for tasks assessed     SENSATION: WFL  POSTURE: rounded shoulders, forward head, and increased thoracic kyphosis   LOWER EXTREMITY ROM: full ROM BLE    LOWER EXTREMITY MMT: 4+/5 BLE    LUMBAR ROM Flexion: mid shin, pain in back Extension: 25% with pain Right lateral flexion: mid thigh Left lateral flexion: mid thigh  Right rotation: 75% some pain Left rotation: 75% some pain   FUNCTIONAL TESTS:  5 times sit to stand: 35s Timed up and go (TUG): 16s 333ft   GAIT: Distance walked: in clinic distances Assistive device utilized: Single point cane Level of assistance: Modified independence Comments: decreased hip flexion, some circumduction with LLE                                                                                                                                 TREATMENT DATE:  12/07/24 Nustep level 5 x 5 minutes Bike level 5 x 4 minutes 35# HS curls 2x10 10# leg extension 2x10, then 5# left only More square 3 blocks step overs On airex ball toss Cone toe touches solid and then on airex, some use of the [facility] or HHA Leg press 20# bilateral and then left only 2x10 30# resisted gait all directions  12/05/24 Bike L 3 x 6  min Sit to stands holding 3lb dumbbell x10, 5lb x10 Rows blue 2x10 HS curls 35lb 2x15 Leg Ext 15lb 2x15 6in step ups x10 each Sime instability requiring min guard at times  Tmill pushes 3x30 Leg press 40lb 2x15  11/30/24 NuStep L 5 x 6 min 8in step ups from airex x10 each Sit to stand x10, holding red  ball x10 HS curls 35lb 2x15 Leg Ext 10lb 2x15 On BOSU reaching  Heel raises from floor  Resisted side steps 20lb x 5 each   11/27/24 NuStep L 5 x 5 min Bike L 4 x 4 min 4in box on airex forward & lateral step ups 2x10 HS curls 35lb 2x10 Leg Ext 10lb 2x12 Shoulder Ext 5lb 2x10  Heel raises 2x10 Leg press 50lb 2x10 Supine LLE SLR and abd 2x10   11/20/24 Reasses Hip 3 way (no resistance) 2x10 Seated marches 2 1/2# 2x20 Seated hamstring curls green resistance band 2x10 Lateral step ups/overs on airex  [place] taps on airex  Airex balance (feet together, eyes closed, half tandem, tandem) STS on airex 2x10   11/16/24 NuStep L 5 x 6 min Goals   TUG 10.93  5X S2S  11.67 Leg press 30lb 2x10 HS curls 25lb 2x12 Leg Ext 2x12 6in step ups x10, min assit with LLE 20l resisted side steps Lateral 4in step ups 1 rails  1117/25 NuStep L 5 x 6 min Sit to stands 2x10 from elevated mat HS curls 25lb 2x10 Leg Ext 2x10 Bridges x10 SLR LLE x10, 2lb x10 Sideling hip abd LLE x10, 2lb x10 Standing march 2lb 2x10 1 hand for support Hip add ball squeeze 2x10 4in step ups x10 each  6inn x5 each     PATIENT EDUCATION:  Education details: POC, HEP Person educated: Patient Education method: Medical Illustrator Education comprehension: verbalized understanding and returned demonstration  HOME EXERCISE PROGRAM: Access Code: [email or web address] URL: [email or web address] Date: 11/06/2024 Prepared by: Almetta Fam  Exercises - Standing Single Leg Stance with Counter Support  - 2 x daily - 7 x weekly - 2 sets - 10 reps - Standing March with Counter Support   - 2 x daily - 7 x weekly - 2 sets - 10 reps - Standing 3-Way Leg Reach with Resistance at Ankles and Counter Support  - 2 x daily - 7 x weekly - 2 sets - 10 reps - Supine Bridge  - 2 x daily - 7 x weekly - 2 sets - 10 reps - Sit to Stand  - 2 x daily - 7 x weekly - 2 sets - 10 reps  ASSESSMENT:  CLINICAL IMPRESSION: Pt enters doing well ambulating w/o SPC.  He stated that's his hip surgeon was very pleased will be sending note to the back surgeon and patient will meet with back surgeon next week and they will make a decision on PT for back vs surgery.  Compensation with step ups noted due to weakness. No reports of increase pain during session. He will return to the MD for his back on Wednesday.  Patient is a 89 y.o. male who was seen today for physical therapy evaluation and treatment for L THA on 09/07/24. He was walking in the hospital to go in for back surgery and suffered a fall which resulted in a broken hip. Patient received home health PT. He is progressing really well, reports low levels of pain for the hip and is able to walk without [facility] but uses it mostly for safety. Patient has a kyphotic back posture in sitting and standing. Pt exhibits circumduction of LLE during swing phase of gait and a forward trunk lean. Pt will benefit from PT to increase muscular endurance, increase quality of dynamic gait activities, and decrease pain in the back.    OBJECTIVE IMPAIRMENTS: Abnormal gait, decreased activity tolerance, decreased balance, difficulty walking, decreased strength, postural dysfunction,  and pain.   ACTIVITY LIMITATIONS: Prolonged standing, pain with trunk rotation  PARTICIPATION LIMITATIONS: assistance with household chores   PERSONAL FACTORS: Age and Time since onset of injury/illness/exacerbation are also affecting patient's functional outcome.   REHAB POTENTIAL: Good  CLINICAL DECISION MAKING: Stable/uncomplicated  EVALUATION COMPLEXITY: Low   GOALS: Goals reviewed with  patient? Yes  SHORT TERM GOALS: Target date: 12/11/24  Patient will be independent with initial HEP. Baseline:  Goal status: IN PROGRESS more than half of HEP independently 11/08/24, Met 11/16/24  2.  Patient will complete TUG <12s  Baseline: 16s w/o AD  Goal status: 10.93 w/o AD Met 11/16/24   LONG TERM GOALS: Target date: 01/16/24 Patient will be independent with advanced/ongoing HEP to improve outcomes and carryover.  Baseline:  Goal status: MET 11/20/24  2.  Patient will report at least 50-75% improvement in low back and hip pain to improve QOL. Baseline: 2/10 hip, 4/10 back Goal status: Met 80% 11/27/24  3.  Patient will demonstrate improved functional LE strength as demonstrated by 5xSTS <20s. Baseline: 35s from chair height Goal status: Met 11.67 11/16/24  4.  Patient will be able to ambulate 500' with normal gait pattern without increased pain to access community.  Baseline:  Goal status: Progressing 12/07/24   5.  Patient will increase distance to at least 540ft Baseline: 364ft Goal status: IN PROGRESS 428ft 11/20/24   6. Patient will be able to tolerate 15 minutes of standing without initiation of back pain in order to progress upright tolerance and endurance  Baseline:  Goal Status: INITAL   PLAN:  PT FREQUENCY: 2x/week  PT DURATION: 10 weeks  PLANNED INTERVENTIONS: 97110-Therapeutic exercises, 97530- Therapeutic activity, 97112- Neuromuscular re-education, 97535- Self Care, [id number]- Manual therapy, [phone]- Gait training, and Patient/Family education  PLAN FOR NEXT SESSION: LE strengthening, balance, gait dynamics Will see next week what the MD says regarding the back   Deshundra Waller W, PT 12/07/2024, 10:16 AM

## 2024-12-10 ENCOUNTER — Ambulatory Visit: Admitting: Physical Therapy

## 2024-12-10 ENCOUNTER — Encounter: Payer: Self-pay | Admitting: Physical Therapy

## 2024-12-10 DIAGNOSIS — Z96642 Presence of left artificial hip joint: Secondary | ICD-10-CM | POA: Diagnosis not present

## 2024-12-10 DIAGNOSIS — R293 Abnormal posture: Secondary | ICD-10-CM

## 2024-12-10 DIAGNOSIS — M6281 Muscle weakness (generalized): Secondary | ICD-10-CM

## 2024-12-10 NOTE — Therapy (Signed)
 OUTPATIENT PHYSICAL THERAPY LOWER EXTREMITY TREATMENT  Progress Note Reporting Period 11/06/24 to 12/10/24  See note below for Objective Data and Assessment of Progress/Goals.     Patient Name: [name] MRN: [id number] DOB:Jan 08, 1933, 89 y.o., male Today's Date: 12/10/2024  END OF SESSION:  PT End of Session - 12/10/24 0927     Visit Number 10    Date for Recertification  01/16/24    Authorization Type Medicare    PT Start Time 0928    PT Stop Time 1015    PT Time Calculation (min) 47 min    Activity Tolerance Patient tolerated treatment well    Behavior During Therapy [facility] for tasks assessed/performed            Past Medical History:  Diagnosis Date   Atrial fibrillation [facility])    Coronary artery disease    Nov 2009-MI-stenting of the mid LAD drug-eluding stent   Hx of radiation therapy 05/15/14- 07/11/14   prostate 7800 cGy 40 sessions, seminal vesicles 5600 cGy 40 sessions   Hyperlipidemia    Hypertension    LV dysfunction    MI (myocardial infarction) (HCC) 1998   Osteopenia    Prostate CA (HCC) 05/05/2009   prostate cancer 11/09-tx with cryotherapy, Dr Chales   Past Surgical History:  Procedure Laterality Date   CARDIAC CATHETERIZATION     Nov 2009-drug eluding stent to mid LAD   HERNIA REPAIR     inguinal   IR KYPHO EA ADDL LEVEL THORACIC OR LUMBAR  04/28/2023   IR KYPHO LUMBAR INC FX REDUCE BONE BX UNI/BIL CANNULATION INC/IMAGING  04/28/2023   KYPHOPLASTY N/A 09/06/2024   Procedure: KYPHOPLASTY;  Surgeon: Burnetta Aures, MD;  Location: [facility] OR;  Service: Orthopedics;  Laterality: N/A;   PROSTATE BIOPSY  09/25/2008   gleason 4+3=7   PROSTATE CRYOABLATION  05/05/2009   Dr Chales   RIGHT/LEFT HEART CATH AND CORONARY ANGIOGRAPHY N/A 11/02/2023   Procedure: RIGHT/LEFT HEART CATH AND CORONARY ANGIOGRAPHY;  Surgeon: Jordan, Peter M, MD;  Location: [facility] INVASIVE CV LAB;  Service: Cardiovascular;  Laterality: N/A;   TOTAL HIP ARTHROPLASTY N/A 09/07/2024    Procedure: ARTHROPLASTY, HIP, TOTAL, ANTERIOR APPROACH, LEFT;  Surgeon: Kendal Franky SQUIBB, MD;  Location: [facility] OR;  Service: Orthopedics;  Laterality: N/A;  LEFT TOTAL HIP ANTERIOR   Patient Active Problem List   Diagnosis Date Noted   Presence of stent in LAD coronary artery 09/09/2024   Chronic HFrEF (heart failure with reduced ejection fraction) (HCC) 09/09/2024   Persistent atrial fibrillation (HCC) 09/09/2024   Atherosclerosis of native coronary artery of native heart without angina pectoris 09/09/2024   Preoperative clearance 09/07/2024   Closed left hip fracture (HCC) 09/06/2024   History of myocardial infarction 10/02/2020   Anticoagulated 10/02/2020   Chronic atrial fibrillation (HCC) 11/14/2013   CAD S/P percutaneous coronary angioplasty    Essential hypertension    Ischemic cardiomyopathy    Dyslipidemia, goal LDL below 70    History of prostate cancer 05/05/2009    PCP: Olam Pinal  REFERRING PROVIDER: Lauraine Moores  REFERRING DIAG:  Lt THA 9/12    THERAPY DIAG:  S/P total left hip arthroplasty  Abnormal posture  Muscle weakness (generalized)  Rationale for Evaluation and Treatment: Rehabilitation  ONSET DATE: 09/07/24  SUBJECTIVE:   SUBJECTIVE STATEMENT: I was a little sore in the hip after the last session   On the way to his kyphoplasty surgery on 09/07/24, pt experienced a fall in the hospital and following that  he required the hip surgery. Kypholasty was not done, and scheduled out after hip rehab. Soreness in the left hip but not much pain; still experiencing low level pain in the back.   PERTINENT HISTORY: See above  Hx of back sx- khyphoplasty 2024 and 2025 Total hip 2025  PAIN:  Are you having pain? Yes: NPRS scale: 0/10 L hip; 4/10 LBP Pain location: L hip;  Pain description: soreness Aggravating factors: Prolonged standing  Relieving factors: Rest  PRECAUTIONS: None  RED FLAGS: None   WEIGHT BEARING RESTRICTIONS: No; WBAT  FALLS:   Has patient fallen in last 6 months? Yes. Number of falls 1; in the hospital   LIVING ENVIRONMENT: Lives with: lives with their family and lives with their daughter Lives in: House/apartment Stairs: Yes: Internal: 6 (garage) steps; can reach both; does not use staircase to the upstairs  Has following equipment at home: Single point cane  OCCUPATION: Retired  PLOF: Needs assistance with homemaking  PATIENT GOALS: to strengthening the left leg   NEXT MD VISIT:   OBJECTIVE:  Note: Objective measures were completed at Evaluation unless otherwise noted.  DIAGNOSTIC FINDINGS: Status post left hip replacement with expected postsurgical change.  AP pelvis and lateral views of L hip show stable hip arthroplasty. No signs of any subsidence, displacement or loosening.   COGNITION: Overall cognitive status: Within functional limits for tasks assessed     SENSATION: WFL  POSTURE: rounded shoulders, forward head, and increased thoracic kyphosis   LOWER EXTREMITY ROM: full ROM BLE    LOWER EXTREMITY MMT: 4+/5 BLE    LUMBAR ROM Flexion: mid shin, pain in back Extension: 25% with pain Right lateral flexion: mid thigh Left lateral flexion: mid thigh  Right rotation: 75% some pain Left rotation: 75% some pain   FUNCTIONAL TESTS:  5 times sit to stand: 35s Timed up and go (TUG): 16s 315ft   GAIT: Distance walked: in clinic distances Assistive device utilized: Single point cane Level of assistance: Modified independence Comments: decreased hip flexion, some circumduction with LLE                                                                                                                                 TREATMENT DATE:  12/10/24 Bike level 4 x 5 minutes Tmill pushes 3 x 20 sec On airex 5# and 10# straight arm pulls On airex ball toss and bounce 35# leg curls 2x10 10# leg extension x10 5# left only extension x10 Seated ball rollouts 3 ways for back stretch Leg  press 20# 2x10, then left only 2x5 Sit to stand on airex Side step on and off airex  12/07/24 Nustep level 5 x 5 minutes Bike level 5 x 4 minutes 35# HS curls 2x10 10# leg extension 2x10, then 5# left only More square 3 blocks step overs On airex ball toss Cone toe touches solid and then on airex, some use of the [facility]  or HHA Leg press 20# bilateral and then left only 2x10 30# resisted gait all directions  12/05/24 Bike L 3 x 6 min Sit to stands holding 3lb dumbbell x10, 5lb x10 Rows blue 2x10 HS curls 35lb 2x15 Leg Ext 15lb 2x15 6in step ups x10 each Sime instability requiring min guard at times  Tmill pushes 3x30 Leg press 40lb 2x15  11/30/24 NuStep L 5 x 6 min 8in step ups from airex x10 each Sit to stand x10, holding red ball x10 HS curls 35lb 2x15 Leg Ext 10lb 2x15 On BOSU reaching  Heel raises from floor  Resisted side steps 20lb x 5 each   11/27/24 NuStep L 5 x 5 min Bike L 4 x 4 min 4in box on airex forward & lateral step ups 2x10 HS curls 35lb 2x10 Leg Ext 10lb 2x12 Shoulder Ext 5lb 2x10  Heel raises 2x10 Leg press 50lb 2x10 Supine LLE SLR and abd 2x10   11/20/24 Reasses Hip 3 way (no resistance) 2x10 Seated marches 2 1/2# 2x20 Seated hamstring curls green resistance band 2x10 Lateral step ups/overs on airex  [place] taps on airex  Airex balance (feet together, eyes closed, half tandem, tandem) STS on airex 2x10   11/16/24 NuStep L 5 x 6 min Goals   TUG 10.93  5X S2S  11.67 Leg press 30lb 2x10 HS curls 25lb 2x12 Leg Ext 2x12 6in step ups x10, min assit with LLE 20l resisted side steps Lateral 4in step ups 1 rails  1117/25 NuStep L 5 x 6 min Sit to stands 2x10 from elevated mat HS curls 25lb 2x10 Leg Ext 2x10 Bridges x10 SLR LLE x10, 2lb x10 Sideling hip abd LLE x10, 2lb x10 Standing march 2lb 2x10 1 hand for support Hip add ball squeeze 2x10 4in step ups x10 each  6inn x5 each     PATIENT EDUCATION:  Education  details: POC, HEP Person educated: Patient Education method: Medical Illustrator Education comprehension: verbalized understanding and returned demonstration  HOME EXERCISE PROGRAM: Access Code: [email or web address] URL: [email or web address] Date: 11/06/2024 Prepared by: Almetta Fam  Exercises - Standing Single Leg Stance with Counter Support  - 2 x daily - 7 x weekly - 2 sets - 10 reps - Standing March with Counter Support  - 2 x daily - 7 x weekly - 2 sets - 10 reps - Standing 3-Way Leg Reach with Resistance at Ankles and Counter Support  - 2 x daily - 7 x weekly - 2 sets - 10 reps - Supine Bridge  - 2 x daily - 7 x weekly - 2 sets - 10 reps - Sit to Stand  - 2 x daily - 7 x weekly - 2 sets - 10 reps  ASSESSMENT:  CLINICAL IMPRESSION: Pt doing well, still stiff in the AM, more back pain than hip pain now.  He stated that's his hip surgeon was very pleased will be sending note to the back surgeon and patient will meet with back surgeon on Wednesday and they will make a decision on PT for back vs surgery.  Patient has biggest issues with some fatigue but balance is the biggest issue. He will return to the MD for his back on Wednesday.  Patient is a 89 y.o. male who was seen today for physical therapy evaluation and treatment for L THA on 09/07/24. He was walking in the hospital to go in for back surgery and suffered a fall which resulted in a broken hip. Patient received home health  PT. He is progressing really well, reports low levels of pain for the hip and is able to walk without [facility] but uses it mostly for safety. Patient has a kyphotic back posture in sitting and standing. Pt exhibits circumduction of LLE during swing phase of gait and a forward trunk lean. Pt will benefit from PT to increase muscular endurance, increase quality of dynamic gait activities, and decrease pain in the back.    OBJECTIVE IMPAIRMENTS: Abnormal gait, decreased activity tolerance, decreased balance,  difficulty walking, decreased strength, postural dysfunction, and pain.   ACTIVITY LIMITATIONS: Prolonged standing, pain with trunk rotation  PARTICIPATION LIMITATIONS: assistance with household chores   PERSONAL FACTORS: Age and Time since onset of injury/illness/exacerbation are also affecting patient's functional outcome.   REHAB POTENTIAL: Good  CLINICAL DECISION MAKING: Stable/uncomplicated  EVALUATION COMPLEXITY: Low   GOALS: Goals reviewed with patient? Yes  SHORT TERM GOALS: Target date: 12/11/24  Patient will be independent with initial HEP. Baseline:  Goal status: IN PROGRESS more than half of HEP independently 11/08/24, Met 11/16/24  2.  Patient will complete TUG <12s  Baseline: 16s w/o AD  Goal status: 10.93 w/o AD Met 11/16/24   LONG TERM GOALS: Target date: 01/16/24 Patient will be independent with advanced/ongoing HEP to improve outcomes and carryover.  Baseline:  Goal status: MET 11/20/24  2.  Patient will report at least 50-75% improvement in low back and hip pain to improve QOL. Baseline: 2/10 hip, 4/10 back Goal status: Met 80% 11/27/24  3.  Patient will demonstrate improved functional LE strength as demonstrated by 5xSTS <20s. Baseline: 35s from chair height Goal status: Met 11.67 11/16/24  4.  Patient will be able to ambulate 500' with normal gait pattern without increased pain to access community.  Baseline:  Goal status: Progressing 12/07/24   5.  Patient will increase distance to at least 586ft Baseline: 354ft Goal status: IN PROGRESS 47ft 11/20/24   6. Patient will be able to tolerate 15 minutes of standing without initiation of back pain in order to progress upright tolerance and endurance  Baseline:  Goal Status: INITAL   PLAN:  PT FREQUENCY: 2x/week  PT DURATION: 10 weeks  PLANNED INTERVENTIONS: 97110-Therapeutic exercises, 97530- Therapeutic activity, 97112- Neuromuscular re-education, 97535- Self Care, [id number]- Manual  therapy, [phone]- Gait training, and Patient/Family education  PLAN FOR NEXT SESSION: LE strengthening, balance, gait dynamics Will see MD on Wednesday regarding the back   Awilda Covin W, PT 12/10/2024, 9:28 AM

## 2024-12-13 ENCOUNTER — Encounter: Payer: Self-pay | Admitting: Physical Therapy

## 2024-12-13 ENCOUNTER — Ambulatory Visit: Admitting: Physical Therapy

## 2024-12-13 DIAGNOSIS — M6281 Muscle weakness (generalized): Secondary | ICD-10-CM

## 2024-12-13 DIAGNOSIS — R293 Abnormal posture: Secondary | ICD-10-CM

## 2024-12-13 DIAGNOSIS — Z96642 Presence of left artificial hip joint: Secondary | ICD-10-CM

## 2024-12-13 NOTE — Therapy (Signed)
 OUTPATIENT PHYSICAL THERAPY LOWER EXTREMITY TREATMENT   Patient Name: [name] MRN: [id number] DOB:[birth year], 89 y.o., male Today's Date: 12/13/2024  END OF SESSION:  PT End of Session - 12/13/24 1013     Visit Number 11    Date for Recertification  01/16/24    PT Start Time 1015    PT Stop Time 1100    PT Time Calculation (min) 45 min    Activity Tolerance Patient tolerated treatment well    Behavior During Therapy [facility] for tasks assessed/performed            Past Medical History:  Diagnosis Date   Atrial fibrillation (HCC)    Coronary artery disease    Nov 2009-MI-stenting of the mid LAD drug-eluding stent   Hx of radiation therapy 05/15/14- 07/11/14   prostate 7800 cGy 40 sessions, seminal vesicles 5600 cGy 40 sessions   Hyperlipidemia    Hypertension    LV dysfunction    MI (myocardial infarction) (HCC) 1998   Osteopenia    Prostate CA (HCC) 05/05/2009   prostate cancer 11/09-tx with cryotherapy, Dr Chales   Past Surgical History:  Procedure Laterality Date   CARDIAC CATHETERIZATION     Nov 2009-drug eluding stent to mid LAD   HERNIA REPAIR     inguinal   IR KYPHO EA ADDL LEVEL THORACIC OR LUMBAR  04/28/2023   IR KYPHO LUMBAR INC FX REDUCE BONE BX UNI/BIL CANNULATION INC/IMAGING  04/28/2023   KYPHOPLASTY N/A 09/06/2024   Procedure: KYPHOPLASTY;  Surgeon: Burnetta Aures, MD;  Location: [facility] OR;  Service: Orthopedics;  Laterality: N/A;   PROSTATE BIOPSY  09/25/2008   gleason 4+3=7   PROSTATE CRYOABLATION  05/05/2009   Dr Chales   RIGHT/LEFT HEART CATH AND CORONARY ANGIOGRAPHY N/A 11/02/2023   Procedure: RIGHT/LEFT HEART CATH AND CORONARY ANGIOGRAPHY;  Surgeon: Jordan, Peter M, MD;  Location: [facility] INVASIVE CV LAB;  Service: Cardiovascular;  Laterality: N/A;   TOTAL HIP ARTHROPLASTY N/A 09/07/2024   Procedure: ARTHROPLASTY, HIP, TOTAL, ANTERIOR APPROACH, LEFT;  Surgeon: Kendal Franky SQUIBB, MD;  Location: [facility] OR;  Service: Orthopedics;  Laterality: N/A;  LEFT TOTAL  HIP ANTERIOR   Patient Active Problem List   Diagnosis Date Noted   Presence of stent in LAD coronary artery 09/09/2024   Chronic HFrEF (heart failure with reduced ejection fraction) (HCC) 09/09/2024   Persistent atrial fibrillation (HCC) 09/09/2024   Atherosclerosis of native coronary artery of native heart without angina pectoris 09/09/2024   Preoperative clearance 09/07/2024   Closed left hip fracture (HCC) 09/06/2024   History of myocardial infarction 10/02/2020   Anticoagulated 10/02/2020   Chronic atrial fibrillation (HCC) 11/14/2013   CAD S/P percutaneous coronary angioplasty    Essential hypertension    Ischemic cardiomyopathy    Dyslipidemia, goal LDL below 70    History of prostate cancer 05/05/2009    PCP: Olam Pinal  REFERRING PROVIDER: Lauraine Moores  REFERRING DIAG:  Lt THA 9/12    THERAPY DIAG:  S/P total left hip arthroplasty  Muscle weakness (generalized)  Abnormal posture  Rationale for Evaluation and Treatment: Rehabilitation  ONSET DATE: 09/07/24  SUBJECTIVE:   SUBJECTIVE STATEMENT: Back Dr Wednesday, got some x rays but, MD was not comfortable and requested a MRI. MRI in two weeks, No sure if he wasn't to try therapy for back or surgery    On the way to his kyphoplasty surgery on 09/07/24, pt experienced a fall in the hospital and following that he required the hip surgery. Kypholasty  was not done, and scheduled out after hip rehab. Soreness in the left hip but not much pain; still experiencing low level pain in the back.   PERTINENT HISTORY: See above  Hx of back sx- khyphoplasty 2024 and 2025 Total hip 2025  PAIN:  Are you having pain? Yes: NPRS scale: 0/10 L hip Pain location: L hip;  Pain description: soreness Aggravating factors: Prolonged standing  Relieving factors: Rest  PRECAUTIONS: None  RED FLAGS: None   WEIGHT BEARING RESTRICTIONS: No; WBAT  FALLS:  Has patient fallen in last 6 months? Yes. Number of falls 1; in the  hospital   LIVING ENVIRONMENT: Lives with: lives with their family and lives with their daughter Lives in: House/apartment Stairs: Yes: Internal: 6 (garage) steps; can reach both; does not use staircase to the upstairs  Has following equipment at home: Single point cane  OCCUPATION: Retired  PLOF: Needs assistance with homemaking  PATIENT GOALS: to strengthening the left leg   NEXT MD VISIT:   OBJECTIVE:  Note: Objective measures were completed at Evaluation unless otherwise noted.  DIAGNOSTIC FINDINGS: Status post left hip replacement with expected postsurgical change.  AP pelvis and lateral views of L hip show stable hip arthroplasty. No signs of any subsidence, displacement or loosening.   COGNITION: Overall cognitive status: Within functional limits for tasks assessed     SENSATION: WFL  POSTURE: rounded shoulders, forward head, and increased thoracic kyphosis   LOWER EXTREMITY ROM: full ROM BLE    LOWER EXTREMITY MMT: 4+/5 BLE    LUMBAR ROM Flexion: mid shin, pain in back Extension: 25% with pain Right lateral flexion: mid thigh Left lateral flexion: mid thigh  Right rotation: 75% some pain Left rotation: 75% some pain   FUNCTIONAL TESTS:  5 times sit to stand: 35s Timed up and go (TUG): 16s 344ft   GAIT: Distance walked: in clinic distances Assistive device utilized: Single point cane Level of assistance: Modified independence Comments: decreased hip flexion, some circumduction with LLE                                                                                                                                 TREATMENT DATE:  12/13/24 NuStep L5 x 4 min Bike L 3 x 4 min HS curls 35lb 2x15 Leg Ext 10 2x15 Standing rows & Ext 10lb 2x10 6in step ups x10 each 6in lateral step ups x10 each  12/10/24 Bike level 4 x 5 minutes Tmill pushes 3 x 20 sec On airex 5# and 10# straight arm pulls On airex ball toss and bounce 35# leg curls  2x10 10# leg extension x10 5# left only extension x10 Seated ball rollouts 3 ways for back stretch Leg press 20# 2x10, then left only 2x5 Sit to stand on airex Side step on and off airex  12/07/24 Nustep level 5 x 5 minutes Bike level 5 x 4 minutes 35# HS curls 2x10 10#  leg extension 2x10, then 5# left only More square 3 blocks step overs On airex ball toss Cone toe touches solid and then on airex, some use of the [facility] or HHA Leg press 20# bilateral and then left only 2x10 30# resisted gait all directions  12/05/24 Bike L 3 x 6 min Sit to stands holding 3lb dumbbell x10, 5lb x10 Rows blue 2x10 HS curls 35lb 2x15 Leg Ext 15lb 2x15 6in step ups x10 each Sime instability requiring min guard at times  Tmill pushes 3x30 Leg press 40lb 2x15  11/30/24 NuStep L 5 x 6 min 8in step ups from airex x10 each Sit to stand x10, holding red ball x10 HS curls 35lb 2x15 Leg Ext 10lb 2x15 On BOSU reaching  Heel raises from floor  Resisted side steps 20lb x 5 each   11/27/24 NuStep L 5 x 5 min Bike L 4 x 4 min 4in box on airex forward & lateral step ups 2x10 HS curls 35lb 2x10 Leg Ext 10lb 2x12 Shoulder Ext 5lb 2x10  Heel raises 2x10 Leg press 50lb 2x10 Supine LLE SLR and abd 2x10   11/20/24 Reasses Hip 3 way (no resistance) 2x10 Seated marches 2 1/2# 2x20 Seated hamstring curls green resistance band 2x10 Lateral step ups/overs on airex  [place] taps on airex  Airex balance (feet together, eyes closed, half tandem, tandem) STS on airex 2x10   11/16/24 NuStep L 5 x 6 min Goals   TUG 10.93  5X S2S  11.67 Leg press 30lb 2x10 HS curls 25lb 2x12 Leg Ext 2x12 6in step ups x10, min assit with LLE 20l resisted side steps Lateral 4in step ups 1 rails  1117/25 NuStep L 5 x 6 min Sit to stands 2x10 from elevated mat HS curls 25lb 2x10 Leg Ext 2x10 Bridges x10 SLR LLE x10, 2lb x10 Sideling hip abd LLE x10, 2lb x10 Standing march 2lb 2x10 1 hand for support Hip  add ball squeeze 2x10 4in step ups x10 each  6inn x5 each     PATIENT EDUCATION:  Education details: POC, HEP Person educated: Patient Education method: Medical Illustrator Education comprehension: verbalized understanding and returned demonstration  HOME EXERCISE PROGRAM: Access Code: [email or web address] URL: [email or web address] Date: 11/06/2024 Prepared by: Almetta Fam  Exercises - Standing Single Leg Stance with Counter Support  - 2 x daily - 7 x weekly - 2 sets - 10 reps - Standing March with Counter Support  - 2 x daily - 7 x weekly - 2 sets - 10 reps - Standing 3-Way Leg Reach with Resistance at Ankles and Counter Support  - 2 x daily - 7 x weekly - 2 sets - 10 reps - Supine Bridge  - 2 x daily - 7 x weekly - 2 sets - 10 reps - Sit to Stand  - 2 x daily - 7 x weekly - 2 sets - 10 reps  ASSESSMENT:  CLINICAL IMPRESSION: Pt enters doing well. MD wants to wait until MRI to determine if he needs therapy vs back surgery. Spoke to lead PT, will skip a week and continues once a week until we get further instruction for PT. Some instability today when trying shoulder ext and rows on airex, completed intervention without airex.   Patient is a 89 y.o. male who was seen today for physical therapy evaluation and treatment for L THA on 09/07/24. He was walking in the hospital to go in for back surgery and suffered a fall which resulted in a  broken hip. Patient received home health PT. He is progressing really well, reports low levels of pain for the hip and is able to walk without [facility] but uses it mostly for safety. Patient has a kyphotic back posture in sitting and standing. Pt exhibits circumduction of LLE during swing phase of gait and a forward trunk lean. Pt will benefit from PT to increase muscular endurance, increase quality of dynamic gait activities, and decrease pain in the back.    OBJECTIVE IMPAIRMENTS: Abnormal gait, decreased activity tolerance, decreased balance,  difficulty walking, decreased strength, postural dysfunction, and pain.   ACTIVITY LIMITATIONS: Prolonged standing, pain with trunk rotation  PARTICIPATION LIMITATIONS: assistance with household chores   PERSONAL FACTORS: Age and Time since onset of injury/illness/exacerbation are also affecting patient's functional outcome.   REHAB POTENTIAL: Good  CLINICAL DECISION MAKING: Stable/uncomplicated  EVALUATION COMPLEXITY: Low   GOALS: Goals reviewed with patient? Yes  SHORT TERM GOALS: Target date: 12/11/24  Patient will be independent with initial HEP. Baseline:  Goal status: IN PROGRESS more than half of HEP independently 11/08/24, Met 11/16/24  2.  Patient will complete TUG <12s  Baseline: 16s w/o AD  Goal status: 10.93 w/o AD Met 11/16/24   LONG TERM GOALS: Target date: 01/16/24 Patient will be independent with advanced/ongoing HEP to improve outcomes and carryover.  Baseline:  Goal status: MET 11/20/24  2.  Patient will report at least 50-75% improvement in low back and hip pain to improve QOL. Baseline: 2/10 hip, 4/10 back Goal status: Met 80% 11/27/24  3.  Patient will demonstrate improved functional LE strength as demonstrated by 5xSTS <20s. Baseline: 35s from chair height Goal status: Met 11.67 11/16/24  4.  Patient will be able to ambulate 500' with normal gait pattern without increased pain to access community.  Baseline:  Goal status: Progressing 12/07/24   5.  Patient will increase distance to at least 538ft Baseline: 341ft Goal status: IN PROGRESS 431ft 11/20/24   6. Patient will be able to tolerate 15 minutes of standing without initiation of back pain in order to progress upright tolerance and endurance  Baseline:  Goal Status: INITAL   PLAN:  PT FREQUENCY: 2x/week  PT DURATION: 10 weeks  PLANNED INTERVENTIONS: 97110-Therapeutic exercises, 97530- Therapeutic activity, 97112- Neuromuscular re-education, 97535- Self Care, [id number]- Manual  therapy, [phone]- Gait training, and Patient/Family education  PLAN FOR NEXT SESSION: LE strengthening, balance, gait dynamics Will see MD on Wednesday regarding the back   Tanda KANDICE Sorrow, PTA 12/13/2024, 10:13 AM

## 2025-01-01 ENCOUNTER — Ambulatory Visit: Attending: Student | Admitting: Physical Therapy

## 2025-01-01 DIAGNOSIS — Z96642 Presence of left artificial hip joint: Secondary | ICD-10-CM | POA: Diagnosis present

## 2025-01-01 DIAGNOSIS — M6281 Muscle weakness (generalized): Secondary | ICD-10-CM | POA: Insufficient documentation

## 2025-01-01 NOTE — Therapy (Signed)
 " OUTPATIENT PHYSICAL THERAPY LOWER EXTREMITY TREATMENT   Patient Name: [name] MRN: [id number] DOB:Aug 15, 1933, 89 y.o., male Today's Date: 01/01/2025  END OF SESSION:  PT End of Session - 01/01/25 0926     Visit Number 12    Date for Recertification  01/16/24    Authorization Type Medicare    PT Start Time 0926    PT Stop Time 1008    PT Time Calculation (min) 42 min            Past Medical History:  Diagnosis Date   Atrial fibrillation [facility])    Coronary artery disease    Nov 2009-MI-stenting of the mid LAD drug-eluding stent   Hx of radiation therapy 05/15/14- 07/11/14   prostate 7800 cGy 40 sessions, seminal vesicles 5600 cGy 40 sessions   Hyperlipidemia    Hypertension    LV dysfunction    MI (myocardial infarction) (HCC) 1998   Osteopenia    Prostate CA (HCC) 05/05/2009   prostate cancer 11/09-tx with cryotherapy, Dr Chales   Past Surgical History:  Procedure Laterality Date   CARDIAC CATHETERIZATION     Nov 2009-drug eluding stent to mid LAD   HERNIA REPAIR     inguinal   IR KYPHO EA ADDL LEVEL THORACIC OR LUMBAR  04/28/2023   IR KYPHO LUMBAR INC FX REDUCE BONE BX UNI/BIL CANNULATION INC/IMAGING  04/28/2023   KYPHOPLASTY N/A 09/06/2024   Procedure: KYPHOPLASTY;  Surgeon: Burnetta Aures, MD;  Location: [facility] OR;  Service: Orthopedics;  Laterality: N/A;   PROSTATE BIOPSY  09/25/2008   gleason 4+3=7   PROSTATE CRYOABLATION  05/05/2009   Dr Chales   RIGHT/LEFT HEART CATH AND CORONARY ANGIOGRAPHY N/A 11/02/2023   Procedure: RIGHT/LEFT HEART CATH AND CORONARY ANGIOGRAPHY;  Surgeon: Jordan, Peter M, MD;  Location: [facility] INVASIVE CV LAB;  Service: Cardiovascular;  Laterality: N/A;   TOTAL HIP ARTHROPLASTY N/A 09/07/2024   Procedure: ARTHROPLASTY, HIP, TOTAL, ANTERIOR APPROACH, LEFT;  Surgeon: Kendal Franky SQUIBB, MD;  Location: [facility] OR;  Service: Orthopedics;  Laterality: N/A;  LEFT TOTAL HIP ANTERIOR   Patient Active Problem List   Diagnosis Date Noted   Presence of  stent in LAD coronary artery 09/09/2024   Chronic HFrEF (heart failure with reduced ejection fraction) (HCC) 09/09/2024   Persistent atrial fibrillation (HCC) 09/09/2024   Atherosclerosis of native coronary artery of native heart without angina pectoris 09/09/2024   Preoperative clearance 09/07/2024   Closed left hip fracture (HCC) 09/06/2024   History of myocardial infarction 10/02/2020   Anticoagulated 10/02/2020   Chronic atrial fibrillation (HCC) 11/14/2013   CAD S/P percutaneous coronary angioplasty    Essential hypertension    Ischemic cardiomyopathy    Dyslipidemia, goal LDL below 70    History of prostate cancer 05/05/2009    PCP: Olam Pinal  REFERRING PROVIDER: Lauraine Moores  REFERRING DIAG:  Lt THA 9/12    THERAPY DIAG:  S/P total left hip arthroplasty  Muscle weakness (generalized)  Rationale for Evaluation and Treatment: Rehabilitation  ONSET DATE: 09/07/24  SUBJECTIVE:   SUBJECTIVE STATEMENT: Hip is doing real well, 1 more therapy appt for hip then see MD for back and they will decide what to do based off MRI from this past Sunday   On the way to his kyphoplasty surgery on 09/07/24, pt experienced a fall in the hospital and following that he required the hip surgery. Kypholasty was not done, and scheduled out after hip rehab. Soreness in the left hip but not much pain;  still experiencing low level pain in the back.   PERTINENT HISTORY: See above  Hx of back sx- khyphoplasty 2024 and 2025 Total hip 2025  PAIN:  Are you having pain? Yes: NPRS scale: 0/10 L hip Pain location: L hip;  Pain description: soreness Aggravating factors: Prolonged standing  Relieving factors: Rest  PRECAUTIONS: None  RED FLAGS: None   WEIGHT BEARING RESTRICTIONS: No; WBAT  FALLS:  Has patient fallen in last 6 months? Yes. Number of falls 1; in the hospital   LIVING ENVIRONMENT: Lives with: lives with their family and lives with their daughter Lives in:  House/apartment Stairs: Yes: Internal: 6 (garage) steps; can reach both; does not use staircase to the upstairs  Has following equipment at home: Single point cane  OCCUPATION: Retired  PLOF: Needs assistance with homemaking  PATIENT GOALS: to strengthening the left leg   NEXT MD VISIT:   OBJECTIVE:  Note: Objective measures were completed at Evaluation unless otherwise noted.  DIAGNOSTIC FINDINGS: Status post left hip replacement with expected postsurgical change.  AP pelvis and lateral views of L hip show stable hip arthroplasty. No signs of any subsidence, displacement or loosening.   COGNITION: Overall cognitive status: Within functional limits for tasks assessed     SENSATION: WFL  POSTURE: rounded shoulders, forward head, and increased thoracic kyphosis   LOWER EXTREMITY ROM: full ROM BLE    LOWER EXTREMITY MMT: 4+/5 BLE    LUMBAR ROM Flexion: mid shin, pain in back Extension: 25% with pain Right lateral flexion: mid thigh Left lateral flexion: mid thigh  Right rotation: 75% some pain Left rotation: 75% some pain   FUNCTIONAL TESTS:  5 times sit to stand: 35s Timed up and go (TUG): 16s 362ft   GAIT: Distance walked: in clinic distances Assistive device utilized: Single point cane Level of assistance: Modified independence Comments: decreased hip flexion, some circumduction with LLE                                                                                                                                 TREATMENT DATE:   01/01/25 Nustep L 5 HS curls 35l# 2 x15 Leg Ext 10 2x15 Seated row 20# 2 sets 10 Lat pull down 20# 2 sets 10 Black tband trunk ext 2 sets 10 STS on airex 10 x STS with wt ball chest press 10 x 6 inch step up with opp leg ext 10x with UE support   12/13/24 NuStep L5 x 4 min Bike L 3 x 4 min HS curls 35lb 2x15 Leg Ext 10 2x15 Standing rows & Ext 10lb 2x10 6in step ups x10 each 6in lateral step ups x10  each  12/10/24 Bike level 4 x 5 minutes Tmill pushes 3 x 20 sec On airex 5# and 10# straight arm pulls On airex ball toss and bounce 35# leg curls 2x10 10# leg extension x10 5# left only extension x10 Seated ball  rollouts 3 ways for back stretch Leg press 20# 2x10, then left only 2x5 Sit to stand on airex Side step on and off airex  12/07/24 Nustep level 5 x 5 minutes Bike level 5 x 4 minutes 35# HS curls 2x10 10# leg extension 2x10, then 5# left only More square 3 blocks step overs On airex ball toss Cone toe touches solid and then on airex, some use of the [facility] or HHA Leg press 20# bilateral and then left only 2x10 30# resisted gait all directions  12/05/24 Bike L 3 x 6 min Sit to stands holding 3lb dumbbell x10, 5lb x10 Rows blue 2x10 HS curls 35lb 2x15 Leg Ext 15lb 2x15 6in step ups x10 each Sime instability requiring min guard at times  Tmill pushes 3x30 Leg press 40lb 2x15  11/30/24 NuStep L 5 x 6 min 8in step ups from airex x10 each Sit to stand x10, holding red ball x10 HS curls 35lb 2x15 Leg Ext 10lb 2x15 On BOSU reaching  Heel raises from floor  Resisted side steps 20lb x 5 each   11/27/24 NuStep L 5 x 5 min Bike L 4 x 4 min 4in box on airex forward & lateral step ups 2x10 HS curls 35lb 2x10 Leg Ext 10lb 2x12 Shoulder Ext 5lb 2x10  Heel raises 2x10 Leg press 50lb 2x10 Supine LLE SLR and abd 2x10   11/20/24 Reasses Hip 3 way (no resistance) 2x10 Seated marches 2 1/2# 2x20 Seated hamstring curls green resistance band 2x10 Lateral step ups/overs on airex  [place] taps on airex  Airex balance (feet together, eyes closed, half tandem, tandem) STS on airex 2x10   11/16/24 NuStep L 5 x 6 min Goals   TUG 10.93  5X S2S  11.67 Leg press 30lb 2x10 HS curls 25lb 2x12 Leg Ext 2x12 6in step ups x10, min assit with LLE 20l resisted side steps Lateral 4in step ups 1 rails  1117/25 NuStep L 5 x 6 min Sit to stands 2x10 from elevated  mat HS curls 25lb 2x10 Leg Ext 2x10 Bridges x10 SLR LLE x10, 2lb x10 Sideling hip abd LLE x10, 2lb x10 Standing march 2lb 2x10 1 hand for support Hip add ball squeeze 2x10 4in step ups x10 each  6inn x5 each     PATIENT EDUCATION:  Education details: POC, HEP Person educated: Patient Education method: Medical Illustrator Education comprehension: verbalized understanding and returned demonstration  HOME EXERCISE PROGRAM: Access Code: [email or web address] URL: [email or web address] Date: 11/06/2024 Prepared by: Almetta Fam  Exercises - Standing Single Leg Stance with Counter Support  - 2 x daily - 7 x weekly - 2 sets - 10 reps - Standing March with Counter Support  - 2 x daily - 7 x weekly - 2 sets - 10 reps - Standing 3-Way Leg Reach with Resistance at Ankles and Counter Support  - 2 x daily - 7 x weekly - 2 sets - 10 reps - Supine Bridge  - 2 x daily - 7 x weekly - 2 sets - 10 reps - Sit to Stand  - 2 x daily - 7 x weekly - 2 sets - 10 reps  ASSESSMENT:  CLINICAL IMPRESSION:  Hip is doing real well, 1 more therapy appt for hip then see MD for back and they will decide what to do based off MRI from this past Sunday Assessed goals and doing very well overall with hip. Progressed strength and balance with cuing and assist as needed  Patient is a 89 y.o. male who was seen today for physical therapy evaluation and treatment for L THA on 09/07/24. He was walking in the hospital to go in for back surgery and suffered a fall which resulted in a broken hip. Patient received home health PT. He is progressing really well, reports low levels of pain for the hip and is able to walk without [facility] but uses it mostly for safety. Patient has a kyphotic back posture in sitting and standing. Pt exhibits circumduction of LLE during swing phase of gait and a forward trunk lean. Pt will benefit from PT to increase muscular endurance, increase quality of dynamic gait activities, and  decrease pain in the back.    OBJECTIVE IMPAIRMENTS: Abnormal gait, decreased activity tolerance, decreased balance, difficulty walking, decreased strength, postural dysfunction, and pain.   ACTIVITY LIMITATIONS: Prolonged standing, pain with trunk rotation  PARTICIPATION LIMITATIONS: assistance with household chores   PERSONAL FACTORS: Age and Time since onset of injury/illness/exacerbation are also affecting patient's functional outcome.   REHAB POTENTIAL: Good  CLINICAL DECISION MAKING: Stable/uncomplicated  EVALUATION COMPLEXITY: Low   GOALS: Goals reviewed with patient? Yes  SHORT TERM GOALS: Target date: 12/11/24  Patient will be independent with initial HEP. Baseline:  Goal status: IN PROGRESS more than half of HEP independently 11/08/24, Met 11/16/24  2.  Patient will complete TUG <12s  Baseline: 16s w/o AD  Goal status: 10.93 w/o AD Met 11/16/24   LONG TERM GOALS: Target date: 01/16/24 Patient will be independent with advanced/ongoing HEP to improve outcomes and carryover.  Baseline:  Goal status: MET 11/20/24  2.  Patient will report at least 50-75% improvement in low back and hip pain to improve QOL. Baseline: 2/10 hip, 4/10 back Goal status: Met 80% 11/27/24  3.  Patient will demonstrate improved functional LE strength as demonstrated by 5xSTS <20s. Baseline: 35s from chair height Goal status: Met 11.67 11/16/24  4.  Patient will be able to ambulate 500' with normal gait pattern without increased pain to access community.  Baseline:  Goal status: Progressing 12/07/24    5.  Patient will increase distance to at least 535ft Baseline: 377ft Goal status: IN PROGRESS 420ft 11/20/24   01/01/25 MET  6. Patient will be able to tolerate 15 minutes of standing without initiation of back pain in order to progress upright tolerance and endurance  Baseline:  Goal Status: walking 2 miles with minimal standing rest only for back not hip MET 01/01/25   PLAN:  PT  FREQUENCY: 2x/week  PT DURATION: 10 weeks  PLANNED INTERVENTIONS: 97110-Therapeutic exercises, 97530- Therapeutic activity, 97112- Neuromuscular re-education, 97535- Self Care, 02859- Manual therapy, [phone]- Gait training, and Patient/Family education  PLAN FOR NEXT SESSION: D/C next session for hip. Will f/u with back doctor and will decide PT or surgery Delfino Friesen,ANGIE, PTA 01/01/2025, 9:58 AM  "

## 2025-01-08 ENCOUNTER — Ambulatory Visit: Admitting: Physical Therapy

## 2025-01-08 DIAGNOSIS — Z96642 Presence of left artificial hip joint: Secondary | ICD-10-CM | POA: Diagnosis not present

## 2025-01-08 DIAGNOSIS — M6281 Muscle weakness (generalized): Secondary | ICD-10-CM

## 2025-01-08 NOTE — Therapy (Signed)
 " OUTPATIENT PHYSICAL THERAPY LOWER EXTREMITY TREATMENT   Patient Name: [name] MRN: [id number] DOB:1933-03-12, 89 y.o., male Today's Date: 01/08/2025  END OF SESSION:  PT End of Session - 01/08/25 0927     Visit Number 13    Date for Recertification  01/16/24    Authorization Type Medicare    PT Start Time 0927    PT Stop Time 1010    PT Time Calculation (min) 43 min            Past Medical History:  Diagnosis Date   Atrial fibrillation [facility])    Coronary artery disease    Nov 2009-MI-stenting of the mid LAD drug-eluding stent   Hx of radiation therapy 05/15/14- 07/11/14   prostate 7800 cGy 40 sessions, seminal vesicles 5600 cGy 40 sessions   Hyperlipidemia    Hypertension    LV dysfunction    MI (myocardial infarction) (HCC) 1998   Osteopenia    Prostate CA (HCC) 05/05/2009   prostate cancer 11/09-tx with cryotherapy, Dr Chales   Past Surgical History:  Procedure Laterality Date   CARDIAC CATHETERIZATION     Nov 2009-drug eluding stent to mid LAD   HERNIA REPAIR     inguinal   IR KYPHO EA ADDL LEVEL THORACIC OR LUMBAR  04/28/2023   IR KYPHO LUMBAR INC FX REDUCE BONE BX UNI/BIL CANNULATION INC/IMAGING  04/28/2023   KYPHOPLASTY N/A 09/06/2024   Procedure: KYPHOPLASTY;  Surgeon: Burnetta Aures, MD;  Location: [facility] OR;  Service: Orthopedics;  Laterality: N/A;   PROSTATE BIOPSY  09/25/2008   gleason 4+3=7   PROSTATE CRYOABLATION  05/05/2009   Dr Chales   RIGHT/LEFT HEART CATH AND CORONARY ANGIOGRAPHY N/A 11/02/2023   Procedure: RIGHT/LEFT HEART CATH AND CORONARY ANGIOGRAPHY;  Surgeon: Jordan, Peter M, MD;  Location: [facility] INVASIVE CV LAB;  Service: Cardiovascular;  Laterality: N/A;   TOTAL HIP ARTHROPLASTY N/A 09/07/2024   Procedure: ARTHROPLASTY, HIP, TOTAL, ANTERIOR APPROACH, LEFT;  Surgeon: Kendal Franky SQUIBB, MD;  Location: [facility] OR;  Service: Orthopedics;  Laterality: N/A;  LEFT TOTAL HIP ANTERIOR   Patient Active Problem List   Diagnosis Date Noted   Presence of  stent in LAD coronary artery 09/09/2024   Chronic HFrEF (heart failure with reduced ejection fraction) (HCC) 09/09/2024   Persistent atrial fibrillation (HCC) 09/09/2024   Atherosclerosis of native coronary artery of native heart without angina pectoris 09/09/2024   Preoperative clearance 09/07/2024   Closed left hip fracture (HCC) 09/06/2024   History of myocardial infarction 10/02/2020   Anticoagulated 10/02/2020   Chronic atrial fibrillation (HCC) 11/14/2013   CAD S/P percutaneous coronary angioplasty    Essential hypertension    Ischemic cardiomyopathy    Dyslipidemia, goal LDL below 70    History of prostate cancer 05/05/2009    PCP: Olam Pinal  REFERRING PROVIDER: Lauraine Moores  REFERRING DIAG:  Lt THA 9/12    THERAPY DIAG:  S/P total left hip arthroplasty  Muscle weakness (generalized)  Rationale for Evaluation and Treatment: Rehabilitation  ONSET DATE: 09/07/24  SUBJECTIVE:   SUBJECTIVE STATEMENT: Okay with D/C for hip and we will see what MD says re: back  On the way to his kyphoplasty surgery on 09/07/24, pt experienced a fall in the hospital and following that he required the hip surgery. Kypholasty was not done, and scheduled out after hip rehab. Soreness in the left hip but not much pain; still experiencing low level pain in the back.   PERTINENT HISTORY: See above  Hx of  back sx- khyphoplasty 2024 and 2025 Total hip 2025  PAIN:  Are you having pain? Yes: NPRS scale: 0/10 L hip Pain location: L hip;  Pain description: soreness Aggravating factors: Prolonged standing  Relieving factors: Rest  PRECAUTIONS: None  RED FLAGS: None   WEIGHT BEARING RESTRICTIONS: No; WBAT  FALLS:  Has patient fallen in last 6 months? Yes. Number of falls 1; in the hospital   LIVING ENVIRONMENT: Lives with: lives with their family and lives with their daughter Lives in: House/apartment Stairs: Yes: Internal: 6 (garage) steps; can reach both; does not use staircase  to the upstairs  Has following equipment at home: Single point cane  OCCUPATION: Retired  PLOF: Needs assistance with homemaking  PATIENT GOALS: to strengthening the left leg   NEXT MD VISIT:   OBJECTIVE:  Note: Objective measures were completed at Evaluation unless otherwise noted.  DIAGNOSTIC FINDINGS: Status post left hip replacement with expected postsurgical change.  AP pelvis and lateral views of L hip show stable hip arthroplasty. No signs of any subsidence, displacement or loosening.   COGNITION: Overall cognitive status: Within functional limits for tasks assessed     SENSATION: WFL  POSTURE: rounded shoulders, forward head, and increased thoracic kyphosis   LOWER EXTREMITY ROM: full ROM BLE    LOWER EXTREMITY MMT: 4+/5 BLE    LUMBAR ROM Flexion: mid shin, pain in back Extension: 25% with pain Right lateral flexion: mid thigh Left lateral flexion: mid thigh  Right rotation: 75% some pain Left rotation: 75% some pain   FUNCTIONAL TESTS:  5 times sit to stand: 35s Timed up and go (TUG): 16s 330ft   GAIT: Distance walked: in clinic distances Assistive device utilized: Single point cane Level of assistance: Modified independence Comments: decreased hip flexion, some circumduction with LLE                                                                                                                                 TREATMENT DATE:   01/08/25 Nustep L 5 HS curls 35l# 2 x15 Leg Ext 10 2x15 Seated row 20# 2 sets 10 Lat pull down 20# 2 sets 10 Black tband trunk ext 2 sets 10 STS on airex 10 x 2 sets with wt ball chest press 6 inch step up with opp leg ext 10x with UE support 6 inch lateral step up with leg abd 10 x with UE support Black bar heel raise and toe raise   01/01/25 Nustep L 5 HS curls 35l# 2 x15 Leg Ext 10 2x15 Seated row 20# 2 sets 10 Lat pull down 20# 2 sets 10 Black tband trunk ext 2 sets 10 STS on airex 10 x STS  with wt ball chest press 10 x 6 inch step up with opp leg ext 10x with UE support   12/13/24 NuStep L5 x 4 min Bike L 3 x 4 min HS curls 35lb 2x15 Leg  Ext 10 2x15 Standing rows & Ext 10lb 2x10 6in step ups x10 each 6in lateral step ups x10 each  12/10/24 Bike level 4 x 5 minutes Tmill pushes 3 x 20 sec On airex 5# and 10# straight arm pulls On airex ball toss and bounce 35# leg curls 2x10 10# leg extension x10 5# left only extension x10 Seated ball rollouts 3 ways for back stretch Leg press 20# 2x10, then left only 2x5 Sit to stand on airex Side step on and off airex  12/07/24 Nustep level 5 x 5 minutes Bike level 5 x 4 minutes 35# HS curls 2x10 10# leg extension 2x10, then 5# left only More square 3 blocks step overs On airex ball toss Cone toe touches solid and then on airex, some use of the [facility] or [facility] Leg press 20# bilateral and then left only 2x10 30# resisted gait all directions  12/05/24 Bike L 3 x 6 min Sit to stands holding 3lb dumbbell x10, 5lb x10 Rows blue 2x10 HS curls 35lb 2x15 Leg Ext 15lb 2x15 6in step ups x10 each Sime instability requiring min guard at times  Tmill pushes 3x30 Leg press 40lb 2x15  11/30/24 NuStep L 5 x 6 min 8in step ups from airex x10 each Sit to stand x10, holding red ball x10 HS curls 35lb 2x15 Leg Ext 10lb 2x15 On BOSU reaching  Heel raises from floor  Resisted side steps 20lb x 5 each   11/27/24 NuStep L 5 x 5 min Bike L 4 x 4 min 4in box on airex forward & lateral step ups 2x10 HS curls 35lb 2x10 Leg Ext 10lb 2x12 Shoulder Ext 5lb 2x10  Heel raises 2x10 Leg press 50lb 2x10 Supine LLE SLR and abd 2x10   11/20/24 Reasses Hip 3 way (no resistance) 2x10 Seated marches 2 1/2# 2x20 Seated hamstring curls green resistance band 2x10 Lateral step ups/overs on airex  [place] taps on airex  Airex balance (feet together, eyes closed, half tandem, tandem) STS on airex 2x10   11/16/24 NuStep L 5 x 6  min Goals   TUG 10.93  5X S2S  11.67 Leg press 30lb 2x10 HS curls 25lb 2x12 Leg Ext 2x12 6in step ups x10, min assit with LLE 20l resisted side steps Lateral 4in step ups 1 rails  1117/25 NuStep L 5 x 6 min Sit to stands 2x10 from elevated mat HS curls 25lb 2x10 Leg Ext 2x10 Bridges x10 SLR LLE x10, 2lb x10 Sideling hip abd LLE x10, 2lb x10 Standing march 2lb 2x10 1 hand for support Hip add ball squeeze 2x10 4in step ups x10 each  6inn x5 each     PATIENT EDUCATION:  Education details: POC, HEP Person educated: Patient Education method: Medical Illustrator Education comprehension: verbalized understanding and returned demonstration  HOME EXERCISE PROGRAM: Access Code: [email or web address] URL: [email or web address] Date: 11/06/2024 Prepared by: Almetta Fam  Exercises - Standing Single Leg Stance with Counter Support  - 2 x daily - 7 x weekly - 2 sets - 10 reps - Standing March with Counter Support  - 2 x daily - 7 x weekly - 2 sets - 10 reps - Standing 3-Way Leg Reach with Resistance at Ankles and Counter Support  - 2 x daily - 7 x weekly - 2 sets - 10 reps - Supine Bridge  - 2 x daily - 7 x weekly - 2 sets - 10 reps - Sit to Stand  - 2 x daily - 7 x weekly -  2 sets - 10 reps  ASSESSMENT:  CLINICAL IMPRESSION: Hip is good and ready for D/C for that. Back is okay today and we will see what MD says about MRIs. All goals met. Worked on func strength and balance.   Patient is a 89 y.o. male who was seen today for physical therapy evaluation and treatment for L THA on 09/07/24. He was walking in the hospital to go in for back surgery and suffered a fall which resulted in a broken hip. Patient received home health PT. He is progressing really well, reports low levels of pain for the hip and is able to walk without [facility] but uses it mostly for safety. Patient has a kyphotic back posture in sitting and standing. Pt exhibits circumduction of LLE during swing phase  of gait and a forward trunk lean. Pt will benefit from PT to increase muscular endurance, increase quality of dynamic gait activities, and decrease pain in the back.    OBJECTIVE IMPAIRMENTS: Abnormal gait, decreased activity tolerance, decreased balance, difficulty walking, decreased strength, postural dysfunction, and pain.   ACTIVITY LIMITATIONS: Prolonged standing, pain with trunk rotation  PARTICIPATION LIMITATIONS: assistance with household chores   PERSONAL FACTORS: Age and Time since onset of injury/illness/exacerbation are also affecting patient's functional outcome.   REHAB POTENTIAL: Good  CLINICAL DECISION MAKING: Stable/uncomplicated  EVALUATION COMPLEXITY: Low   GOALS: Goals reviewed with patient? Yes  SHORT TERM GOALS: Target date: 12/11/24  Patient will be independent with initial HEP. Baseline:  Goal status: IN PROGRESS more than half of HEP independently 11/08/24, Met 11/16/24  2.  Patient will complete TUG <12s  Baseline: 16s w/o AD  Goal status: 10.93 w/o AD Met 11/16/24   LONG TERM GOALS: Target date: 01/16/24 Patient will be independent with advanced/ongoing HEP to improve outcomes and carryover.  Baseline:  Goal status: MET 11/20/24  2.  Patient will report at least 50-75% improvement in low back and hip pain to improve QOL. Baseline: 2/10 hip, 4/10 back Goal status: Met 80% 11/27/24  3.  Patient will demonstrate improved functional LE strength as demonstrated by 5xSTS <20s. Baseline: 35s from chair height Goal status: Met 11.67 11/16/24  4.  Patient will be able to ambulate 500' with normal gait pattern without increased pain to access community.  Baseline:  Goal status: Progressing 12/07/24 . MET 01/08/25   5.  Patient will increase distance to at least 5110ft Baseline: 329ft Goal status: IN PROGRESS 467ft 11/20/24   01/01/25 MET  6. Patient will be able to tolerate 15 minutes of standing without initiation of back pain in order to progress  upright tolerance and endurance  Baseline:  Goal Status: walking 2 miles with minimal standing rest only for back not hip MET 01/01/25   PLAN:  PT FREQUENCY: 2x/week  PT DURATION: 10 weeks  PLANNED INTERVENTIONS: 97110-Therapeutic exercises, 97530- Therapeutic activity, 97112- Neuromuscular re-education, 97535- Self Care, [id number]- Manual therapy, [phone]- Gait training, and Patient/Family education  PLAN FOR NEXT SESSION: D/C . May return for back after seeing MD and will need new script and pt is aware.  PHYSICAL THERAPY DISCHARGE SUMMARY   Patient agrees to discharge. Patient goals were met. Patient is being discharged due to meeting the stated rehab goals.     Almetta Fam, PT, DPT [place], PTA 01/08/2025, 9:28 AM  "

## 2025-01-31 NOTE — Progress Notes (Unsigned)
 "  [name] Date of Birth: [birth year]   History of Present Illness: [name] is seen today for followup CHF and increased edema.  He has a history of an anterior myocardial infarction in November 2009. This was treated with a drug-eluting stent to the mid LAD.  He had a stress Myoview  study November 2012. He is able to walk for 8 minutes. He had no clinical symptoms. Images showed a fixed anterior septal and apical defect. There was no ischemia. Ejection fraction was 44%.   In November 2014 he was found to be in atrial fibrillation with a controlled response. He was started on Xarelto . Echo showed EF 40-45% with moderate biatrial enlargement. Treated with rate control and anticoagulation.   He was recently seen in the ED on 09/05/23 and treated for community acquired PNA. Troponins normal. Ecg without acute change. Afib controlled. BNP was elevated 449. This was repeated on 9/17 and was 466. No old values for comparison. Echo done showing EF 35-40%. Moderate pulmonary HTN with RV systolic pressure estimated at 54 mm Hg. Severe biatrial enlargement. Moderate MR. He had potassium of 3.4 in ED. This was repleted and on repeat was 5.7 at PCP.   We performed a cardiac CT PET to evaluate further and this was interpreted as high risk. Cardiac cath showed nonobstructive disease except for a small PDA. He had mild pulmonary HTN with normal LV filling pressures.  Repeat Echo recently showed no change in EF but improvement in pulmonary pressures and RV function.   On Sept 11 He was scheduled for kyphoplasty for L5 compression fracture but had a fall with fracture of his left hip. He underwent left THR. Also placed on steroids for cervical radiculopathy. Noted significant increase in LE edema after hospitalization and lasix  was resumed.  On follow  up today LE edema has resolved completely. No longer on steroids. Still getting PT twice a week. Hoping to graduate from walker. Did not feel well on gabapentin   and this was stopped.     Current Outpatient Medications on File Prior to Visit  Medication Sig Dispense Refill   Cholecalciferol  (VITAMIN D3) 2000 UNITS TABS Take 2,000 Int'l Units by mouth every evening.     dapagliflozin  propanediol (FARXIGA ) 10 MG TABS tablet Take 1 tablet (10 mg total) by mouth daily before breakfast. 90 tablet 3   furosemide  (LASIX ) 20 MG tablet Take 1 tablet (20 mg total) by mouth daily as needed for fluid or edema. 90 tablet 3   methocarbamol  (ROBAXIN ) 500 MG tablet Take 1-2 tablets (500-1,000 mg total) by mouth every 6 (six) hours as needed. 60 tablet 2   polyethylene glycol (MIRALAX ) 17 g packet Take 17 g by mouth daily as needed for severe constipation. 14 each 0   rivaroxaban  (XARELTO ) 20 MG TABS tablet TAKE 1 TABLET BY MOUTH DAILY WITH SUPPER 90 tablet 3   sacubitril -valsartan  (ENTRESTO ) 97-103 MG Take 1 tablet by mouth 2 (two) times daily. 180 tablet 3   simvastatin  (ZOCOR ) 40 MG tablet TAKE 1 TABLET BY MOUTH AT BEDTIME 90 tablet 3   spironolactone  (ALDACTONE ) 25 MG tablet Take 0.5 tablets (12.5 mg total) by mouth daily. 45 tablet 3   No current facility-administered medications on file prior to visit.    No Known Allergies  Past Medical History:  Diagnosis Date   Atrial fibrillation [facility])    Coronary artery disease    Nov 2009-MI-stenting of the mid LAD drug-eluding stent   Hx of radiation therapy 05/15/14- 07/11/14  prostate 7800 cGy 40 sessions, seminal vesicles 5600 cGy 40 sessions   Hyperlipidemia    Hypertension    LV dysfunction    MI (myocardial infarction) (HCC) 1998   Osteopenia    Prostate CA (HCC) 05/05/2009   prostate cancer 11/09-tx with cryotherapy, Dr Chales    Past Surgical History:  Procedure Laterality Date   CARDIAC CATHETERIZATION     Nov 2009-drug eluding stent to mid LAD   HERNIA REPAIR     inguinal   IR KYPHO EA ADDL LEVEL THORACIC OR LUMBAR  04/28/2023   IR KYPHO LUMBAR INC FX REDUCE BONE BX UNI/BIL CANNULATION  INC/IMAGING  04/28/2023   KYPHOPLASTY N/A 09/06/2024   Procedure: KYPHOPLASTY;  Surgeon: Burnetta Aures, MD;  Location: [facility] OR;  Service: Orthopedics;  Laterality: N/A;   PROSTATE BIOPSY  09/25/2008   gleason 4+3=7   PROSTATE CRYOABLATION  05/05/2009   Dr Chales   RIGHT/LEFT HEART CATH AND CORONARY ANGIOGRAPHY N/A 11/02/2023   Procedure: RIGHT/LEFT HEART CATH AND CORONARY ANGIOGRAPHY;  Surgeon: Railee Bonillas M, MD;  Location: [facility] INVASIVE CV LAB;  Service: Cardiovascular;  Laterality: N/A;   TOTAL HIP ARTHROPLASTY N/A 09/07/2024   Procedure: ARTHROPLASTY, HIP, TOTAL, ANTERIOR APPROACH, LEFT;  Surgeon: Kendal Franky SQUIBB, MD;  Location: [facility] OR;  Service: Orthopedics;  Laterality: N/A;  LEFT TOTAL HIP ANTERIOR    Social History   Tobacco Use  Smoking Status Never  Smokeless Tobacco Never    Social History   Substance and Sexual Activity  Alcohol Use No    Family History  Problem Relation Age of Onset   Heart disease Mother    Heart failure Mother    Heart disease Brother    Hematuria Father    Cancer Father        prostate   Heart disease Sister     Review of Systems: As noted in history of present illness.  All other systems were reviewed and are negative.  Physical Exam: There were no vitals taken for this visit. GENERAL:  Well appearing elderly WM in NAD HEENT:  PERRL, EOMI, sclera are clear. Oropharynx is clear. NECK:  no JVD. carotid upstroke brisk and symmetric, no bruits, no thyromegaly or adenopathy LUNGS:  Clear to auscultation bilaterally CHEST:  Unremarkable HEART:  IRRR,  PMI not displaced or sustained,S1 and S2 within normal limits, no S3, no S4: no clicks, no rubs, no murmurs ABD:  Soft, nontender. BS +, no masses or bruits. No hepatomegaly, no splenomegaly EXT:  2 + pulses throughout, No edema, no cyanosis no clubbing SKIN:  Warm and dry.  No rashes NEURO:  Alert and oriented x 3. Cranial nerves II through XII intact. PSYCH:  Cognitively intact   LABORATORY  DATA: Lab Results  Component Value Date   WBC 14.6 (H) 09/10/2024   HGB 11.3 (L) 09/10/2024   HCT 33.9 (L) 09/10/2024   PLT 85 (L) 09/10/2024   GLUCOSE 138 (H) 09/10/2024   CHOL 166 05/22/2024   TRIG 99 05/22/2024   HDL 68 05/22/2024   LDLCALC 80 05/22/2024   ALT 18 09/08/2024   AST 30 09/08/2024   NA 140 09/10/2024   K 4.5 09/10/2024   CL 106 09/10/2024   CREATININE 1.02 09/10/2024   BUN 23 09/10/2024   CO2 26 09/10/2024   TSH 2.450 05/22/2024   INR 1.1 10/26/2023   Labs dated 10/04/19: cholesterol 145, triglycerides 75, HDL 68, LDL 61. CBC and chemistries normal Dated 10/21/20: cholesterol 141, triglycerides 64, HDL 71,  LDL 56. CBC normal Dated 11/17/20: potassium 5.4 otherwise CMET normal. Dated 11/03/21: cholesterol 139, triglycerides 64, HDL 71, LDL 55. CMET and CBC normal. Dated 11/10/22: cholesterol 145, triglycerides 63, HDL 71, LDL 61. CBC and CMET normal Dated 09/12/23: potassium 5.7 otherwise BMET normal Dated 09/18/24: normal blood count. Macrocytic indices but B12 and folate normal. TSH normal.           Echo 09/16/23: IMPRESSIONS     1. Left ventricular ejection fraction, by estimation, is 35 to 40%. The  left ventricle has moderately decreased function. The left ventricle  demonstrates regional wall motion abnormalities (see scoring  diagram/findings for description). The left  ventricular internal cavity size was moderately dilated. Left ventricular  diastolic function could not be evaluated. There is disproportionately  severe hypokinesis in the apex and mid-apical anterior and septal wlls.   2. Right ventricular systolic function is moderately reduced. The right  ventricular size is mildly enlarged. There is moderately elevated  pulmonary artery systolic pressure. The estimated right ventricular  systolic pressure is 53.9 mmHg.   3. Left atrial size was severely dilated.   4. Right atrial size was severely dilated.   5. The mitral valve is normal in  structure. Moderate mitral valve  regurgitation. No evidence of mitral stenosis.   6. The aortic valve is tricuspid. There is mild calcification of the  aortic valve. There is mild thickening of the aortic valve. Aortic valve  regurgitation is not visualized. Aortic valve sclerosis/calcification is  present, without any evidence of  aortic stenosis.   7. Aortic dilatation noted. There is borderline dilatation of the aortic  root, measuring 38 mm. There is borderline dilatation of the ascending  aorta, measuring 38 mm.   8. The inferior vena cava is dilated in size with <50% respiratory  variability, suggesting right atrial pressure of 15 mmHg.   Comparison(s): Prior images unable to be directly viewed, comparison made  by report only.   PET CT 10/12/23:   There is a medium size, severe, fixed defect present in the apical septum and apex consistent with prior LAD infarction. There is a largely fixed, medium size, severe defect present in the basal to mid lateral wall consistent with infarction in the LCX territory. LVEF is moderately reduced and increases slightly with stress (32%->35%). MBF is not accurate due to prior revascularization. The findings are consistent with the patient's known LAD infarction, but the lateral infarct appears new. There is no ischemia on this study. High-risk study based on new infarction and moderately reduced LVEF. Findings suggest ischemic cardiomyopathy.   LV perfusion is abnormal. There is no evidence of ischemia. There is evidence of infarction. Defect 1: There is a medium defect with severe reduction in uptake present in the apical to mid septal and apex location(s) that is fixed. There is abnormal wall motion in the defect area. Consistent with infarction. Defect 2: There is a medium defect with severe reduction in uptake present in the mid to basal lateral location(s) that is fixed. There is abnormal wall motion in the defect area. Consistent with infarction.    Rest left ventricular function is abnormal. Rest global function is moderately reduced. There were multiple regional abnormalities. Rest EF: 32%. Stress left ventricular function is abnormal. Stress global function is moderately reduced. There were multiple regional abnormalities. Stress EF: 34%. End diastolic cavity size is mildly enlarged.   Myocardial blood flow reserve is not reported in this patient due to technical or patient-specific concerns that affect  accuracy.   Coronary calcium assessment not performed due to prior revascularization.   Findings are consistent with infarction. The study is high risk.   Electronically signed by Darryle Decent, MD ____________________________________________  Cardiac cath 11/02/23:  RIGHT/LEFT HEART CATH AND CORONARY ANGIOGRAPHY   Conclusion  Single vessel obstructive disease involving the PDA. This is not significantly changed from 2009. Widely patent LAD stent. Otherwise nonobstructive disease Normal LV filling pressures. LVEDP 11 mm Hg. PCWP 13/14 with mean 14 mm Hg Mild pulmonary HTN. PAP 43/21 mean 26 mm Hg. RA pressure normal 6 mm Hg Cardiac output 4.2 L/min, index 2.23.    Plan: recommend optimizing CHF therapy.    Diagnostic Dominance: Right  Intervention  Echo 07/30/24: IMPRESSIONS     1. Left ventricular ejection fraction, by estimation, is 35 to 40%. The  left ventricle has moderately decreased function. The left ventricle  demonstrates global hypokinesis. There is mild left ventricular  hypertrophy of the basal-septal segment. Left  ventricular diastolic function could not be evaluated.   2. Right ventricular systolic function is mildly reduced. The right  ventricular size is normal. There is normal pulmonary artery systolic  pressure.   3. Left atrial size was severely dilated.   4. The mitral valve is degenerative. Moderate mitral valve regurgitation.  No evidence of mitral stenosis.   5. The aortic valve is tricuspid.  Aortic valve regurgitation is not  visualized. Aortic valve sclerosis/calcification is present, without any  evidence of aortic stenosis.   6. Aortic dilatation noted. There is mild dilatation of the ascending  aorta, measuring 39 mm.   7. The inferior vena cava is normal in size with greater than 50%  respiratory variability, suggesting right atrial pressure of 3 mmHg.    Assessment / Plan: 1. Coronary disease with remote anterior myocardial infarction 2009 treated with drug-eluting stent to the LAD. Recent cardiac cath in November showed patent LAD stent. No obstructive disease in LCx or ramus. Chronic obstruction in small PDA. Continue medical therapy. No angina.  2. Hypertension, well controlled  3. Hypercholesterolemia, on Zocor . LDL 80.   4. Chronic combined systolic/diastolic CHF. Left ventricular dysfunction. Ejection fraction has decreased to 35%. BNP elevated. Cardiac cath showed normal LV filling pressures and cardiac output. Currently on Entresto , aldactone , Farxiga . Back on lasix . No evidence of volume overload. Weight is stable. Metoprolol  discontinued duet to fatigue. continue Entresto  to 97/103 mg bid and  Farxiga  10 mg daily.  Continue low dose aldactone . Repeat Echo recently showed no significant change in EF 35-40%. I would recommend taking lasix  only PRN for swelling or weight gain  5. Atrial fibrillation. Rate is controlled on no medication.  Patient is  asymptomatic. Chad score is 3. Continue Xarelto  20 mg daily.   6. Vertebral compression fracture. Planned  vertebroplasty but fell and fractured hip  7. S/p THR   Follow up in 4 months.   "

## 2025-02-04 ENCOUNTER — Ambulatory Visit: Admitting: Cardiology
